# Patient Record
Sex: Male | Born: 1950 | Race: White | Hispanic: No | Marital: Single | Smoking: Current every day smoker
Health system: Southern US, Community
[De-identification: ages and names within clinical notes are randomized; demographics above are authoritative.]

## PROBLEM LIST (undated history)

## (undated) DIAGNOSIS — E119 Type 2 diabetes mellitus without complications: Secondary | ICD-10-CM

## (undated) DIAGNOSIS — Z86718 Personal history of other venous thrombosis and embolism: Secondary | ICD-10-CM

## (undated) DIAGNOSIS — E785 Hyperlipidemia, unspecified: Secondary | ICD-10-CM

## (undated) DIAGNOSIS — I82409 Acute embolism and thrombosis of unspecified deep veins of unspecified lower extremity: Secondary | ICD-10-CM

## (undated) HISTORY — DX: Type 2 diabetes mellitus without complications: E11.9

## (undated) HISTORY — DX: Hyperlipidemia, unspecified: E78.5

## (undated) HISTORY — DX: Acute embolism and thrombosis of unspecified deep veins of unspecified lower extremity: I82.409

## (undated) HISTORY — PX: TONSILLECTOMY: SUR1361

## (undated) HISTORY — DX: Personal history of other venous thrombosis and embolism: Z86.718

---

## 2011-03-27 ENCOUNTER — Emergency Department: Payer: Self-pay | Admitting: *Deleted

## 2011-03-31 ENCOUNTER — Emergency Department: Payer: Self-pay | Admitting: Internal Medicine

## 2011-09-02 ENCOUNTER — Ambulatory Visit: Payer: Self-pay

## 2012-07-10 IMAGING — CR DG CHEST 2V
1 series · 2 of 2 positions shown · non-contrast
Comparison: none

REASON FOR EXAM: sob
COMMENTS:   May transport without cardiac monitor

[Series 1: view not recorded · 0.17mm/px · 2 of 2 slices shown]
[im 1/2]
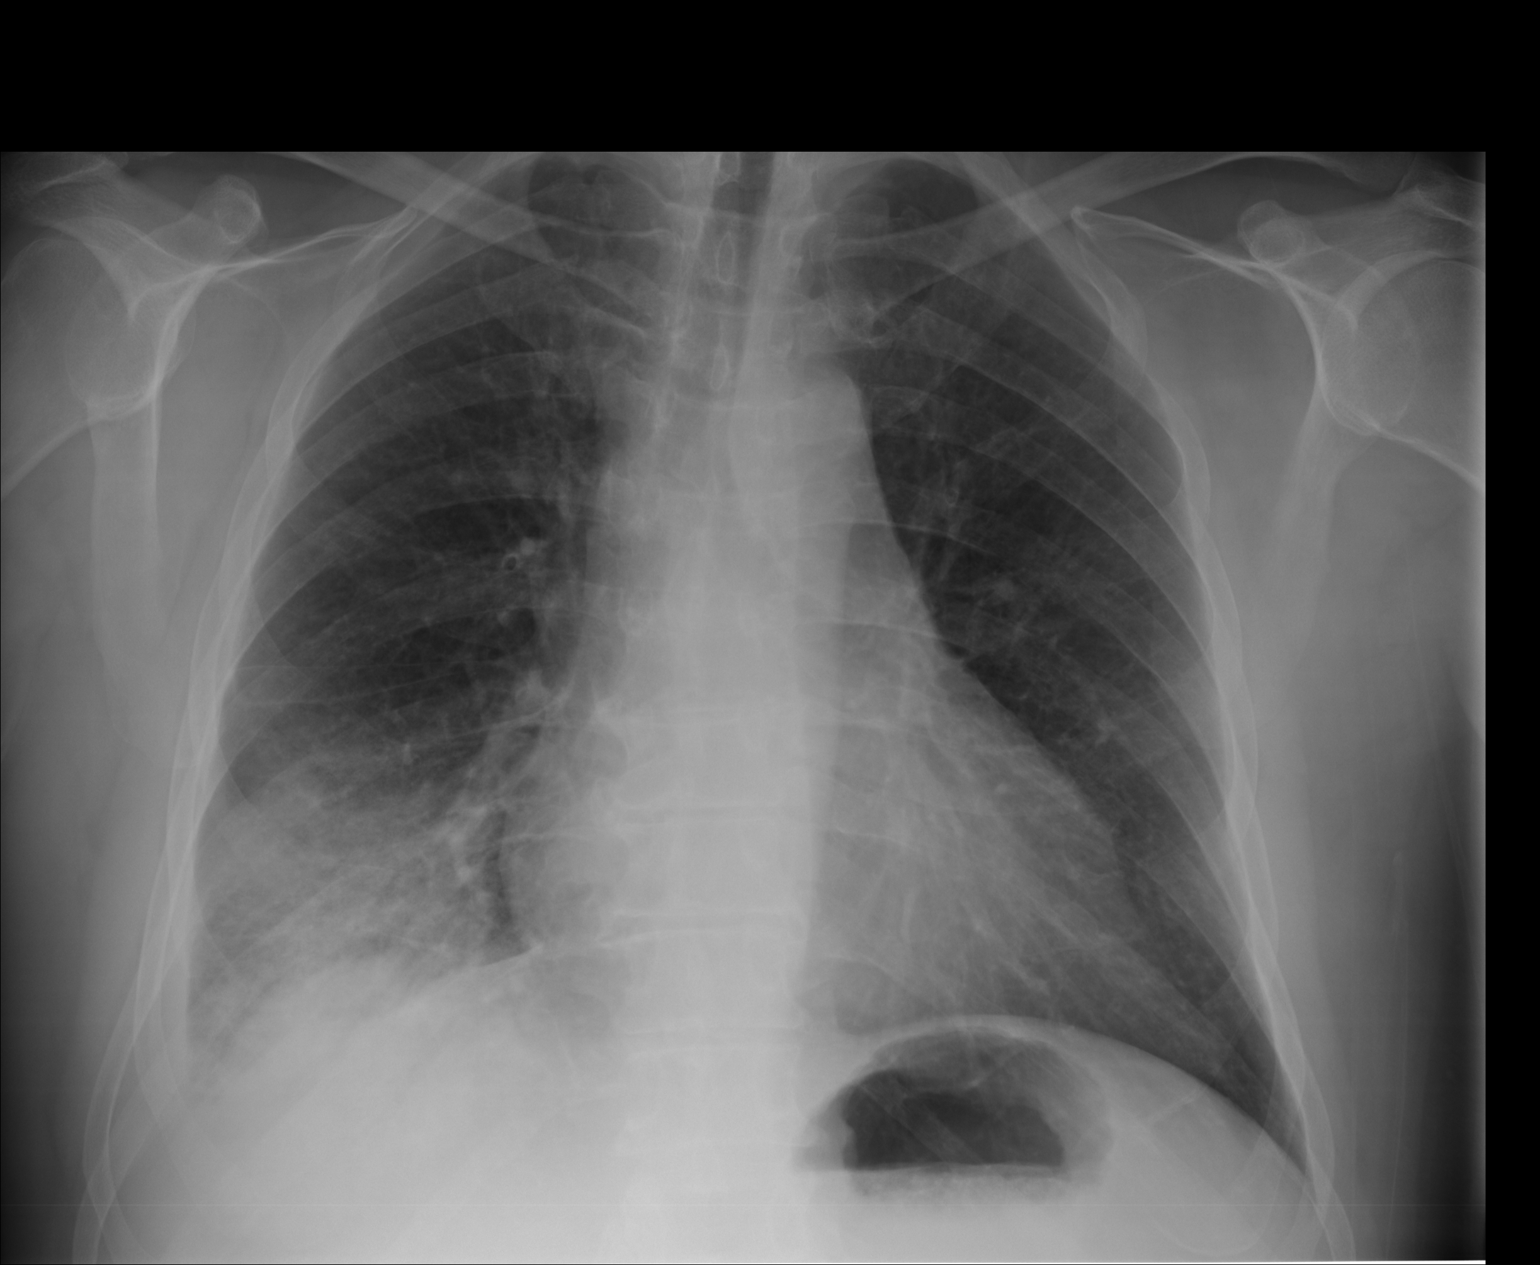
[im 2/2]
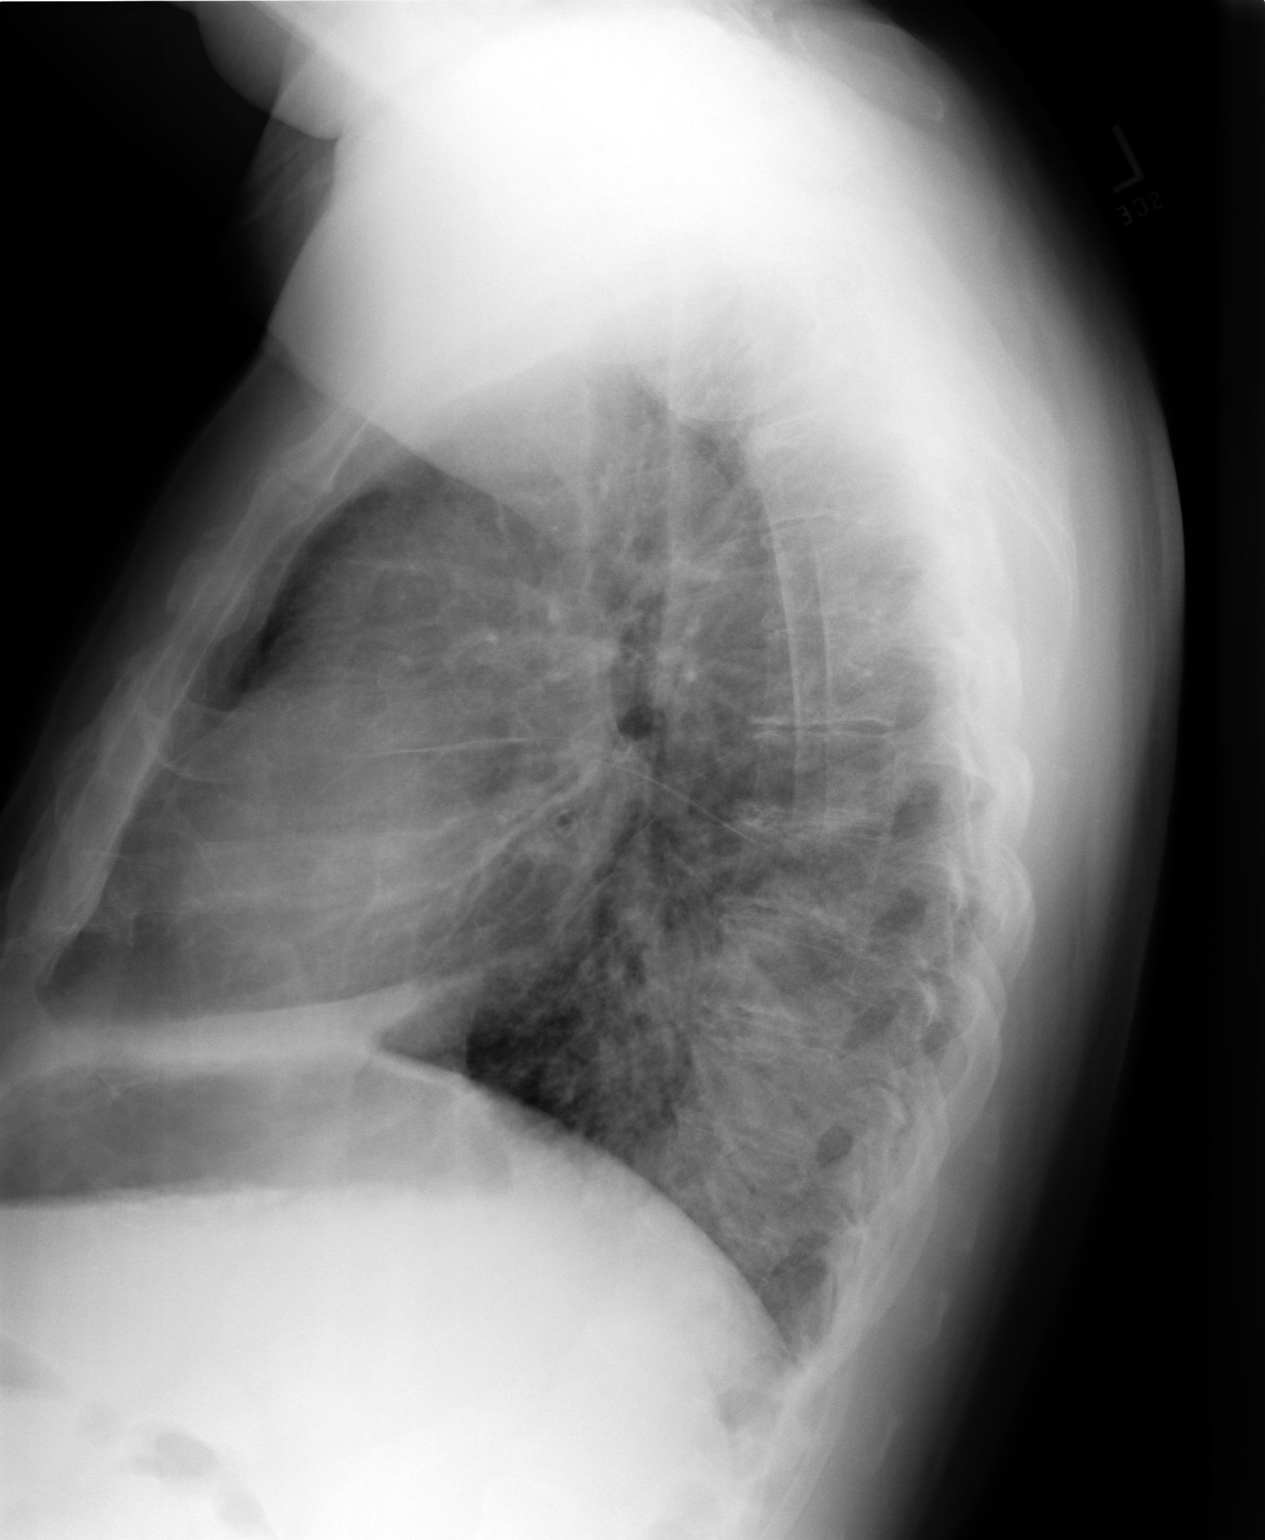

[2 of 2 positions shown; findings below may reference images not displayed]

PROCEDURE:     DXR - DXR CHEST PA (OR AP) AND LATERAL  - March 31, 2011  [DATE]

RESULT:     Comparison is made to the prior exam of 03/27/2011.

There is again noted a consolidated infiltrate in the right lower lobe
compatible with pneumonia. The infiltrate appears slightly less dense on the
prior exam. No new infiltrates are seen. The minimal left lower lobe
infiltrate has now cleared. The heart size is upper limits for normal but
stable as compared to the prior exam.
IMPRESSION: 1.  There is improvement in the previously noted right lower lobe infiltrate
compatible with pneumonia.
2.  The left lower lobe infiltrate has now cleared.
3.  In the current exam there is noted increased density at the right base
that likely represents effusion associated with the right lower lobe
infiltrate already mentioned.
4.  No new pulmonary infiltrates are seen.

## 2015-01-23 ENCOUNTER — Other Ambulatory Visit: Payer: Self-pay | Admitting: Family Medicine

## 2015-02-25 ENCOUNTER — Other Ambulatory Visit: Payer: Self-pay | Admitting: Family Medicine

## 2015-03-16 ENCOUNTER — Ambulatory Visit (INDEPENDENT_AMBULATORY_CARE_PROVIDER_SITE_OTHER): Payer: Self-pay | Admitting: Family Medicine

## 2015-03-16 ENCOUNTER — Encounter: Payer: Self-pay | Admitting: Family Medicine

## 2015-03-16 VITALS — BP 100/63 | HR 76 | Temp 98.0°F | Resp 16 | Ht 73.0 in | Wt 228.0 lb

## 2015-03-16 DIAGNOSIS — I10 Essential (primary) hypertension: Secondary | ICD-10-CM

## 2015-03-16 DIAGNOSIS — E1169 Type 2 diabetes mellitus with other specified complication: Secondary | ICD-10-CM | POA: Insufficient documentation

## 2015-03-16 DIAGNOSIS — E118 Type 2 diabetes mellitus with unspecified complications: Secondary | ICD-10-CM | POA: Insufficient documentation

## 2015-03-16 DIAGNOSIS — E119 Type 2 diabetes mellitus without complications: Secondary | ICD-10-CM

## 2015-03-16 DIAGNOSIS — E785 Hyperlipidemia, unspecified: Secondary | ICD-10-CM

## 2015-03-16 LAB — POCT GLYCOSYLATED HEMOGLOBIN (HGB A1C): Hemoglobin A1C: 6.4

## 2015-03-16 MED ORDER — LISINOPRIL 10 MG PO TABS: 10.0000 mg | ORAL_TABLET | Freq: Every day | ORAL | Status: DC

## 2015-03-16 NOTE — Progress Notes (Signed)
Name: Corey Howard   MRN: [id number]    DOB: July 18, 1951   Date:03/16/2015       Progress Note  Subjective  Chief Complaint  Chief Complaint  Patient presents with  . Diabetes    HPI  Here for f/u of DM, HBP, elevated lipids.  He has been walking some extra, but diet still needs improvement.  Has gained 5# since last visit. No problem-specific assessment & plan notes found for this encounter.   Past Medical History  Diagnosis Date  . Hypertension   . Hyperlipidemia   . Diabetes mellitus without complication     History reviewed. No pertinent past surgical history.  Family History  Problem Relation Age of Onset  . Heart attack Mother     History   Social History  . Marital Status: Single    Spouse Name: N/A  . Number of Children: N/A  . Years of Education: N/A   Occupational History  . Not on file.   Social History Main Topics  . Smoking status: Current Every Day Smoker -- 0.50 packs/day    Types: Cigarettes  . Smokeless tobacco: Never Used  . Alcohol Use: No  . Drug Use: No  . Sexual Activity: Not on file   Other Topics Concern  . Not on file   Social History Narrative  . No narrative on file     Current outpatient prescriptions:  .  aspirin 81 MG tablet, Take 81 mg by mouth daily., Disp: , Rfl:  .  atorvastatin (LIPITOR) 20 MG tablet, Take 20 mg by mouth daily., Disp: , Rfl:  .  glimepiride (AMARYL) 2 MG tablet, Take 2 mg by mouth daily with breakfast., Disp: , Rfl:  .  lisinopril (PRINIVIL,ZESTRIL) 20 MG tablet, Take 20 mg by mouth daily., Disp: , Rfl:  .  metFORMIN (GLUCOPHAGE) 500 MG tablet, Take 500 mg by mouth 2 (two) times daily with a meal., Disp: , Rfl:   Allergies  Allergen Reactions  . Penicillin G Other (See Comments)     Review of Systems  Constitutional: Negative for fever, chills, weight loss and malaise/fatigue.  HENT: Negative for hearing loss and nosebleeds.   Eyes: Negative for blurred vision and double vision.  Respiratory:  Negative for cough, sputum production, shortness of breath and wheezing.   Cardiovascular: Negative for chest pain, palpitations, orthopnea and leg swelling.  Gastrointestinal: Negative for heartburn, nausea, vomiting, abdominal pain, diarrhea and blood in stool.  Genitourinary: Negative for dysuria, urgency and frequency.  Musculoskeletal: Negative for myalgias and joint pain.  Skin: Negative for rash.  Neurological: Negative for dizziness, tingling, sensory change, focal weakness, weakness and headaches.  Psychiatric/Behavioral: Negative for depression. The patient is not nervous/anxious.       Objective  Filed Vitals:   03/16/15 0937  BP: 100/63  Pulse: 76  Temp: 98 F (36.7 C)  Resp: 16  Height:  (1.854 m)  Weight: 228 lb (103.42 kg)    Physical Exam  Constitutional: He is oriented to person, place, and time and well-developed, well-nourished, and in no distress. No distress.  HENT:  Head: Normocephalic and atraumatic.  Eyes: Conjunctivae and EOM are normal. Pupils are equal, round, and reactive to light. No scleral icterus.  Neck: Normal range of motion. Neck supple. No thyromegaly present.  Cardiovascular: Normal rate, regular rhythm, normal heart sounds and intact distal pulses.  Exam reveals no gallop and no friction rub.   No murmur heard. Pulmonary/Chest: Effort normal and breath sounds normal.  No respiratory distress. He has no wheezes. He has no rales.  Abdominal: Soft. Bowel sounds are normal. He exhibits no distension and no mass. There is no tenderness.  Musculoskeletal: Normal range of motion. He exhibits no edema.  Lymphadenopathy:    He has no cervical adenopathy.  Neurological: He is alert and oriented to person, place, and time.  Vitals reviewed.      No results found for this or any previous visit (from the past 2160 hour(s)).   Assessment & Plan  Problem List Items Addressed This Visit      Cardiovascular and Mediastinum   Hypertension    Relevant Medications   atorvastatin (LIPITOR) 20 MG tablet   aspirin 81 MG tablet   lisinopril (PRINIVIL,ZESTRIL) 20 MG tablet     Endocrine   Diabetes mellitus - Primary   Relevant Medications   glimepiride (AMARYL) 2 MG tablet   atorvastatin (LIPITOR) 20 MG tablet   metFORMIN (GLUCOPHAGE) 500 MG tablet   aspirin 81 MG tablet   lisinopril (PRINIVIL,ZESTRIL) 20 MG tablet   Other Relevant Orders   POCT HgB A1C     Other   Hyperlipemia   Relevant Medications   atorvastatin (LIPITOR) 20 MG tablet   aspirin 81 MG tablet   lisinopril (PRINIVIL,ZESTRIL) 20 MG tablet      Meds ordered this encounter  Medications  . glimepiride (AMARYL) 2 MG tablet    Sig: Take 2 mg by mouth daily with breakfast.  . atorvastatin (LIPITOR) 20 MG tablet    Sig: Take 20 mg by mouth daily.  . metFORMIN (GLUCOPHAGE) 500 MG tablet    Sig: Take 500 mg by mouth 2 (two) times daily with a meal.  . aspirin 81 MG tablet    Sig: Take 81 mg by mouth daily.  Marland Kitchen lisinopril (PRINIVIL,ZESTRIL) 20 MG tablet    Sig: Take 20 mg by mouth daily.   1. Type 2 diabetes mellitus without complication  - POCT HgB A1C-6.4  2. Essential hypertension  - lisinopril (PRINIVIL,ZESTRIL) 10 MG tablet; Take 1 tablet (10 mg total) by mouth daily.  Dispense: 90 tablet; Refill: 3  3. Hyperlipemia

## 2015-03-16 NOTE — Patient Instructions (Signed)
Continue other meds except for the reduced dose of Lisinopril.    Lose weight and reduce calories.

## 2015-05-09 ENCOUNTER — Ambulatory Visit (INDEPENDENT_AMBULATORY_CARE_PROVIDER_SITE_OTHER): Payer: Self-pay

## 2015-05-09 DIAGNOSIS — Z23 Encounter for immunization: Secondary | ICD-10-CM

## 2015-06-04 ENCOUNTER — Other Ambulatory Visit: Payer: Self-pay | Admitting: Family Medicine

## 2015-06-20 ENCOUNTER — Ambulatory Visit (INDEPENDENT_AMBULATORY_CARE_PROVIDER_SITE_OTHER): Payer: Self-pay | Admitting: Family Medicine

## 2015-06-20 ENCOUNTER — Encounter: Payer: Self-pay | Admitting: Family Medicine

## 2015-06-20 VITALS — BP 117/71 | HR 73 | Temp 97.9°F | Resp 16 | Ht 74.0 in | Wt 230.0 lb

## 2015-06-20 DIAGNOSIS — E119 Type 2 diabetes mellitus without complications: Secondary | ICD-10-CM

## 2015-06-20 DIAGNOSIS — I10 Essential (primary) hypertension: Secondary | ICD-10-CM

## 2015-06-20 DIAGNOSIS — E785 Hyperlipidemia, unspecified: Secondary | ICD-10-CM

## 2015-06-20 LAB — POCT GLYCOSYLATED HEMOGLOBIN (HGB A1C): Hemoglobin A1C: 7.4

## 2015-06-20 MED ORDER — LISINOPRIL 5 MG PO TABS: 5.0000 mg | ORAL_TABLET | Freq: Every day | ORAL | Status: DC

## 2015-06-20 MED ORDER — GLIMEPIRIDE 4 MG PO TABS: 4.0000 mg | ORAL_TABLET | Freq: Every day | ORAL | Status: DC

## 2015-06-20 NOTE — Progress Notes (Signed)
Name: Corey Howard D Fausto   MRN: [id number]    DOB: 08/16/1950   Date:06/20/2015       Progress Note  Subjective  Chief Complaint  Chief Complaint  Patient presents with  . Diabetes    Diabetes Pertinent negatives for hypoglycemia include no dizziness, headaches or tremors. Pertinent negatives for diabetes include no blurred vision, no chest pain, no weakness and no weight loss.   Here for f/u of DM.  He has gained weight.  He stopped Lisinopril about 3 days ago because of fear about kidney failure.   Fasting BSs running 115-130s when checked.  No problem-specific assessment & plan notes found for this encounter.   Past Medical History  Diagnosis Date  . Hypertension   . Hyperlipidemia   . Diabetes mellitus without complication [facility](HCC)     Social History  Substance Use Topics  . Smoking status: Current Every Day Smoker -- 0.50 packs/day    Types: Cigarettes  . Smokeless tobacco: Never Used  . Alcohol Use: No     Current outpatient prescriptions:  .  aspirin 81 MG tablet, Take 81 mg by mouth daily., Disp: , Rfl:  .  atorvastatin (LIPITOR) 20 MG tablet, TAKE ONE TABLET BY MOUTH AT BEDTIME, Disp: 30 tablet, Rfl: 12 .  glimepiride (AMARYL) 2 MG tablet, Take 2 mg by mouth daily with breakfast., Disp: , Rfl:  .  metFORMIN (GLUCOPHAGE) 500 MG tablet, TAKE TWO TABLETS BY MOUTH TWICE DAILY, Disp: 120 tablet, Rfl: 12 .  lisinopril (PRINIVIL,ZESTRIL) 10 MG tablet, Take 1 tablet (10 mg total) by mouth daily. (Patient not taking: Reported on 06/20/2015), Disp: 90 tablet, Rfl: 3  Allergies  Allergen Reactions  . Penicillin G Other (See Comments)    Review of Systems  Constitutional: Negative for fever, chills, weight loss and malaise/fatigue.  HENT: Negative for hearing loss.   Eyes: Negative for blurred vision and double vision.  Respiratory: Negative for cough, sputum production, shortness of breath and wheezing.   Cardiovascular: Negative for chest pain, palpitations and leg swelling.   Gastrointestinal: Negative for heartburn, abdominal pain and blood in stool.  Genitourinary: Negative for dysuria, urgency and frequency.  Musculoskeletal: Negative for myalgias and joint pain.  Skin: Negative for rash.  Neurological: Negative for dizziness, tremors, weakness and headaches.      Objective  Filed Vitals:   06/20/15 0835  BP: 117/71  Pulse: 73  Temp: 97.9 F (36.6 C)  TempSrc: Oral  Resp: 16  Height: 6\' 2"  (1.88 m)  Weight: 230 lb (104.327 kg)     Physical Exam  Constitutional: He is oriented to person, place, and time and well-developed, well-nourished, and in no distress. No distress.  HENT:  Head: Normocephalic and atraumatic.  Eyes: Conjunctivae and EOM are normal. Pupils are equal, round, and reactive to light. No scleral icterus.  Neck: Normal range of motion. Neck supple. Carotid bruit is not present. No thyromegaly present.  Cardiovascular: Normal rate, regular rhythm, normal heart sounds and intact distal pulses.  Exam reveals no gallop and no friction rub.   No murmur heard. Pulmonary/Chest: Effort normal and breath sounds normal. No respiratory distress. He has no wheezes. He has no rales.  Abdominal: Soft. Bowel sounds are normal. He exhibits no distension, no abdominal bruit and no mass. There is no tenderness.  Musculoskeletal: He exhibits no edema.  Lymphadenopathy:    He has no cervical adenopathy.  Neurological: He is alert and oriented to person, place, and time.  Vitals reviewed.  No results found for this or any previous visit (from the past 2160 hour(s)).   Assessment & Plan  1. Type 2 diabetes mellitus without complication, without long-term current use of insulin (HCC)  - POCT HgB A1C-7.4 - lisinopril (PRINIVIL,ZESTRIL) 5 MG tablet; Take 1 tablet (5 mg total) by mouth daily.  Dispense: 30 tablet; Refill: 6 - glimepiride (AMARYL) 4 MG tablet; Take 1 tablet (4 mg total) by mouth daily before breakfast.  Dispense: 30 tablet;  Refill: 6 -cont. Metformin  2. Essential hypertension  - lisinopril (PRINIVIL,ZESTRIL) 5 MG tablet; Take 1 tablet (5 mg total) by mouth daily.  Dispense: 30 tablet; Refill: 6  3. Hyperlipemia -cont. Atorvastatin

## 2015-06-20 NOTE — Patient Instructions (Signed)
Discussed weight loss for sugar control.  Discussed stopping smoking again.

## 2015-10-01 ENCOUNTER — Ambulatory Visit (INDEPENDENT_AMBULATORY_CARE_PROVIDER_SITE_OTHER): Payer: Self-pay | Admitting: Family Medicine

## 2015-10-01 ENCOUNTER — Encounter: Payer: Self-pay | Admitting: Family Medicine

## 2015-10-01 VITALS — BP 135/70 | HR 71 | Temp 98.2°F | Resp 16 | Ht 74.0 in | Wt 234.0 lb

## 2015-10-01 DIAGNOSIS — E119 Type 2 diabetes mellitus without complications: Secondary | ICD-10-CM

## 2015-10-01 DIAGNOSIS — E785 Hyperlipidemia, unspecified: Secondary | ICD-10-CM

## 2015-10-01 DIAGNOSIS — I10 Essential (primary) hypertension: Secondary | ICD-10-CM

## 2015-10-01 DIAGNOSIS — L219 Seborrheic dermatitis, unspecified: Secondary | ICD-10-CM

## 2015-10-01 LAB — POCT GLYCOSYLATED HEMOGLOBIN (HGB A1C): Hemoglobin A1C: 7.2

## 2015-10-01 MED ORDER — HYDROCORTISONE 1 % EX OINT: 1.0000 "application " | TOPICAL_OINTMENT | Freq: Two times a day (BID) | CUTANEOUS | Status: DC

## 2015-10-01 MED ORDER — GLIMEPIRIDE 4 MG PO TABS: ORAL_TABLET | ORAL | Status: DC

## 2015-10-01 NOTE — Progress Notes (Signed)
Name: Corey Howard   MRN: [id number]    DOB: 01-14-1951   Date:10/01/2015       Progress Note  Subjective  Chief Complaint  Chief Complaint  Patient presents with  . Diabetes  . Eczema    dry place behind right ear.    HPI Here for f/u of DM, HBP, elevated lipids. Also c/o dry skin behind R ear.  Comes and goes.  It is much better now than before.  BSs at home running 130-140 fasting never checked post prandial.  No problem-specific assessment & plan notes found for this encounter.   Past Medical History  Diagnosis Date  . Hypertension   . Hyperlipidemia   . Diabetes mellitus without complication (HCC)     History reviewed. No pertinent past surgical history.  Family History  Problem Relation Age of Onset  . Heart attack Mother     Social History   Social History  . Marital Status: Single    Spouse Name: N/A  . Number of Children: N/A  . Years of Education: N/A   Occupational History  . Not on file.   Social History Main Topics  . Smoking status: Current Every Day Smoker -- 0.50 packs/day    Types: Cigarettes  . Smokeless tobacco: Never Used  . Alcohol Use: No  . Drug Use: No  . Sexual Activity: Not on file   Other Topics Concern  . Not on file   Social History Narrative     Current outpatient prescriptions:  .  aspirin 81 MG tablet, Take 81 mg by mouth daily., Disp: , Rfl:  .  atorvastatin (LIPITOR) 20 MG tablet, TAKE ONE TABLET BY MOUTH AT BEDTIME, Disp: 30 tablet, Rfl: 12 .  glimepiride (AMARYL) 4 MG tablet, Take 1.5 tablets each AM., Disp: 45 tablet, Rfl: 6 .  lisinopril (PRINIVIL,ZESTRIL) 5 MG tablet, Take 1 tablet (5 mg total) by mouth daily., Disp: 30 tablet, Rfl: 6 .  metFORMIN (GLUCOPHAGE) 500 MG tablet, TAKE TWO TABLETS BY MOUTH TWICE DAILY, Disp: 120 tablet, Rfl: 12 .  hydrocortisone 1 % ointment, Apply 1 application topically 2 (two) times daily., Disp: 30 g, Rfl: 0  Allergies  Allergen Reactions  . Penicillin G Other (See Comments)      Review of Systems  Constitutional: Negative for fever, chills, weight loss and malaise/fatigue.  HENT: Negative for hearing loss.   Eyes: Negative for blurred vision and double vision.  Respiratory: Negative for cough, shortness of breath and wheezing.   Cardiovascular: Negative for chest pain, palpitations and leg swelling.  Gastrointestinal: Negative for heartburn, abdominal pain and blood in stool.  Genitourinary: Negative for dysuria, urgency and frequency.  Musculoskeletal: Negative for myalgias and joint pain.  Skin: Positive for rash (b ehind R ear.).  Neurological: Negative for dizziness, tremors, weakness and headaches.      Objective  Filed Vitals:   10/01/15 0831 10/01/15 0856  BP: 144/81 135/70  Pulse: 71   Temp: 98.2 F (36.8 C)   TempSrc: Oral   Resp: 16   Height:  (1.88 m)   Weight: 234 lb (106.142 kg)     Physical Exam  Constitutional: He is oriented to person, place, and time and well-developed, well-nourished, and in no distress. No distress.  HENT:  Head: Normocephalic and atraumatic.  Eyes: Conjunctivae and EOM are normal. Pupils are equal, round, and reactive to light. No scleral icterus.  Neck: Normal range of motion. Neck supple. Carotid bruit is not present. No thyromegaly  present.  Cardiovascular: Normal rate, regular rhythm and normal heart sounds.  Exam reveals no gallop and no friction rub.   No murmur heard. Pulmonary/Chest: Effort normal and breath sounds normal. No respiratory distress. He has no wheezes. He has no rales.  Abdominal: Bowel sounds are normal. He exhibits no distension, no abdominal bruit and no mass. There is no tenderness.  Musculoskeletal: He exhibits no edema.  Lymphadenopathy:    He has no cervical adenopathy.  Neurological: He is alert and oriented to person, place, and time.  Skin: Rash noted. Pallor: plaque of seborrhea bhind R ear.  Vitals reviewed.      Recent Results (from the past 2160 hour(s))   POCT HgB A1C     Status: Abnormal   Collection Time: 10/01/15  8:44 AM  Result Value Ref Range   Hemoglobin A1C 7.2      Assessment & Plan  Problem List Items Addressed This Visit      Cardiovascular and Mediastinum   Hypertension     Endocrine   Diabetes mellitus (HCC) - Primary   Relevant Medications   glimepiride (AMARYL) 4 MG tablet   Other Relevant Orders   POCT HgB A1C (Completed)   [facility] Diabetes Foot Exam (Completed)   Comprehensive Metabolic Panel (CMET)   CBC with Differential     Musculoskeletal and Integument   Seborrhea   Relevant Medications   hydrocortisone 1 % ointment     Other   Hyperlipemia   Relevant Orders   Lipid Profile      Meds ordered this encounter  Medications  . glimepiride (AMARYL) 4 MG tablet    Sig: Take 1.5 tablets each AM.    Dispense:  45 tablet    Refill:  6  . hydrocortisone 1 % ointment    Sig: Apply 1 application topically 2 (two) times daily.    Dispense:  30 g    Refill:  0  1. Type 2 diabetes mellitus without complication, without long-term current use of insulin (HCC)  - POCT HgB A1C-7.2 - [facility] Diabetes Foot Exam-completed - glimepiride (AMARYL) 4 MG tablet; Take 1.5 tablets each AM.  Dispense: 45 tablet; Refill: 6 - Comprehensive Metabolic Panel (CMET) - CBC with Differential -cont Metformin 2. Essential hypertension -Cont Lisinopril  3. Hyperlipemia -Cont. Lipitor - Lipid Profile  4. Seborrhea  - hydrocortisone 1 % ointment; Apply 1 application topically 2 (two) times daily.  Dispense: 30 g; Refill: 0

## 2015-10-10 LAB — COMPREHENSIVE METABOLIC PANEL
ALBUMIN: 4.4 g/dL (ref 3.6–4.8)
ALK PHOS: 106 IU/L (ref 39–117)
ALT: 24 IU/L (ref 0–44)
AST: 18 IU/L (ref 0–40)
Albumin/Globulin Ratio: 1.9 (ref 1.1–2.5)
BUN / CREAT RATIO: 15 (ref 10–22)
BUN: 18 mg/dL (ref 8–27)
Bilirubin Total: 0.2 mg/dL (ref 0.0–1.2)
CO2: 21 mmol/L (ref 18–29)
CREATININE: 1.21 mg/dL (ref 0.76–1.27)
Calcium: 9.6 mg/dL (ref 8.6–10.2)
Chloride: 99 mmol/L (ref 96–106)
GFR calc Af Amer: 74 mL/min/{1.73_m2} (ref >59)
GFR calc non Af Amer: 64 mL/min/{1.73_m2} (ref >59)
GLOBULIN, TOTAL: 2.3 g/dL (ref 1.5–4.5)
Glucose: 136 mg/dL — ABNORMAL HIGH (ref 65–99)
Potassium: 5.4 mmol/L — ABNORMAL HIGH (ref 3.5–5.2)
SODIUM: 140 mmol/L (ref 134–144)
Total Protein: 6.7 g/dL (ref 6.0–8.5)

## 2015-10-10 LAB — CBC WITH DIFFERENTIAL/PLATELET
BASOS ABS: 0 10*3/uL (ref 0.0–0.2)
Basos: 0 %
EOS (ABSOLUTE): 0.3 10*3/uL (ref 0.0–0.4)
Eos: 3 %
Hematocrit: 45.2 % (ref 37.5–51.0)
Hemoglobin: 15.9 g/dL (ref 12.6–17.7)
IMMATURE GRANULOCYTES: 0 %
Immature Grans (Abs): 0 10*3/uL (ref 0.0–0.1)
Lymphocytes Absolute: 2.4 10*3/uL (ref 0.7–3.1)
Lymphs: 23 %
MCH: 29.5 pg (ref 26.6–33.0)
MCHC: 35.2 g/dL (ref 31.5–35.7)
MCV: 84 fL (ref 79–97)
MONOS ABS: 1.1 10*3/uL — AB (ref 0.1–0.9)
Monocytes: 11 %
NEUTROS PCT: 63 %
Neutrophils Absolute: 6.5 10*3/uL (ref 1.4–7.0)
PLATELETS: 300 10*3/uL (ref 150–379)
RBC: 5.39 x10E6/uL (ref 4.14–5.80)
RDW: 13.9 % (ref 12.3–15.4)
WBC: 10.4 10*3/uL (ref 3.4–10.8)

## 2015-10-10 LAB — LIPID PANEL
CHOL/HDL RATIO: 4.2 ratio (ref 0.0–5.0)
CHOLESTEROL TOTAL: 152 mg/dL (ref 100–199)
HDL: 36 mg/dL — ABNORMAL LOW (ref >39)
LDL Calculated: 78 mg/dL (ref 0–99)
Triglycerides: 192 mg/dL — ABNORMAL HIGH (ref 0–149)
VLDL Cholesterol Cal: 38 mg/dL (ref 5–40)

## 2016-01-08 ENCOUNTER — Ambulatory Visit: Payer: Self-pay | Admitting: Family Medicine

## 2016-02-18 ENCOUNTER — Other Ambulatory Visit: Payer: Self-pay | Admitting: Family Medicine

## 2016-02-21 ENCOUNTER — Ambulatory Visit (INDEPENDENT_AMBULATORY_CARE_PROVIDER_SITE_OTHER): Payer: Self-pay | Admitting: Family Medicine

## 2016-02-21 ENCOUNTER — Encounter: Payer: Self-pay | Admitting: Family Medicine

## 2016-02-21 VITALS — BP 128/74 | HR 71 | Temp 98.0°F | Resp 16 | Wt 236.0 lb

## 2016-02-21 DIAGNOSIS — E785 Hyperlipidemia, unspecified: Secondary | ICD-10-CM

## 2016-02-21 DIAGNOSIS — E119 Type 2 diabetes mellitus without complications: Secondary | ICD-10-CM

## 2016-02-21 DIAGNOSIS — I10 Essential (primary) hypertension: Secondary | ICD-10-CM

## 2016-02-21 LAB — POCT GLYCOSYLATED HEMOGLOBIN (HGB A1C): Hemoglobin A1C: 7.6

## 2016-02-21 MED ORDER — GLIMEPIRIDE 4 MG PO TABS: ORAL_TABLET | ORAL | Status: DC

## 2016-02-21 NOTE — Progress Notes (Signed)
Name: Corey ReamerLewis D Howard   MRN: [id number]    DOB: 01/15/1951   Date:02/21/2016       Progress Note  Subjective  Chief Complaint  Chief Complaint  Patient presents with  . Diabetes    HPI  Here for f/u of DM and HBP.  Taking meds.  He has gained some weight recently. No problem-specific assessment & plan notes found for this encounter.   Past Medical History  Diagnosis Date  . Hypertension   . Hyperlipidemia   . Diabetes mellitus without complication (HCC)     History reviewed. No pertinent past surgical history.  Family History  Problem Relation Age of Onset  . Heart attack Mother     Social History   Social History  . Marital Status: Single    Spouse Name: N/A  . Number of Children: N/A  . Years of Education: N/A   Occupational History  . Not on file.   Social History Main Topics  . Smoking status: Current Every Day Smoker -- 0.50 packs/day    Types: Cigarettes  . Smokeless tobacco: Never Used  . Alcohol Use: No  . Drug Use: No  . Sexual Activity: Not on file   Other Topics Concern  . Not on file   Social History Narrative     Current outpatient prescriptions:  .  aspirin 81 MG tablet, Take 81 mg by mouth daily., Disp: , Rfl:  .  atorvastatin (LIPITOR) 20 MG tablet, TAKE ONE TABLET BY MOUTH AT BEDTIME, Disp: 30 tablet, Rfl: 12 .  glimepiride (AMARYL) 4 MG tablet, Take 2 tablets each AM., Disp: 180 tablet, Rfl: 3 .  hydrocortisone 1 % ointment, Apply 1 application topically 2 (two) times daily., Disp: 30 g, Rfl: 0 .  lisinopril (PRINIVIL,ZESTRIL) 5 MG tablet, TAKE ONE TABLET BY MOUTH ONCE DAILY, Disp: 30 tablet, Rfl: 12 .  metFORMIN (GLUCOPHAGE) 500 MG tablet, TAKE TWO TABLETS BY MOUTH TWICE DAILY, Disp: 120 tablet, Rfl: 12  Allergies  Allergen Reactions  . Penicillin G Other (See Comments)     Review of Systems  Constitutional: Negative for fever, chills, weight loss and malaise/fatigue.  HENT: Negative for hearing loss.   Eyes: Negative for  blurred vision and double vision.  Respiratory: Negative for cough, shortness of breath and wheezing.   Cardiovascular: Negative for chest pain, palpitations and leg swelling.  Gastrointestinal: Negative for heartburn, abdominal pain and blood in stool.  Genitourinary: Negative for dysuria, urgency and frequency.  Musculoskeletal: Negative for myalgias and joint pain.  Skin: Negative for rash.  Neurological: Negative for dizziness, tremors, weakness and headaches.      Objective  Filed Vitals:   02/21/16 0948  BP: 128/74  Pulse: 71  Temp: 98 F (36.7 C)  TempSrc: Oral  Resp: 16  Weight: 236 lb (107.049 kg)    Physical Exam  Constitutional: He is oriented to person, place, and time and well-developed, well-nourished, and in no distress. No distress.  HENT:  Head: Normocephalic and atraumatic.  Eyes: Conjunctivae and EOM are normal. Pupils are equal, round, and reactive to light. No scleral icterus.  Neck: Normal range of motion. Neck supple. Carotid bruit is not present. No thyromegaly present.  Cardiovascular: Normal rate, regular rhythm and normal heart sounds.  Exam reveals no gallop and no friction rub.   No murmur heard. Pulmonary/Chest: Effort normal and breath sounds normal. No respiratory distress. He has no wheezes. He has no rales.  Abdominal: Soft. Bowel sounds are normal. He exhibits no  distension. There is no tenderness. There is no guarding.  Musculoskeletal: He exhibits no edema.  Lymphadenopathy:    He has no cervical adenopathy.  Neurological: He is alert and oriented to person, place, and time.  Vitals reviewed.      Recent Results (from the past 2160 hour(s))  POCT HgB A1C     Status: Abnormal   Collection Time: 02/21/16  9:53 AM  Result Value Ref Range   Hemoglobin A1C 7.6%      Assessment & Plan  Problem List Items Addressed This Visit      Cardiovascular and Mediastinum   Hypertension     Endocrine   Diabetes mellitus (HCC) - Primary    Relevant Medications   glimepiride (AMARYL) 4 MG tablet   Other Relevant Orders   POCT HgB A1C (Completed)     Other   Hyperlipemia      Meds ordered this encounter  Medications  . glimepiride (AMARYL) 4 MG tablet    Sig: Take 2 tablets each AM.    Dispense:  180 tablet    Refill:  3   1. Type 2 diabetes mellitus without complication, without long-term current use of insulin (HCC)  - POCT HgB A1C-7.6 - glimepiride (AMARYL) 4 MG tablet; Take 2 tablets each AM.  Dispense: 180 tablet; Refill: 3 (increased from 1.5 tablets a day). Cont Metformin 2. Essential hypertension Cont Lisinopril 3. Hyperlipemia Cont Lipitor

## 2016-05-19 LAB — HM DIABETES EYE EXAM

## 2016-05-27 ENCOUNTER — Ambulatory Visit (INDEPENDENT_AMBULATORY_CARE_PROVIDER_SITE_OTHER): Payer: Self-pay | Admitting: Family Medicine

## 2016-05-27 ENCOUNTER — Encounter: Payer: Self-pay | Admitting: Family Medicine

## 2016-05-27 VITALS — BP 135/65 | HR 80 | Temp 98.4°F | Resp 16 | Ht 74.0 in | Wt 229.0 lb

## 2016-05-27 DIAGNOSIS — E0800 Diabetes mellitus due to underlying condition with hyperosmolarity without nonketotic hyperglycemic-hyperosmolar coma (NKHHC): Secondary | ICD-10-CM

## 2016-05-27 DIAGNOSIS — E784 Other hyperlipidemia: Secondary | ICD-10-CM

## 2016-05-27 DIAGNOSIS — I10 Essential (primary) hypertension: Secondary | ICD-10-CM

## 2016-05-27 DIAGNOSIS — E7849 Other hyperlipidemia: Secondary | ICD-10-CM

## 2016-05-27 DIAGNOSIS — E119 Type 2 diabetes mellitus without complications: Secondary | ICD-10-CM

## 2016-05-27 DIAGNOSIS — Z23 Encounter for immunization: Secondary | ICD-10-CM

## 2016-05-27 LAB — POCT GLYCOSYLATED HEMOGLOBIN (HGB A1C)

## 2016-05-27 NOTE — Progress Notes (Signed)
Name: Corey Howard   MRN: [id number]    DOB: 11/23/1950   Date:05/27/2016       Progress Note  Subjective  Chief Complaint  Chief Complaint  Patient presents with  . Follow-up    Diabetes    HPI Here for f/u of DM and elevated lipids.  Has had rare elevated BP in past.  He has  Lost some weight.  His BSs at home run 110-140 fasting.  He is feeliing well overall  No problem-specific Assessment & Plan notes found for this encounter.   Past Medical History:  Diagnosis Date  . Diabetes mellitus without complication (HCC)   . Hyperlipidemia   . Hypertension     History reviewed. No pertinent surgical history.  Family History  Problem Relation Age of Onset  . Heart attack Mother     Social History   Social History  . Marital status: Single    Spouse name: N/A  . Number of children: N/A  . Years of education: N/A   Occupational History  . Not on file.   Social History Main Topics  . Smoking status: Current Every Day Smoker    Packs/day: 0.50    Types: Cigarettes  . Smokeless tobacco: Never Used  . Alcohol use No  . Drug use: No  . Sexual activity: Not on file   Other Topics Concern  . Not on file   Social History Narrative  . No narrative on file     Current Outpatient Prescriptions:  .  aspirin 81 MG tablet, Take 81 mg by mouth daily., Disp: , Rfl:  .  atorvastatin (LIPITOR) 20 MG tablet, TAKE ONE TABLET BY MOUTH AT BEDTIME, Disp: 30 tablet, Rfl: 12 .  glimepiride (AMARYL) 4 MG tablet, Take 2 tablets each AM., Disp: 180 tablet, Rfl: 3 .  hydrocortisone 1 % ointment, Apply 1 application topically 2 (two) times daily., Disp: 30 g, Rfl: 0 .  lisinopril (PRINIVIL,ZESTRIL) 5 MG tablet, TAKE ONE TABLET BY MOUTH ONCE DAILY, Disp: 30 tablet, Rfl: 12 .  metFORMIN (GLUCOPHAGE) 500 MG tablet, TAKE TWO TABLETS BY MOUTH TWICE DAILY, Disp: 120 tablet, Rfl: 12  Allergies  Allergen Reactions  . Penicillin G Other (See Comments)     Review of Systems   Constitutional: Negative for chills, fever, malaise/fatigue and weight loss.  HENT: Negative for hearing loss.   Eyes: Negative for blurred vision and double vision.  Respiratory: Negative for cough, shortness of breath and wheezing.   Cardiovascular: Negative for chest pain, palpitations and leg swelling.  Gastrointestinal: Negative for abdominal pain, blood in stool and heartburn.  Genitourinary: Negative for dysuria, frequency and urgency.  Musculoskeletal: Negative for joint pain and myalgias.  Skin: Negative for rash.  Neurological: Negative for dizziness, tremors, weakness and headaches.      Objective  Vitals:   05/27/16 0838 05/27/16 0842 05/27/16 0919  BP: (!) 162/81 (!) 147/81 135/65  Pulse: 81 80   Resp: 16    Temp: 98.4 F (36.9 C)    TempSrc: Oral    Weight: 103.9 kg (229 lb)    Height: 6\' 2"  (1.88 m)      Physical Exam  Constitutional: He is oriented to person, place, and time and well-developed, well-nourished, and in no distress. No distress.  HENT:  Head: Normocephalic and atraumatic.  Eyes: Conjunctivae and EOM are normal. Pupils are equal, round, and reactive to light. No scleral icterus.  Neck: Normal range of motion. Neck supple. Carotid bruit is not  present. No thyromegaly present.  Cardiovascular: Normal rate, regular rhythm and normal heart sounds.  Exam reveals no gallop and no friction rub.   No murmur heard. Pulmonary/Chest: Effort normal and breath sounds normal. No respiratory distress. He has no wheezes. He has no rales.  Abdominal: Soft. Bowel sounds are normal. He exhibits no distension and no mass. There is no tenderness.  Musculoskeletal: He exhibits no edema.  Lymphadenopathy:    He has no cervical adenopathy.  Neurological: He is alert and oriented to person, place, and time.  Vitals reviewed.      Recent Results (from the past 2160 hour(s))  POCT HgB A1C     Status: Abnormal   Collection Time: 05/27/16  8:47 AM  Result Value  Ref Range   Hemoglobin A1C 7.6%      Assessment & Plan  Problem List Items Addressed This Visit      Cardiovascular and Mediastinum   Hypertension     Endocrine   Diabetes mellitus (HCC)     Other   Hyperlipemia    Other Visit Diagnoses    Diabetes mellitus without complication (HCC)    -  Primary   Relevant Orders   POCT HgB A1C (Completed)   Immunization due       Relevant Orders   Flu Vaccine QUAD 36+ mos IM (Fluarix & Fluzone Quad PF      No orders of the defined types were placed in this encounter.  1. Diabetes mellitus without complication (HCC)  - POCT HgB A1C-7.6  2. Diabetes mellitus due to underlying condition with hyperosmolarity without coma, without long-term current use of insulin (HCC) Cont Metformin and Amaryl  3. Essential hypertension  Cont Lisinopril 4. Other hyperlipidemia Cont Atorvastatin  5. Immunization due  - Flu Vaccine QUAD 36+ mos IM (Fluarix & Fluzone Quad PF

## 2016-06-30 ENCOUNTER — Other Ambulatory Visit: Payer: Self-pay | Admitting: Family Medicine

## 2016-07-30 ENCOUNTER — Other Ambulatory Visit: Payer: Self-pay | Admitting: Family Medicine

## 2016-08-28 ENCOUNTER — Ambulatory Visit: Payer: Self-pay | Admitting: Family Medicine

## 2016-08-29 ENCOUNTER — Encounter: Payer: Self-pay | Admitting: Family Medicine

## 2016-08-29 ENCOUNTER — Ambulatory Visit (INDEPENDENT_AMBULATORY_CARE_PROVIDER_SITE_OTHER): Payer: Self-pay | Admitting: Family Medicine

## 2016-08-29 VITALS — BP 159/67 | HR 80 | Temp 98.6°F | Resp 16 | Ht 74.0 in | Wt 216.0 lb

## 2016-08-29 DIAGNOSIS — E784 Other hyperlipidemia: Secondary | ICD-10-CM

## 2016-08-29 DIAGNOSIS — E0800 Diabetes mellitus due to underlying condition with hyperosmolarity without nonketotic hyperglycemic-hyperosmolar coma (NKHHC): Secondary | ICD-10-CM

## 2016-08-29 DIAGNOSIS — E7849 Other hyperlipidemia: Secondary | ICD-10-CM

## 2016-08-29 DIAGNOSIS — E119 Type 2 diabetes mellitus without complications: Secondary | ICD-10-CM

## 2016-08-29 DIAGNOSIS — I10 Essential (primary) hypertension: Secondary | ICD-10-CM

## 2016-08-29 LAB — POCT GLYCOSYLATED HEMOGLOBIN (HGB A1C)

## 2016-08-29 MED ORDER — LISINOPRIL 10 MG PO TABS: 10.0000 mg | ORAL_TABLET | Freq: Every day | ORAL | 12 refills | Status: DC

## 2016-08-29 NOTE — Progress Notes (Signed)
Name: Corey Howard   MRN: [id number]    DOB: 12/27/1950   Date:08/29/2016       Progress Note  Subjective  Chief Complaint  Chief Complaint  Patient presents with  . Diabetes    HPI Here for f/u of DM.  He has lost weight and is walking ands watching his diet better.  He is taking all meds and feels well overall.  No problem-specific Assessment & Plan notes found for this encounter.   Past Medical History:  Diagnosis Date  . Diabetes mellitus without complication (HCC)   . Hyperlipidemia   . Hypertension     History reviewed. No pertinent surgical history.  Family History  Problem Relation Age of Onset  . Heart attack Mother     Social History   Social History  . Marital status: Single    Spouse name: N/A  . Number of children: N/A  . Years of education: N/A   Occupational History  . Not on file.   Social History Main Topics  . Smoking status: Current Every Day Smoker    Packs/day: 0.50    Types: Cigarettes  . Smokeless tobacco: Never Used  . Alcohol use No  . Drug use: No  . Sexual activity: Not on file   Other Topics Concern  . Not on file   Social History Narrative  . No narrative on file     Current Outpatient Prescriptions:  .  aspirin 81 MG tablet, Take 81 mg by mouth daily., Disp: , Rfl:  .  atorvastatin (LIPITOR) 20 MG tablet, TAKE ONE TABLET BY MOUTH ONCE DAILY AT BEDTIME, Disp: 30 tablet, Rfl: 12 .  glimepiride (AMARYL) 4 MG tablet, Take 2 tablets each AM., Disp: 180 tablet, Rfl: 3 .  hydrocortisone 1 % ointment, Apply 1 application topically 2 (two) times daily., Disp: 30 g, Rfl: 0 .  lisinopril (PRINIVIL,ZESTRIL) 10 MG tablet, Take 1 tablet (10 mg total) by mouth daily., Disp: 30 tablet, Rfl: 12 .  metFORMIN (GLUCOPHAGE) 500 MG tablet, TAKE TWO TABLETS BY MOUTH TWICE DAILY, Disp: 120 tablet, Rfl: 12  Allergies  Allergen Reactions  . Penicillin G Other (See Comments)     Review of Systems  Constitutional: Negative for chills,  fever, malaise/fatigue and weight loss.  HENT: Negative for hearing loss and tinnitus.   Eyes: Negative for blurred vision and double vision.  Respiratory: Negative for cough, shortness of breath and wheezing.   Cardiovascular: Negative for chest pain, palpitations and leg swelling.  Gastrointestinal: Negative for abdominal pain, blood in stool and heartburn.  Genitourinary: Negative for dysuria, frequency and urgency.  Skin: Negative for rash.  Neurological: Negative for dizziness, tingling, tremors, weakness and headaches.      Objective  Vitals:   08/29/16 1230  BP: (!) 159/67  Pulse: 80  Resp: 16  Temp: 98.6 F (37 C)  TempSrc: Oral  Weight: 216 lb (98 kg)  Height: 6\' 2"  (1.88 m)    Physical Exam  Constitutional: He is oriented to person, place, and time and well-developed, well-nourished, and in no distress. No distress.  HENT:  Head: Normocephalic and atraumatic.  Eyes: Conjunctivae and EOM are normal. Pupils are equal, round, and reactive to light. No scleral icterus.  Neck: Normal range of motion. Neck supple. No thyromegaly present.  Cardiovascular: Normal rate and regular rhythm.  Exam reveals no gallop and no friction rub.   No murmur heard. Pulmonary/Chest: Effort normal and breath sounds normal. No respiratory distress. He has no  wheezes. He has no rales.  Musculoskeletal: He exhibits no edema.  Lymphadenopathy:    He has no cervical adenopathy.  Neurological: He is alert and oriented to person, place, and time.  Vitals reviewed.      Recent Results (from the past 2160 hour(s))  POCT HgB A1C     Status: Abnormal   Collection Time: 08/29/16 12:46 PM  Result Value Ref Range   Hemoglobin A1C 7.1%      Assessment & Plan  Problem List Items Addressed This Visit      Cardiovascular and Mediastinum   Hypertension   Relevant Medications   lisinopril (PRINIVIL,ZESTRIL) 10 MG tablet     Endocrine   Diabetes mellitus (HCC)   Relevant Medications    lisinopril (PRINIVIL,ZESTRIL) 10 MG tablet     Other   Hyperlipemia   Relevant Medications   lisinopril (PRINIVIL,ZESTRIL) 10 MG tablet    Other Visit Diagnoses    Diabetes mellitus without complication (HCC)    -  Primary   Relevant Medications   lisinopril (PRINIVIL,ZESTRIL) 10 MG tablet   Other Relevant Orders   POCT HgB A1C (Completed)      Meds ordered this encounter  Medications  . lisinopril (PRINIVIL,ZESTRIL) 10 MG tablet    Sig: Take 1 tablet (10 mg total) by mouth daily.    Dispense:  30 tablet    Refill:  12   1. Diabetes mellitus without complication (HCC) Cont Glimeperide and Metformin - POCT HgB A1C-7.1  2. Essential hypertension  - lisinopril (PRINIVIL,ZESTRIL) 10 MG tablet; Take 1 tablet (10 mg total) by mouth daily.  Dispense: 30 tablet; Refill: 12  3. Diabetes mellitus due to underlying condition with hyperosmolarity without coma, without long-term current use of insulin (HCC)   4. Other hyperlipidemia cont Atorvastatin

## 2016-10-14 ENCOUNTER — Encounter: Payer: Self-pay | Admitting: Family Medicine

## 2016-10-14 ENCOUNTER — Ambulatory Visit (INDEPENDENT_AMBULATORY_CARE_PROVIDER_SITE_OTHER): Payer: Self-pay | Admitting: Family Medicine

## 2016-10-14 VITALS — BP 130/60 | HR 76 | Temp 97.6°F | Resp 16 | Ht 74.0 in | Wt 218.0 lb

## 2016-10-14 DIAGNOSIS — E0800 Diabetes mellitus due to underlying condition with hyperosmolarity without nonketotic hyperglycemic-hyperosmolar coma (NKHHC): Secondary | ICD-10-CM

## 2016-10-14 DIAGNOSIS — I10 Essential (primary) hypertension: Secondary | ICD-10-CM

## 2016-10-14 NOTE — Progress Notes (Signed)
Name: Corey Howard   MRN: [id number]    DOB: 1951/06/18   Date:10/14/2016       Progress Note  Subjective  Chief Complaint  Chief Complaint  Patient presents with  . Diabetes  . Hypertension    HPI Here for f/u of HBP and DM.  BSs at home run from 110-190 with most under 150.  BPs  At drug store run 130/70./  He is feeling well overall.  No problem-specific Assessment & Plan notes found for this encounter.   Past Medical History:  Diagnosis Date  . Diabetes mellitus without complication (HCC)   . Hyperlipidemia   . Hypertension     History reviewed. No pertinent surgical history.  Family History  Problem Relation Age of Onset  . Heart attack Mother     Social History   Social History  . Marital status: Single    Spouse name: N/A  . Number of children: N/A  . Years of education: N/A   Occupational History  . Not on file.   Social History Main Topics  . Smoking status: Current Every Day Smoker    Packs/day: 0.50    Types: Cigarettes  . Smokeless tobacco: Never Used  . Alcohol use No  . Drug use: No  . Sexual activity: Not on file   Other Topics Concern  . Not on file   Social History Narrative  . No narrative on file     Current Outpatient Prescriptions:  .  aspirin 81 MG tablet, Take 81 mg by mouth daily., Disp: , Rfl:  .  atorvastatin (LIPITOR) 20 MG tablet, TAKE ONE TABLET BY MOUTH ONCE DAILY AT BEDTIME, Disp: 30 tablet, Rfl: 12 .  glimepiride (AMARYL) 4 MG tablet, Take 2 tablets each AM., Disp: 180 tablet, Rfl: 3 .  hydrocortisone 1 % ointment, Apply 1 application topically 2 (two) times daily., Disp: 30 g, Rfl: 0 .  lisinopril (PRINIVIL,ZESTRIL) 10 MG tablet, Take 1 tablet (10 mg total) by mouth daily., Disp: 30 tablet, Rfl: 12 .  metFORMIN (GLUCOPHAGE) 500 MG tablet, TAKE TWO TABLETS BY MOUTH TWICE DAILY, Disp: 120 tablet, Rfl: 12  Allergies  Allergen Reactions  . Penicillin G Other (See Comments)     Review of Systems  Constitutional:  Negative for chills, fever, malaise/fatigue and weight loss.  HENT: Negative for hearing loss and tinnitus.   Eyes: Negative for blurred vision and double vision.  Respiratory: Negative for hemoptysis, sputum production and wheezing.   Cardiovascular: Negative for chest pain, palpitations and leg swelling.  Gastrointestinal: Negative for abdominal pain, blood in stool and heartburn.  Genitourinary: Negative for dysuria, frequency and urgency.       Nocturia x 2  Musculoskeletal: Negative for back pain, joint pain, myalgias and neck pain.  Skin: Negative for itching and rash.  Neurological: Negative for dizziness, tingling, tremors and headaches.      Objective  Vitals:   10/14/16 0947 10/14/16 1021  BP: (!) 143/69 130/60  Pulse: 76   Resp: 16   Temp: 97.6 F (36.4 C)   TempSrc: Oral   Weight: 218 lb (98.9 kg)   Height: 6\' 2"  (1.88 m)     Physical Exam  Constitutional: He is oriented to person, place, and time and well-developed, well-nourished, and in no distress. No distress.  HENT:  Head: Normocephalic and atraumatic.  Eyes: Conjunctivae and EOM are normal. Pupils are equal, round, and reactive to light. No scleral icterus.  Neck: Normal range of motion. Neck supple.  Carotid bruit is not present. No thyromegaly present.  Cardiovascular: Normal rate, regular rhythm and normal heart sounds.  Exam reveals no gallop and no friction rub.   No murmur heard. Pulmonary/Chest: Effort normal and breath sounds normal. No respiratory distress. He has no wheezes. He has no rales.  Abdominal: Soft. Bowel sounds are normal. He exhibits no distension and no mass. There is no tenderness.  Musculoskeletal: He exhibits no edema.  Lymphadenopathy:    He has no cervical adenopathy.  Neurological: He is alert and oriented to person, place, and time.  Vitals reviewed.      Recent Results (from the past 2160 hour(s))  POCT HgB A1C     Status: Abnormal   Collection Time: 08/29/16 12:46 PM   Result Value Ref Range   Hemoglobin A1C 7.1%      Assessment & Plan  Problem List Items Addressed This Visit      Cardiovascular and Mediastinum   Hypertension - Primary     Endocrine   Diabetes mellitus (HCC)      No orders of the defined types were placed in this encounter.  1. Essential hypertension Cont meds  2. Diabetes mellitus due to underlying condition with hyperosmolarity without coma, without long-term current use of insulin (HCC) Cont meds

## 2017-01-19 ENCOUNTER — Ambulatory Visit: Payer: Self-pay | Admitting: Family Medicine

## 2017-01-27 ENCOUNTER — Encounter: Payer: Self-pay | Admitting: Family Medicine

## 2017-01-27 ENCOUNTER — Ambulatory Visit (INDEPENDENT_AMBULATORY_CARE_PROVIDER_SITE_OTHER): Payer: Self-pay | Admitting: Family Medicine

## 2017-01-27 ENCOUNTER — Other Ambulatory Visit: Payer: Self-pay | Admitting: Family Medicine

## 2017-01-27 VITALS — BP 124/56 | HR 71 | Temp 98.4°F | Resp 16 | Ht 74.0 in | Wt 223.0 lb

## 2017-01-27 DIAGNOSIS — E785 Hyperlipidemia, unspecified: Secondary | ICD-10-CM

## 2017-01-27 DIAGNOSIS — E1169 Type 2 diabetes mellitus with other specified complication: Secondary | ICD-10-CM

## 2017-01-27 DIAGNOSIS — E119 Type 2 diabetes mellitus without complications: Secondary | ICD-10-CM

## 2017-01-27 DIAGNOSIS — I10 Essential (primary) hypertension: Secondary | ICD-10-CM

## 2017-01-27 DIAGNOSIS — Z0000 Encounter for general adult medical examination without abnormal findings: Secondary | ICD-10-CM

## 2017-01-27 DIAGNOSIS — Z720 Tobacco use: Secondary | ICD-10-CM

## 2017-01-27 DIAGNOSIS — Z125 Encounter for screening for malignant neoplasm of prostate: Secondary | ICD-10-CM

## 2017-01-27 LAB — POCT GLYCOSYLATED HEMOGLOBIN (HGB A1C): HEMOGLOBIN A1C: 6.8 — AB (ref ?–5.7)

## 2017-01-27 NOTE — Assessment & Plan Note (Addendum)
Well-controlled DM with A1c 6.8 at goal (improved from 7.6>7.1) No known complications or hypoglycemia.  Plan:  1. Reduce Glimepiride from 8mg  daily to 4mg  daily only x 1 tab AM wc - in future consider discontinue sulfonylurea altogether, since age 66 and dramatically improved A1c with lifestyle as well 2. Continue Metformin 1000mg  BID (500mg  tabs) 3. Encourage improved lifestyle - low carb, low sugar diet, reduce portion size, continue improving regular exercise 4. Continue check CBG as needed fasting AM 5. Continue ASA, ACEi, Statin 6. DM Foot exam done today. Advised to schedule DM ophtho exam Dr Larence PenningNice 05/2017, send record 7. Follow-up 3 months - Annual physical + labs with A1c

## 2017-01-27 NOTE — Assessment & Plan Note (Signed)
Moderately controlled lipids on statin and lifestyle Last lipid panel 09/2015 Elevated ASCVD due to smoking and DM, age, HTN  Plan: 1. Continue current meds - Atorvastatin 20mg  daily 2. Continue ASA 81mg  for primary ASCVD risk reduction 3. Encourage improved lifestyle - low carb/cholesterol, reduce portion size, continue improving regular exercise 4. Follow-up 3 months annual physical + labs

## 2017-01-27 NOTE — Assessment & Plan Note (Signed)
Active smoker Not ready to quit Counseling on smoking cessation and future plan for Low Dose Lung CA screening when ready, he wants to wait until medicare

## 2017-01-27 NOTE — Assessment & Plan Note (Signed)
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 Continue current BP regimen - Lisinopril 10mg  daily 2. Encourage improved lifestyle - low sodium diet, regular exercise 3. Continue monitor BP outside office, bring readings to next visit, if persistently >140/90 or new symptoms notify office sooner 4. Follow-up 3 months annual physical

## 2017-01-27 NOTE — Progress Notes (Signed)
Subjective:    Patient ID: Corey Howard, male    DOB: 1951/04/02, 66 y.o.   MRN: [id number]  Corey Howard is a 66 y.o. male presenting on 01/27/2017 for Diabetes and Hypertension   HPI   CHRONIC DM, Type 2: Reports no concerns today overall feels good, thinks sugar improved, eager to reduce meds in future. CBGs: Avg 100-110s, Low none < 100, High < 150. Checks CBGs fasting AM x 1 not everyday Meds: Metformin 1000mg  BID (uses 500mg  tabs), Glimepiride 8mg  daily in AM (x 2 of 4mg ) Reports good compliance. Tolerating well w/o side-effects Currently on ACEi Lifestyle: - Diet (Watching what he eats, limits desserts, significantly increased water intake, limiting low calorie or diet drinks)  - Exercise (Walking 1-2 miles daily) - Followed by Eye Doctor, Dr Larence Penning yearly, next visit 05/2017 Denies hypoglycemia, polyuria, visual changes, numbness or tingling.  CHRONIC HTN: Reports no significant history of persistent elevated BP, and no longer has BP cuff at home, rare reading >140/90 over past 2 years. Current Meds - Lisinopril 10mg  daily Reports good compliance, took meds today. Tolerating well, w/o complaints. Denies CP, dyspnea, HA, edema, dizziness / lightheadedness  HYPERLIPIDEMIA: - Reports no concerns. Last lipid panel 09/2015, elevated TG and low HDL - Currently taking Atorvastatin 20mg  daily, tolerating well without side effects or myalgias - Taking ASA 81mg  daily  Tobacco Abuse - If he is busy won't smoke as much, not ready to quit at this time, may return to discuss medications  Health Maintenance: - No known family history of prostate, request add PSA with upcoming labs   Social History  Substance Use Topics  . Smoking status: Current Every Day Smoker    Packs/day: 0.50    Years: 40.00    Types: Cigarettes  . Smokeless tobacco: Never Used  . Alcohol use Yes     Comment: occ    Review of Systems Per HPI unless specifically indicated above     Objective:      BP (!) 124/56   Pulse 71   Temp 98.4 F (36.9 C) (Oral)   Resp 16   Ht 6\' 2"  (1.88 m)   Wt 223 lb (101.2 kg)   BMI 28.63 kg/m   Wt Readings from Last 3 Encounters:  01/27/17 223 lb (101.2 kg)  10/14/16 218 lb (98.9 kg)  08/29/16 216 lb (98 kg)    Physical Exam  Constitutional: He is oriented to person, place, and time. He appears well-developed and well-nourished. No distress.  Well-appearing, comfortable, cooperative  HENT:  Head: Normocephalic and atraumatic.  Mouth/Throat: Oropharynx is clear and moist.  Eyes: Conjunctivae are normal. Right eye exhibits no discharge. Left eye exhibits no discharge.  Neck: Normal range of motion. Neck supple. No thyromegaly present.  Cardiovascular: Normal rate, regular rhythm, normal heart sounds and intact distal pulses.   No murmur heard. Pulmonary/Chest: Breath sounds normal. No respiratory distress. He has no wheezes. He has no rales.  Musculoskeletal: Normal range of motion. He exhibits no edema.  Lymphadenopathy:    He has no cervical adenopathy.  Neurological: He is alert and oriented to person, place, and time.  Skin: Skin is warm and dry. He is not diaphoretic.  Psychiatric: He has a normal mood and affect. His behavior is normal.  Nursing note and vitals reviewed.     Diabetic Foot Exam - Simple   Simple Foot Form Diabetic Foot exam was performed with the following findings:  Yes 01/27/2017  8:36 AM  Visual Inspection No deformities, no ulcerations, no other skin breakdown bilaterally:  Yes Sensation Testing Intact to touch and monofilament testing bilaterally:  Yes Pulse Check Posterior Tibialis and Dorsalis pulse intact bilaterally:  Yes Comments Generalized mild dry skin with some thickening toenails.     Results for orders placed or performed in visit on 01/27/17  POCT HgB A1C  Result Value Ref Range   Hemoglobin A1C 6.8 (A) 5.7    Recent Labs  05/27/16 0847 08/29/16 1246 01/27/17 0826  HGBA1C 7.6% 7.1%  6.8*       Assessment & Plan:   Problem List Items Addressed This Visit    Tobacco abuse    Active smoker Not ready to quit Counseling on smoking cessation and future plan for Low Dose Lung CA screening when ready, he wants to wait until medicare      Hyperlipidemia associated with type 2 diabetes mellitus (HCC)    Moderately controlled lipids on statin and lifestyle Last lipid panel 09/2015 Elevated ASCVD due to smoking and DM, age, HTN  Plan: 1. Continue current meds - Atorvastatin 20mg  daily 2. Continue ASA 81mg  for primary ASCVD risk reduction 3. Encourage improved lifestyle - low carb/cholesterol, reduce portion size, continue improving regular exercise 4. 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 Continue current BP regimen - Lisinopril 10mg  daily 2. Encourage improved lifestyle - low sodium diet, regular exercise 3. Continue monitor BP outside office, bring readings to next visit, if persistently >140/90 or new symptoms notify office sooner 4. Follow-up 3 months annual physical      Controlled type 2 diabetes mellitus without complication (HCC) - Primary    Well-controlled DM with A1c 6.8 at goal (improved from 7.6>7.1) No known complications or hypoglycemia.  Plan:  1. Reduce Glimepiride from 8mg  daily to 4mg  daily only x 1 tab AM wc - in future consider discontinue sulfonylurea altogether, since age 66 and dramatically improved A1c with lifestyle as well 2. Continue Metformin 1000mg  BID (500mg  tabs) 3. Encourage improved lifestyle - low carb, low sugar diet, reduce portion size, continue improving regular exercise 4. Continue check CBG as needed fasting AM 5. Continue ASA, ACEi, Statin 6. DM Foot exam done  today. Advised to schedule DM ophtho exam Dr Larence PenningNice 05/2017, send record 7. Follow-up 3 months - Annual physical + labs with A1c      Relevant Medications   glimepiride (AMARYL) 4 MG tablet   Other Relevant Orders   POCT HgB A1C (Completed)      Meds ordered this encounter  Medications  . glimepiride (AMARYL) 4 MG tablet    Sig: Take 1 tablet (4 mg total) by mouth daily with breakfast. Take 2 tablets each AM.    Dispense:  180 tablet    Refill:  3    Follow up plan: Return in about 3 months (around 04/29/2017) for Annual Physical.  Saralyn PilarAlexander Karamalegos, DO [facility] 01/27/2017, 11:44 PM

## 2017-01-27 NOTE — Patient Instructions (Addendum)
Thank you for coming to the clinic today.  1.  Great job, A1c 6.8, goal is < 7.0. You have been at goal for 6 months now.  Reduce Glimepiride by 1 tablet - so take 1 in morning daily for next 3 months to see how you do  I recommend keeping the metformin longer, as this does more for your health overall. Continue current Metformin dose 1000mg  twice daily with food.  Think about Low Dose CT Lung Cancer Screening scan at age 64 or older. Try to quit smoking as soon as possible, let me know if need extra help.  1 [id number]  You will be due for FASTING BLOOD WORK (no food or drink after midnight before, only water or coffee without cream/sugar on the morning of)  - Please go ahead and schedule a "Lab Only" visit in the morning at the clinic for lab draw in 3 months - before next Annual Physical - Make sure Lab Only appointment is at least 1-2 weeks before your next appointment, so that results will be available  For Lab Results, once available within 2-3 days of blood draw, you can can log in to MyChart online to view your results and a brief explanation. Also, we can discuss results at next follow-up visit.  Please schedule a Follow-up Appointment to: Return in about 3 months (around 04/29/2017) for Annual Physical.  If you have any other questions or concerns, please feel free to call the clinic or send a message through MyChart. You may also schedule an earlier appointment if necessary.  Additionally, you may be receiving a survey about your experience at our clinic within a few days to 1 week by e-mail or mail. We value your feedback.  Saralyn PilarAlexander Cotina Freedman, DO [facility]

## 2017-04-21 ENCOUNTER — Other Ambulatory Visit: Payer: Self-pay

## 2017-04-30 ENCOUNTER — Encounter: Payer: Self-pay | Admitting: Family Medicine

## 2017-05-07 ENCOUNTER — Other Ambulatory Visit: Payer: Self-pay

## 2017-05-07 DIAGNOSIS — E119 Type 2 diabetes mellitus without complications: Secondary | ICD-10-CM

## 2017-05-07 MED ORDER — GLIMEPIRIDE 4 MG PO TABS: 4.0000 mg | ORAL_TABLET | Freq: Every day | ORAL | 0 refills | Status: DC

## 2017-09-08 ENCOUNTER — Other Ambulatory Visit: Payer: Self-pay

## 2017-09-08 DIAGNOSIS — E785 Hyperlipidemia, unspecified: Principal | ICD-10-CM

## 2017-09-08 DIAGNOSIS — E119 Type 2 diabetes mellitus without complications: Secondary | ICD-10-CM

## 2017-09-08 DIAGNOSIS — I10 Essential (primary) hypertension: Secondary | ICD-10-CM

## 2017-09-08 DIAGNOSIS — E1169 Type 2 diabetes mellitus with other specified complication: Secondary | ICD-10-CM

## 2017-09-08 MED ORDER — METFORMIN HCL 500 MG PO TABS: 1000.0000 mg | ORAL_TABLET | Freq: Two times a day (BID) | ORAL | 0 refills | Status: DC

## 2017-09-08 MED ORDER — ATORVASTATIN CALCIUM 20 MG PO TABS: 20.0000 mg | ORAL_TABLET | Freq: Every day | ORAL | 0 refills | Status: DC

## 2017-09-08 MED ORDER — LISINOPRIL 10 MG PO TABS: 10.0000 mg | ORAL_TABLET | Freq: Every day | ORAL | 0 refills | Status: DC

## 2018-02-17 LAB — HM DIABETES EYE EXAM

## 2018-03-09 ENCOUNTER — Emergency Department
Admission: EM | Admit: 2018-03-09 | Discharge: 2018-03-09 | Disposition: A | Discharge status: Home / Self Care | Payer: Self-pay | Attending: Emergency Medicine | Admitting: Emergency Medicine

## 2018-03-09 ENCOUNTER — Other Ambulatory Visit: Payer: Self-pay

## 2018-03-09 ENCOUNTER — Encounter: Payer: Self-pay | Admitting: Emergency Medicine

## 2018-03-09 ENCOUNTER — Emergency Department: Payer: Self-pay

## 2018-03-09 DIAGNOSIS — R079 Chest pain, unspecified: Secondary | ICD-10-CM | POA: Insufficient documentation

## 2018-03-09 DIAGNOSIS — I10 Essential (primary) hypertension: Secondary | ICD-10-CM | POA: Insufficient documentation

## 2018-03-09 DIAGNOSIS — E119 Type 2 diabetes mellitus without complications: Secondary | ICD-10-CM | POA: Insufficient documentation

## 2018-03-09 DIAGNOSIS — F17210 Nicotine dependence, cigarettes, uncomplicated: Secondary | ICD-10-CM | POA: Insufficient documentation

## 2018-03-09 LAB — BASIC METABOLIC PANEL
Anion gap: 10 (ref 5–15)
BUN: 19 mg/dL (ref 8–23)
CALCIUM: 9.1 mg/dL (ref 8.9–10.3)
CO2: 28 mmol/L (ref 22–32)
CREATININE: 1.36 mg/dL — AB (ref 0.61–1.24)
Chloride: 97 mmol/L — ABNORMAL LOW (ref 98–111)
GFR calc Af Amer: >60 mL/min (ref >60)
GFR calc non Af Amer: 53 mL/min — ABNORMAL LOW (ref >60)
Glucose, Bld: 256 mg/dL — ABNORMAL HIGH (ref 70–99)
Potassium: 4.1 mmol/L (ref 3.5–5.1)
Sodium: 135 mmol/L (ref 135–145)

## 2018-03-09 LAB — CBC
HCT: 46.5 % (ref 40.0–52.0)
Hemoglobin: 15.9 g/dL (ref 13.0–18.0)
MCH: 29.6 pg (ref 26.0–34.0)
MCHC: 34.2 g/dL (ref 32.0–36.0)
MCV: 86.7 fL (ref 80.0–100.0)
PLATELETS: 248 10*3/uL (ref 150–440)
RBC: 5.36 MIL/uL (ref 4.40–5.90)
RDW: 14 % (ref 11.5–14.5)
WBC: 14.8 10*3/uL — ABNORMAL HIGH (ref 3.8–10.6)

## 2018-03-09 LAB — TROPONIN I: Troponin I: <0.03 ng/mL (ref <0.03)

## 2018-03-09 NOTE — ED Notes (Signed)
Patient transported to XR. 

## 2018-03-09 NOTE — ED Provider Notes (Signed)
[facility] Emergency Department Provider Note       Time seen: ----------------------------------------- 3:30 PM on 03/09/2018 -----------------------------------------   I have reviewed the triage vital signs and the nursing notes.  HISTORY   Chief Complaint Chest Pain    HPI Corey Howard is a 67 y.o. male with a history of diabetes, hypertension and hyperlipidemia who presents to the ED for heavy feeling in his chest.  Patient states it was sharp and radiated across his chest.  He has not had a history of this before.  Patient did take nitroglycerin that were his neighbors which seem to help.  Pain seems to have resolved at this time.  He denies any recent illness or other complaints, nothing made it worse.  Past Medical History:  Diagnosis Date  . Diabetes mellitus without complication [facility](HCC)     Patient Active Problem List   Diagnosis Date Noted  . Tobacco abuse 01/27/2017  . Seborrhea 10/01/2015  . Controlled type 2 diabetes mellitus without complication (HCC) 03/16/2015  . Essential hypertension 03/16/2015  . Hyperlipidemia associated with type 2 diabetes mellitus (HCC) 03/16/2015    Past Surgical History:  Procedure Laterality Date  . TONSILLECTOMY      Allergies Penicillin g  Social History Social History   Tobacco Use  . Smoking status: Current Every Day Smoker    Packs/day: 0.50    Years: 40.00    Pack years: 20.00    Types: Cigarettes  . Smokeless tobacco: Never Used  Substance Use Topics  . Alcohol use: Yes    Comment: occ  . Drug use: No   Review of Systems Constitutional: Negative for fever. Cardiovascular: Positive for chest pain Respiratory: Negative for shortness of breath. Gastrointestinal: Negative for abdominal pain, vomiting and diarrhea. Musculoskeletal: Negative for back pain. Skin: Negative for rash. Neurological: Negative for headaches, focal weakness or numbness.  All systems  negative/normal/unremarkable except as stated in the HPI  ____________________________________________   PHYSICAL EXAM:  VITAL SIGNS: ED Triage Vitals  Enc Vitals Group     BP 03/09/18 1104 (!) 176/78     Pulse Rate 03/09/18 1104 87     Resp 03/09/18 1104 18     Temp 03/09/18 1104 99.5 F (37.5 C)     Temp Source 03/09/18 1104 Oral     SpO2 03/09/18 1104 95 %     Weight 03/09/18 1105 218 lb (98.9 kg)     Height 03/09/18 1105 6\' 2"  (1.88 m)     Head Circumference --      Peak Flow --      Pain Score 03/09/18 1105 8     Pain Loc --      Pain Edu? --      Excl. in GC? --    Constitutional: Alert and oriented. Well appearing and in no distress. Eyes: Conjunctivae are normal. Normal extraocular movements. ENT   Head: Normocephalic and atraumatic.   Nose: No congestion/rhinnorhea.   Mouth/Throat: Mucous membranes are moist.   Neck: No stridor. Cardiovascular: Normal rate, regular rhythm. No murmurs, rubs, or gallops. Respiratory: Normal respiratory effort without tachypnea nor retractions. Breath sounds are clear and equal bilaterally. No wheezes/rales/rhonchi. Gastrointestinal: Soft and nontender. Normal bowel sounds Musculoskeletal: Nontender with normal range of motion in extremities. No lower extremity tenderness nor edema. Neurologic:  Normal speech and language. No gross focal neurologic deficits are appreciated.  Skin:  Skin is warm, dry and intact. No rash noted. Psychiatric: Mood and affect are normal. Speech  and behavior are normal.  ____________________________________________  EKG: Interpreted by me.  Sinus rhythm with first-degree AV block, rate is 87 bpm, left axis deviation, normal QT  Repeat EKG interpreted by me, sinus rhythm rate 79 bpm, prolonged PR interval, left anterior fascicular block, possible old anterior infarct  ____________________________________________  ED COURSE:  As part of my medical decision making, I reviewed the following  data within the electronic MEDICAL RECORD NUMBER History obtained from family if available, nursing notes, old chart and ekg, as well as notes from prior ED visits. Patient presented for chest pain, we will assess with labs and imaging as indicated at this time.   Procedures ____________________________________________   LABS (pertinent positives/negatives)  Labs Reviewed  BASIC METABOLIC PANEL - Abnormal; Notable for the following components:      Result Value   Chloride 97 (*)    Glucose, Bld 256 (*)    Creatinine, Ser 1.36 (*)    GFR calc non Af Amer 53 (*)    All other components within normal limits  CBC - Abnormal; Notable for the following components:   WBC 14.8 (*)    All other components within normal limits  TROPONIN I  TROPONIN I    RADIOLOGY  Chest x-ray is unremarkable for any acute process  ____________________________________________  DIFFERENTIAL DIAGNOSIS   Musculoskeletal pain, unstable angina, MI, PE, GERD, anxiety  FINAL ASSESSMENT AND PLAN  Chest pain   Plan: The patient had presented for nonspecific chest pain. Patient's labs were negative including repeat troponin although his glucose was mildly elevated. Patient's imaging did not reveal any acute process.  I have discussed with cardiology who will see him in less than 24 hours at 915 tomorrow.  Patient is agreeable to plan, currently chest pain-free.   Ulice Dash, MD   Note: This note was generated in part or whole with voice recognition software. Voice recognition is usually quite accurate but there are transcription errors that can and very often do occur. I apologize for any typographical errors that were not detected and corrected.     Emily Filbert, MD 03/09/18 [phone]

## 2018-03-09 NOTE — ED Triage Notes (Signed)
Pt c/o heavy feeling in chest and feeling like someone is pressing on his shoulders since 0730 this morning when he woke up. Thought it would go away and it didn't. Took 2 nitro pills that were his neighbors. States this helped the pain a little bit but pain is back to where it was.  No hx MI, borderline diabetic.

## 2018-03-30 ENCOUNTER — Other Ambulatory Visit: Payer: Self-pay | Admitting: Family Medicine

## 2018-03-30 DIAGNOSIS — E119 Type 2 diabetes mellitus without complications: Secondary | ICD-10-CM

## 2018-03-30 DIAGNOSIS — I10 Essential (primary) hypertension: Secondary | ICD-10-CM

## 2018-03-30 DIAGNOSIS — E785 Hyperlipidemia, unspecified: Secondary | ICD-10-CM

## 2018-03-30 DIAGNOSIS — E1169 Type 2 diabetes mellitus with other specified complication: Secondary | ICD-10-CM

## 2018-03-30 LAB — HM DIABETES EYE EXAM

## 2018-04-15 ENCOUNTER — Encounter: Payer: Self-pay | Admitting: Family Medicine

## 2018-04-16 ENCOUNTER — Encounter: Payer: Self-pay | Admitting: Family Medicine

## 2018-08-26 DIAGNOSIS — N183 Chronic kidney disease, stage 3 unspecified: Secondary | ICD-10-CM | POA: Insufficient documentation

## 2018-08-26 DIAGNOSIS — N1832 Chronic kidney disease, stage 3b: Secondary | ICD-10-CM | POA: Insufficient documentation

## 2018-09-24 LAB — HM HEPATITIS C SCREENING LAB: HM Hepatitis Screen: NEGATIVE

## 2018-09-28 ENCOUNTER — Other Ambulatory Visit: Payer: Self-pay | Admitting: Family Medicine

## 2018-09-28 DIAGNOSIS — Z136 Encounter for screening for cardiovascular disorders: Secondary | ICD-10-CM

## 2018-09-28 DIAGNOSIS — Z720 Tobacco use: Secondary | ICD-10-CM

## 2018-10-04 ENCOUNTER — Other Ambulatory Visit (INDEPENDENT_AMBULATORY_CARE_PROVIDER_SITE_OTHER): Payer: Self-pay | Admitting: Family Medicine

## 2018-10-04 DIAGNOSIS — Z720 Tobacco use: Secondary | ICD-10-CM

## 2018-10-04 DIAGNOSIS — Z136 Encounter for screening for cardiovascular disorders: Secondary | ICD-10-CM

## 2018-10-06 ENCOUNTER — Ambulatory Visit (INDEPENDENT_AMBULATORY_CARE_PROVIDER_SITE_OTHER): Payer: Medicare HMO

## 2018-10-06 DIAGNOSIS — Z136 Encounter for screening for cardiovascular disorders: Secondary | ICD-10-CM | POA: Diagnosis not present

## 2018-10-06 DIAGNOSIS — Z720 Tobacco use: Secondary | ICD-10-CM

## 2018-10-18 ENCOUNTER — Ambulatory Visit: Payer: Medicare HMO | Attending: Family Medicine

## 2018-12-23 ENCOUNTER — Other Ambulatory Visit: Payer: Self-pay | Admitting: Nephrology

## 2018-12-23 DIAGNOSIS — N183 Chronic kidney disease, stage 3 unspecified: Secondary | ICD-10-CM

## 2019-01-19 ENCOUNTER — Other Ambulatory Visit: Payer: Self-pay

## 2019-01-19 ENCOUNTER — Ambulatory Visit
Admission: RE | Admit: 2019-01-19 | Discharge: 2019-01-19 | Disposition: A | Discharge status: Home / Self Care | Payer: Medicare HMO | Source: Ambulatory Visit | Attending: Nephrology | Admitting: Nephrology

## 2019-01-19 DIAGNOSIS — N183 Chronic kidney disease, stage 3 unspecified: Secondary | ICD-10-CM

## 2019-06-19 IMAGING — CR DG CHEST 2V
1 series · 2 of 2 positions shown · non-contrast
Comparison: 03/31/2011, 03/27/2011

CLINICAL DATA: 66-year-old male with a history of chest pain

EXAM:
CHEST - 2 VIEW

[Series 1: dg chest 2 view · 0.14mm/px · 2 of 2 slices shown]
[im 1/2]
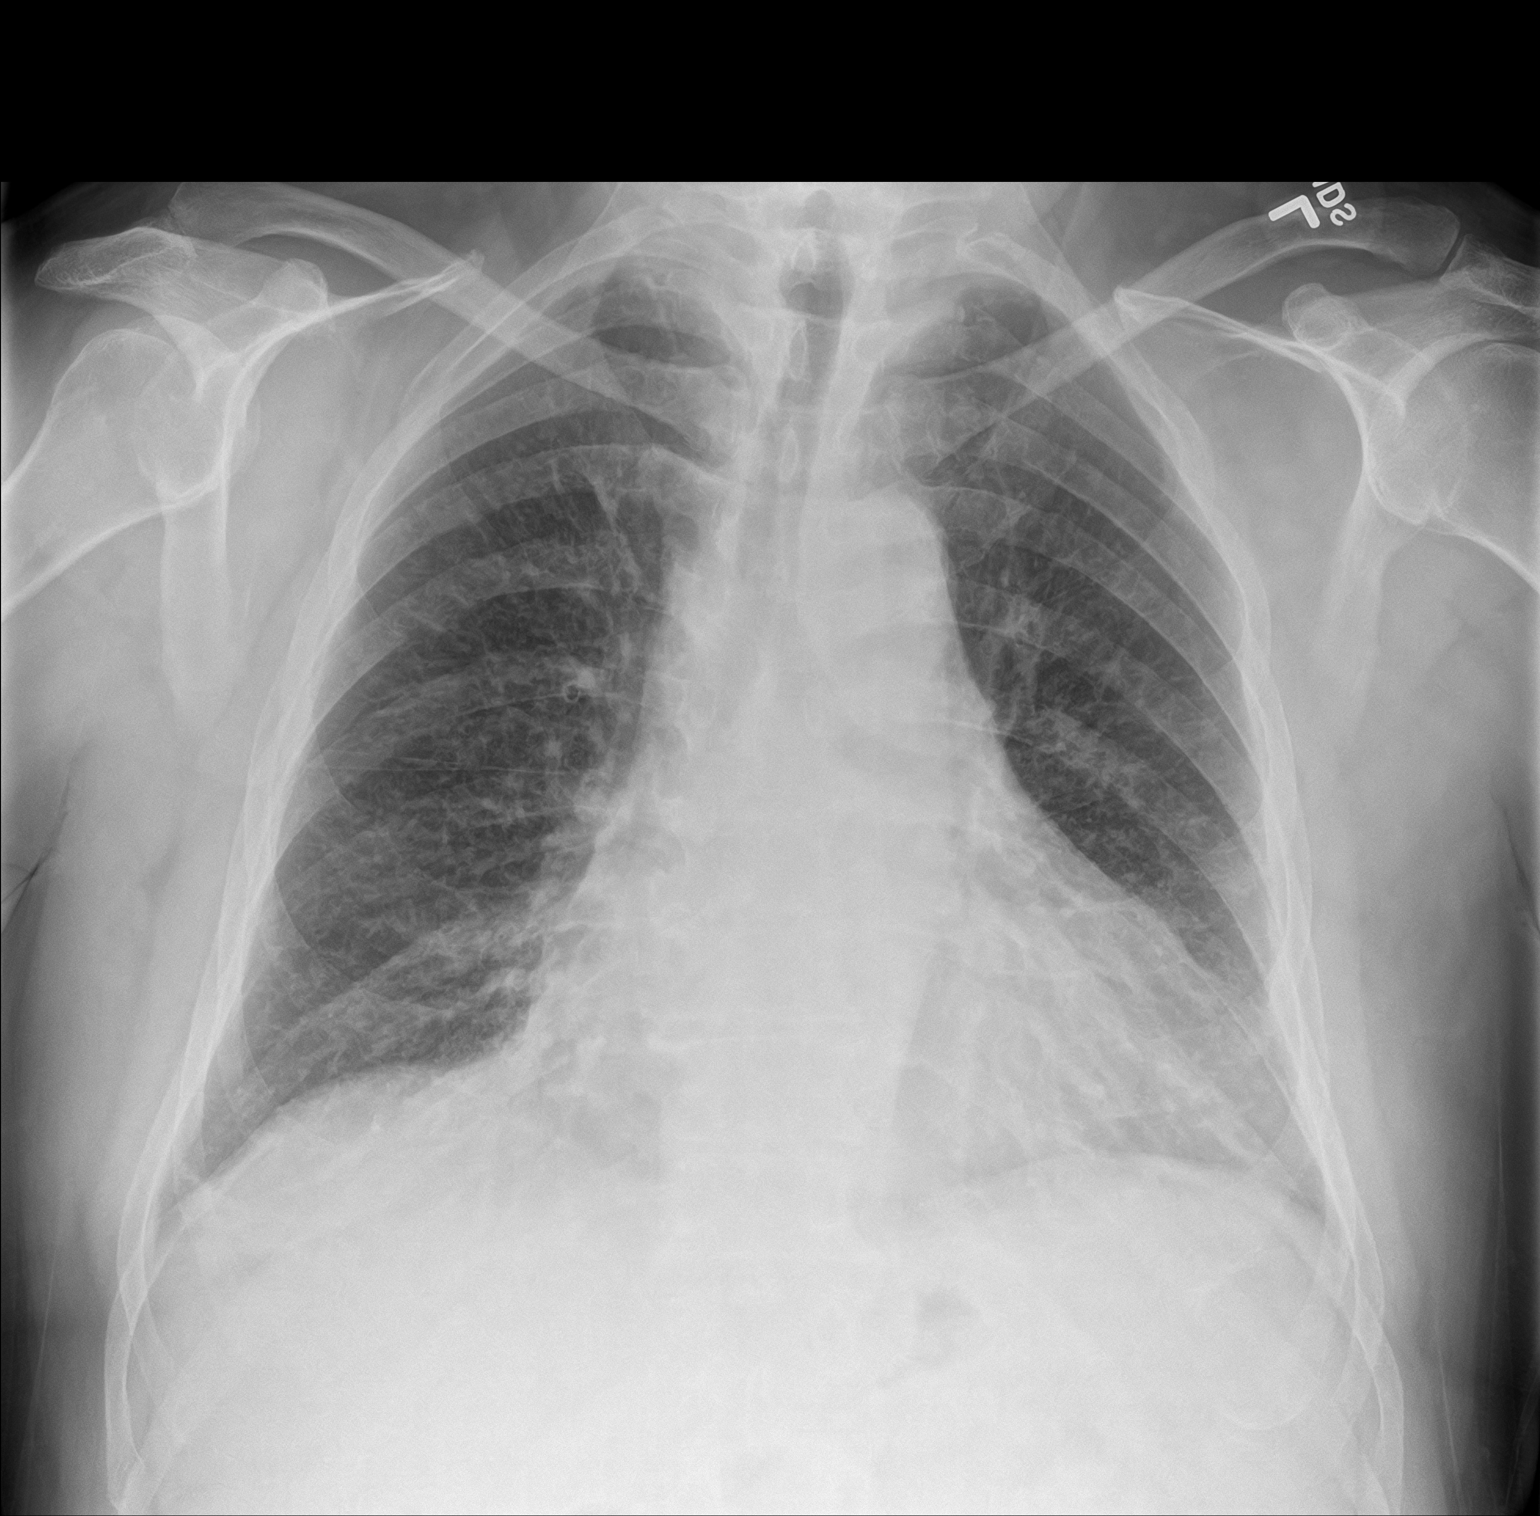
[im 2/2]
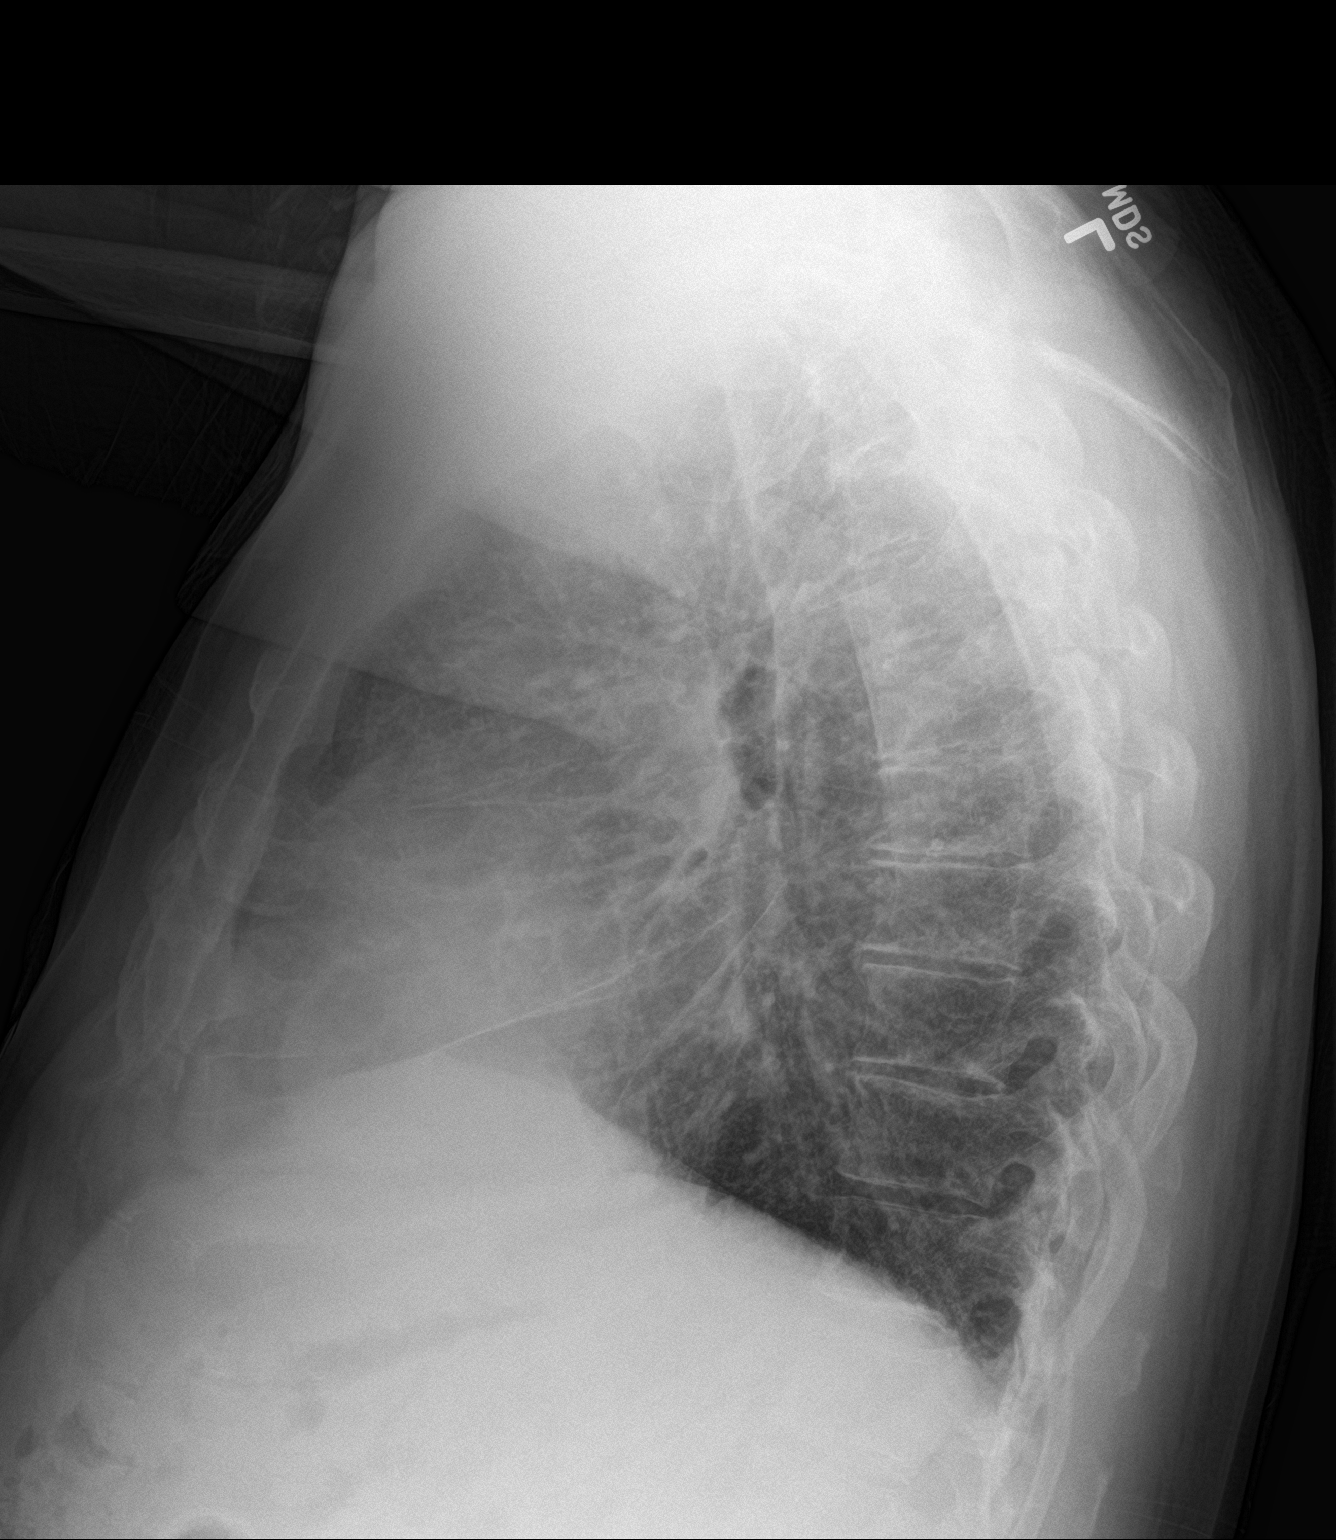

[2 of 2 positions shown; findings below may reference images not displayed]

FINDINGS: Cardiomediastinal silhouette unchanged in size and contour. No
evidence of central vascular congestion. No pneumothorax.

Coarsened interstitial markings, similar to the prior. No confluent
airspace disease. Airspace opacity of the right lung base resolved
compared to the prior. No pleural effusion.

No acute displaced fracture.
IMPRESSION: Chronic lung changes without evidence of acute cardiopulmonary
disease.

Improved aeration from the prior, with resolution of the prior
airspace opacity.

## 2020-04-30 IMAGING — US US RENAL
1 series · 14 of 25 positions shown · non-contrast
Comparison: None

CLINICAL DATA: Stage III chronic kidney disease, hypertension, type
II diabetes mellitus

EXAM:
RENAL / URINARY TRACT ULTRASOUND COMPLETE

[Series 1: us renal · 0.26mm/px · 14 of 33 slices shown]
[im 1/33]
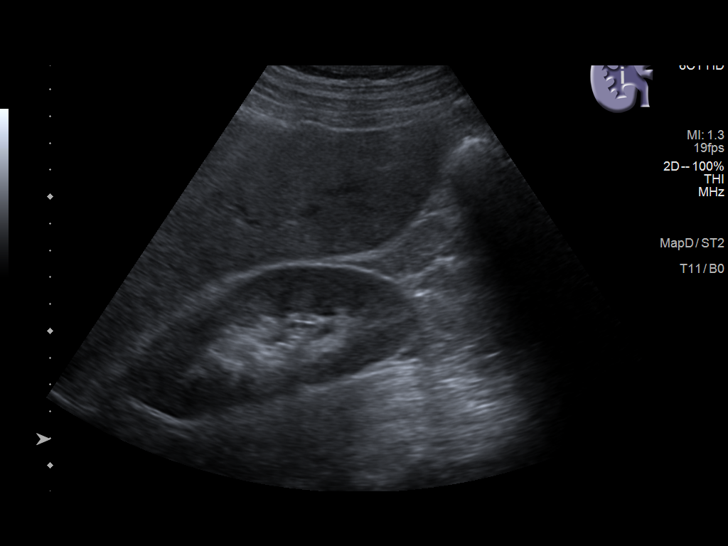
[im 3/33]
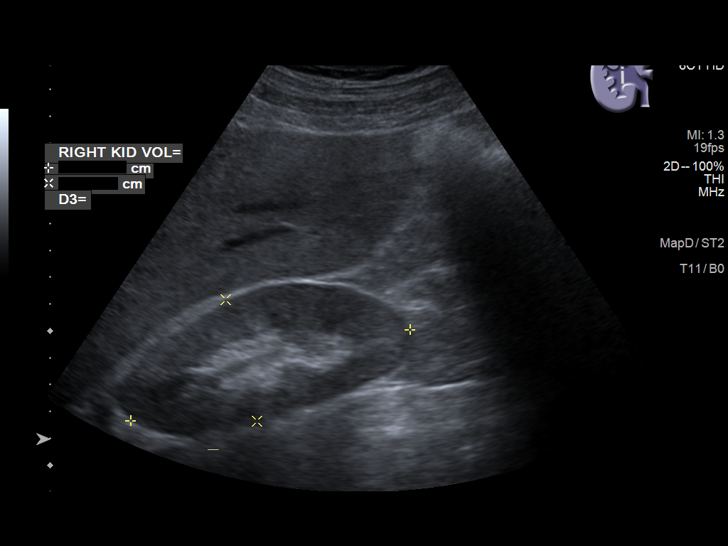
[im 6/33]
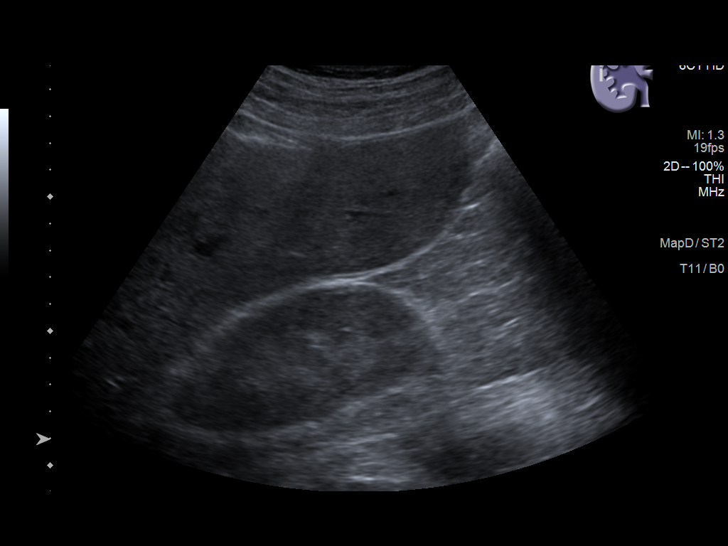
[im 9/33]
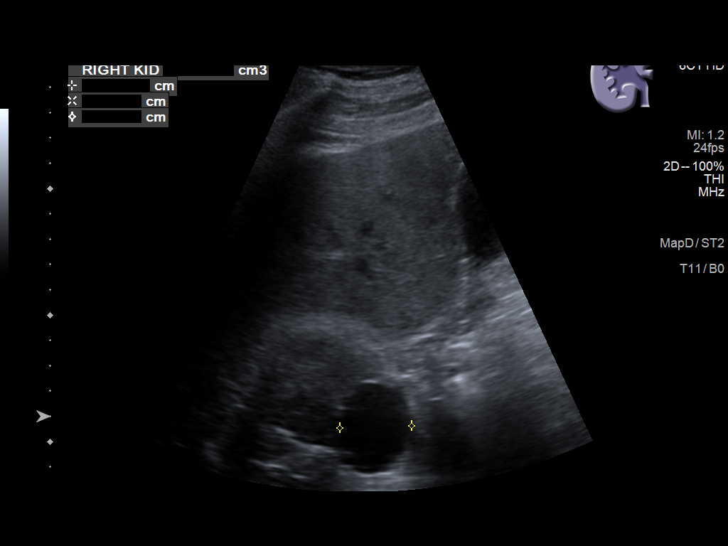
[im 11/33]
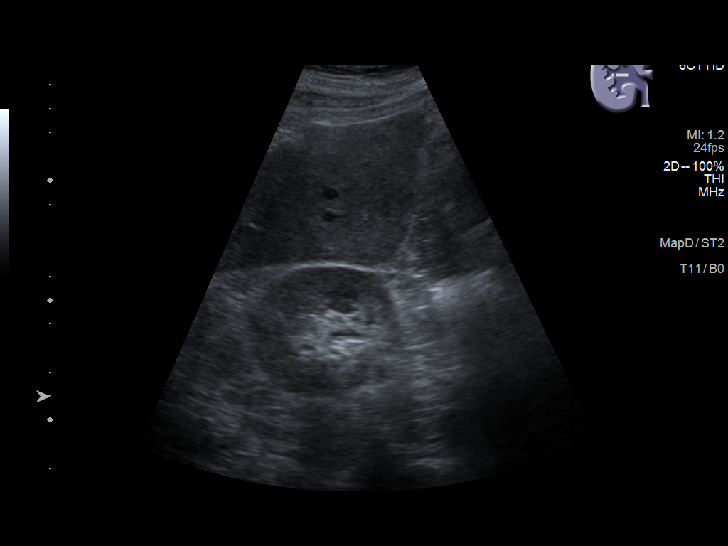
[im 13/33]
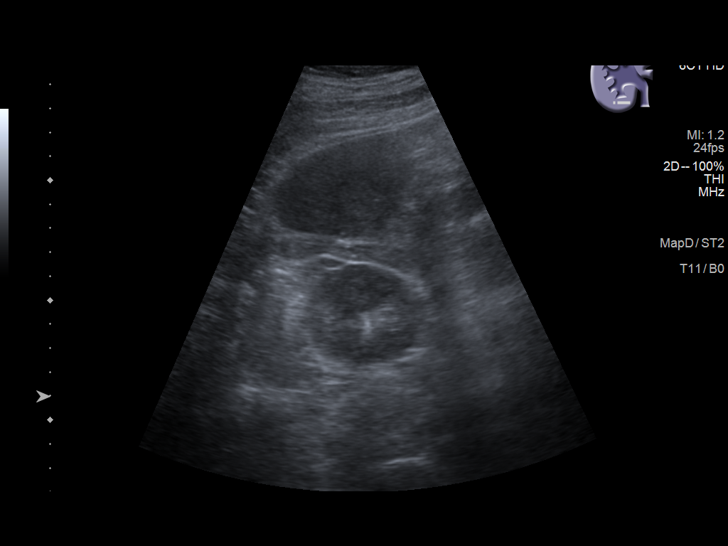
[im 15/33]
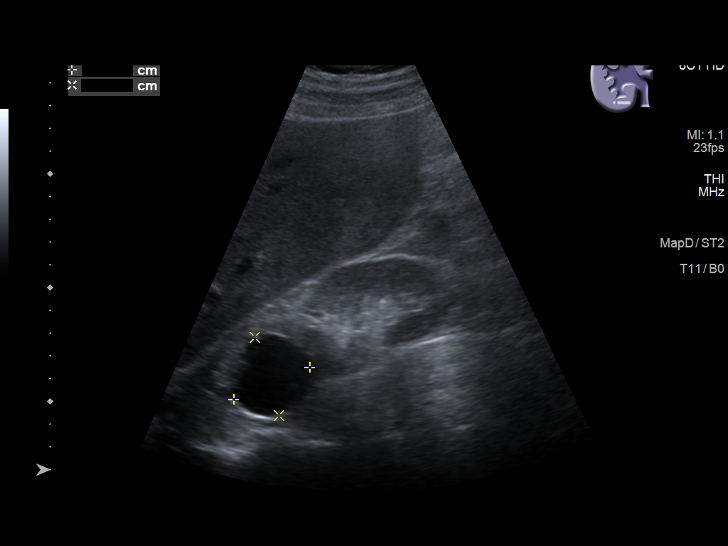
[im 18/33]
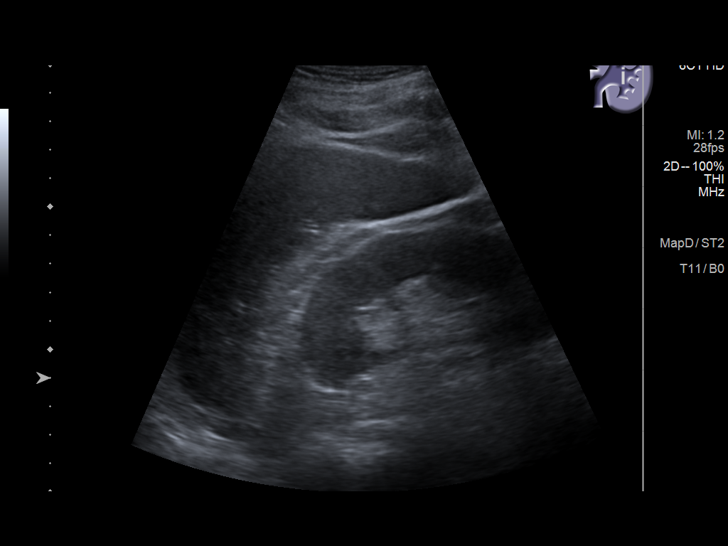
[im 21/33]
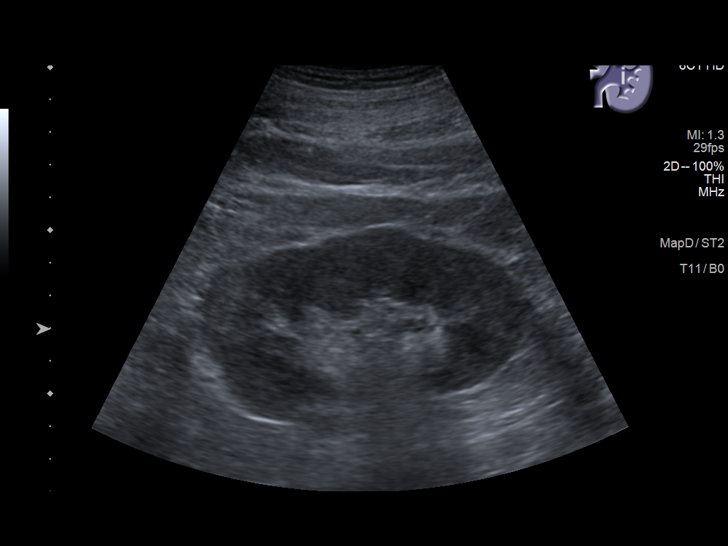
[im 22/33]
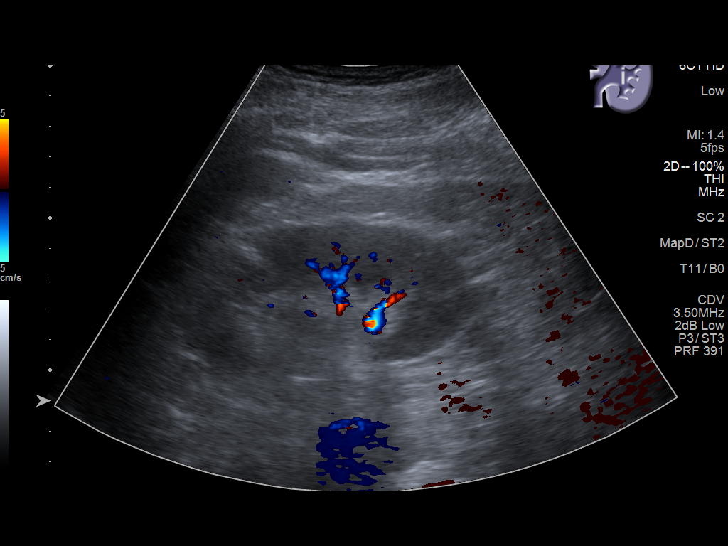
[im 25/33]
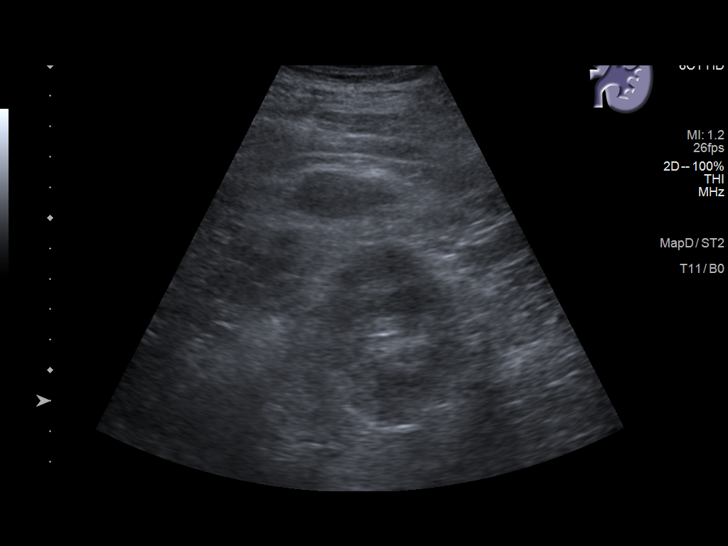
[im 27/33]
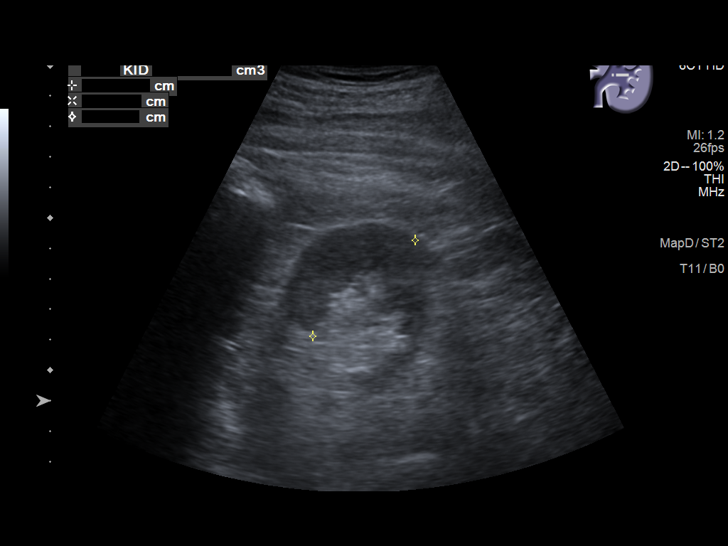
[im 30/33]
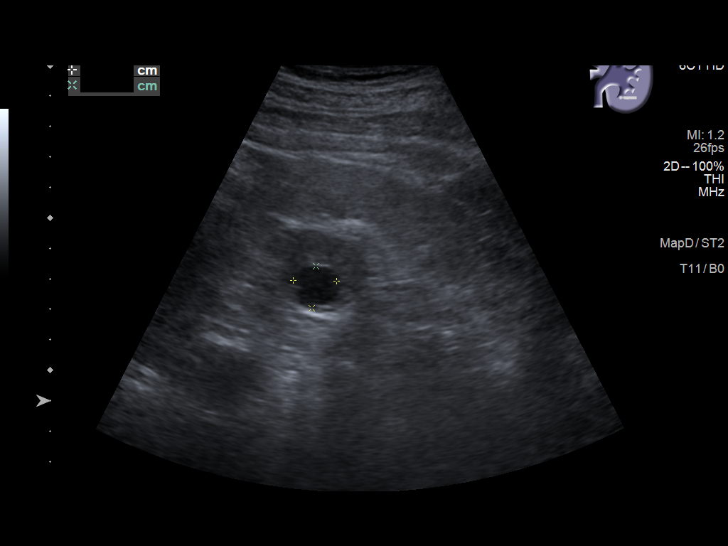
[im 33/33]
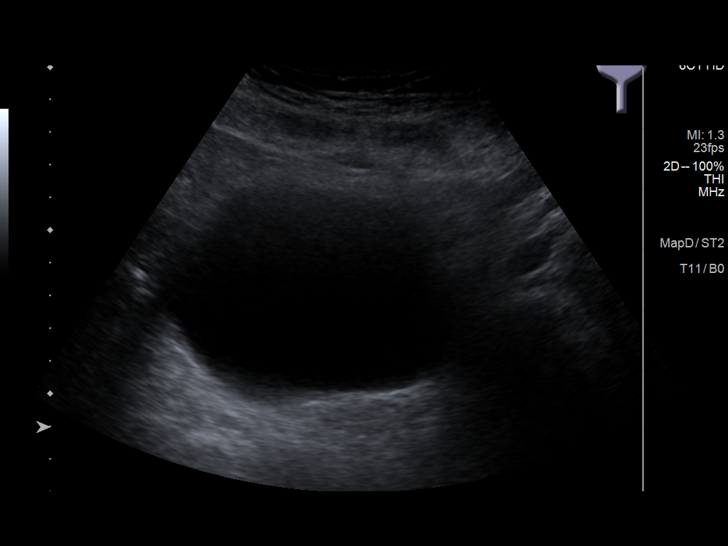

[14 of 25 positions shown; findings below may reference images not displayed]

FINDINGS: Right Kidney:

Renal measurements: 11.0 x 4.7 x 5.5 cm = volume: 147 mL. Normal
cortical thickness in echogenicity. No hydronephrosis or shadowing
calcification. Simple appearing cyst identified upper pole 3.6 x
x 2.8 cm.

Left Kidney:

Renal measurements: 10.1 x 5.2 x 4.6 cm = volume: 127 mL. Normal
cortical thickness and echogenicity. No hydronephrosis or shadowing
calcification. Small simple appearing cyst identified at inferior
pole, 1.7 x 1.4 x 1.4 cm.

Bladder:

Appears normal for degree of bladder distention.
IMPRESSION: Small BILATERAL renal cysts.

Otherwise normal renal ultrasound.

## 2021-04-23 ENCOUNTER — Other Ambulatory Visit: Payer: Self-pay

## 2021-04-23 ENCOUNTER — Emergency Department
Admission: EM | Admit: 2021-04-23 | Discharge: 2021-04-23 | Disposition: A | Discharge status: Home / Self Care | Payer: Medicare (Managed Care) | Attending: Emergency Medicine | Admitting: Emergency Medicine

## 2021-04-23 ENCOUNTER — Ambulatory Visit
Admission: RE | Admit: 2021-04-23 | Discharge: 2021-04-23 | Disposition: A | Discharge status: Home / Self Care | Payer: Medicare (Managed Care) | Source: Ambulatory Visit | Attending: Family Medicine | Admitting: Family Medicine

## 2021-04-23 ENCOUNTER — Other Ambulatory Visit: Payer: Self-pay | Admitting: Family Medicine

## 2021-04-23 DIAGNOSIS — R600 Localized edema: Secondary | ICD-10-CM | POA: Insufficient documentation

## 2021-04-23 DIAGNOSIS — I10 Essential (primary) hypertension: Secondary | ICD-10-CM | POA: Diagnosis not present

## 2021-04-23 DIAGNOSIS — Z7984 Long term (current) use of oral hypoglycemic drugs: Secondary | ICD-10-CM | POA: Insufficient documentation

## 2021-04-23 DIAGNOSIS — E1169 Type 2 diabetes mellitus with other specified complication: Secondary | ICD-10-CM | POA: Insufficient documentation

## 2021-04-23 DIAGNOSIS — I82432 Acute embolism and thrombosis of left popliteal vein: Secondary | ICD-10-CM | POA: Diagnosis not present

## 2021-04-23 DIAGNOSIS — F17210 Nicotine dependence, cigarettes, uncomplicated: Secondary | ICD-10-CM | POA: Insufficient documentation

## 2021-04-23 DIAGNOSIS — M7989 Other specified soft tissue disorders: Secondary | ICD-10-CM

## 2021-04-23 DIAGNOSIS — Z7901 Long term (current) use of anticoagulants: Secondary | ICD-10-CM | POA: Insufficient documentation

## 2021-04-23 DIAGNOSIS — Z79899 Other long term (current) drug therapy: Secondary | ICD-10-CM | POA: Diagnosis not present

## 2021-04-23 DIAGNOSIS — R2242 Localized swelling, mass and lump, left lower limb: Secondary | ICD-10-CM | POA: Diagnosis present

## 2021-04-23 LAB — BASIC METABOLIC PANEL
Anion gap: 10 (ref 5–15)
BUN: 29 mg/dL — ABNORMAL HIGH (ref 8–23)
CO2: 27 mmol/L (ref 22–32)
Calcium: 9 mg/dL (ref 8.9–10.3)
Chloride: 101 mmol/L (ref 98–111)
Creatinine, Ser: 1.62 mg/dL — ABNORMAL HIGH (ref 0.61–1.24)
GFR, Estimated: 46 mL/min — ABNORMAL LOW (ref >60)
Glucose, Bld: 107 mg/dL — ABNORMAL HIGH (ref 70–99)
Potassium: 4.1 mmol/L (ref 3.5–5.1)
Sodium: 138 mmol/L (ref 135–145)

## 2021-04-23 LAB — CBC WITH DIFFERENTIAL/PLATELET
Abs Immature Granulocytes: 0.04 10*3/uL (ref 0.00–0.07)
Basophils Absolute: 0 10*3/uL (ref 0.0–0.1)
Basophils Relative: 0 %
Eosinophils Absolute: 0.3 10*3/uL (ref 0.0–0.5)
Eosinophils Relative: 3 %
HCT: 42.3 % (ref 39.0–52.0)
Hemoglobin: 13.9 g/dL (ref 13.0–17.0)
Immature Granulocytes: 0 %
Lymphocytes Relative: 15 %
Lymphs Abs: 1.4 10*3/uL (ref 0.7–4.0)
MCH: 27.9 pg (ref 26.0–34.0)
MCHC: 32.9 g/dL (ref 30.0–36.0)
MCV: 84.9 fL (ref 80.0–100.0)
Monocytes Absolute: 0.9 10*3/uL (ref 0.1–1.0)
Monocytes Relative: 9 %
Neutro Abs: 6.6 10*3/uL (ref 1.7–7.7)
Neutrophils Relative %: 73 %
Platelets: 352 10*3/uL (ref 150–400)
RBC: 4.98 MIL/uL (ref 4.22–5.81)
RDW: 15.6 % — ABNORMAL HIGH (ref 11.5–15.5)
WBC: 9.2 10*3/uL (ref 4.0–10.5)
nRBC: 0 % (ref 0.0–0.2)

## 2021-04-23 LAB — PROTIME-INR
INR: 1.1 (ref 0.8–1.2)
Prothrombin Time: 14.1 seconds (ref 11.4–15.2)

## 2021-04-23 MED ORDER — APIXABAN 5 MG PO TABS: ORAL_TABLET | ORAL | 2 refills | Status: DC

## 2021-04-23 MED ORDER — APIXABAN 5 MG PO TABS
10.0000 mg | ORAL_TABLET | Freq: Two times a day (BID) | ORAL | Status: DC
  Administered 2021-04-23: 10 mg via ORAL
  Filled 2021-04-23: qty 2

## 2021-04-23 NOTE — ED Notes (Signed)
See triage note  sent s/p U/S of left leg  was told he had a blood clot  needed treatment

## 2021-04-23 NOTE — ED Triage Notes (Signed)
Pt has confirmed 5 cm left leg blood clot was sent here for treatment , left leg swelling

## 2021-04-23 NOTE — ED Provider Notes (Signed)
[facility]-EMERGENCY DEPARTMENT  ____________________________________________  Time seen: Approximately 10:56 PM  I have reviewed the triage vital signs and the nursing notes.   HISTORY  Chief Complaint Leg Swelling (Was sent here for 5 cm dvt left leg , )   Historian Patient     HPI Corey Howard is a 70 y.o. male presents to the emergency department referred by his primary care provider for thrombus formation on lower extremity venous ultrasound.  Patient states that he recently returned from a 3-hour trip to the beach and smokes a pack of cigarettes daily.  No recent surgery.  No chest pain, chest tightness or abdominal pain.  No prior history of DVT or PE.  No pleuritic chest pain, shortness of breath or upper back pain. No other alleviating measures have been attempted.    History reviewed. No pertinent past medical history.   Immunizations up to date:  Yes.     History reviewed. No pertinent past medical history.  Patient Active Problem List   Diagnosis Date Noted   Tobacco abuse 01/27/2017   Seborrhea 10/01/2015   Type 2 diabetes mellitus with other specified complication (HCC) 03/16/2015   Essential hypertension 03/16/2015   Hyperlipidemia associated with type 2 diabetes mellitus (HCC) 03/16/2015    Past Surgical History:  Procedure Laterality Date   TONSILLECTOMY      Prior to Admission medications   Medication Sig Start Date End Date Taking? Authorizing Provider  apixaban (ELIQUIS) 5 MG TABS tablet Take 10 mg of Eliquis in the morning and 10 mg in the evening for the first 7 days.  Then take 5 mg of Eliquis in the morning and 5 mg of Eliquis in the evening daily until discontinued by your primary care provider. 04/23/21  Yes Pia Mau M, PA-C  aspirin 81 MG tablet Take 81 mg by mouth daily.    [provider]  atorvastatin (LIPITOR) 20 MG tablet TAKE 1 TABLET BY MOUTH ONCE DAILY WITH BREAKFAST 03/30/18   Karamalegos, Netta Neat, DO  glimepiride  (AMARYL) 4 MG tablet Take 1 tablet (4 mg total) by mouth daily with breakfast. Take 2 tablets each AM. 05/07/17   Karamalegos, Netta Neat, DO  hydrocortisone 1 % ointment Apply 1 application topically 2 (two) times daily. Patient not taking: Reported on 01/27/2017 10/01/15   Janeann Forehand., MD  lisinopril (PRINIVIL,ZESTRIL) 10 MG tablet TAKE 1 TABLET BY MOUTH ONCE DAILY 03/30/18   Althea Charon, Netta Neat, DO  metFORMIN (GLUCOPHAGE) 500 MG tablet TAKE 2 TABLETS BY MOUTH TWICE DAILY 03/30/18   Smitty Cords, DO    Allergies Penicillin g  Family History  Problem Relation Age of Onset   Heart attack Mother    Prostate cancer Neg Hx    Colon cancer Neg Hx     Social History Social History   Tobacco Use   Smoking status: Every Day    Packs/day: 0.50    Years: 40.00    Pack years: 20.00    Types: Cigarettes   Smokeless tobacco: Never  Vaping Use   Vaping Use: Never used  Substance Use Topics   Alcohol use: Yes    Comment: occ   Drug use: No     Review of Systems  Constitutional: No fever/chills Eyes:  No discharge ENT: No upper respiratory complaints. Respiratory: no cough. No SOB/ use of accessory muscles to breath Gastrointestinal:   No nausea, no vomiting.  No diarrhea.  No constipation. Musculoskeletal: Patient has left leg pain and swelling.  Skin: Negative for rash, abrasions, lacerations, ecchymosis.    ____________________________________________   PHYSICAL EXAM:  VITAL SIGNS: ED Triage Vitals  Enc Vitals Group     BP 04/23/21 1618 (!) 147/87     Pulse Rate 04/23/21 1618 60     Resp 04/23/21 1618 17     Temp 04/23/21 1618 99.2 F (37.3 C)     Temp Source 04/23/21 1618 Oral     SpO2 04/23/21 1618 98 %     Weight 04/23/21 1619 218 lb 0.6 oz (98.9 kg)     Height 04/23/21 1619 6\' 1"  (1.854 m)     Head Circumference --      Peak Flow --      Pain Score 04/23/21 1619 6     Pain Loc --      Pain Edu? --      Excl. in GC? --       Constitutional: Alert and oriented. Well appearing and in no acute distress. Eyes: Conjunctivae are normal. PERRL. EOMI. Head: Atraumatic. ENT: Cardiovascular: Normal rate, regular rhythm. Normal S1 and S2.  Good peripheral circulation. Respiratory: Normal respiratory effort without tachypnea or retractions. Lungs CTAB. Good air entry to the bases with no decreased or absent breath sounds Gastrointestinal: Bowel sounds x 4 quadrants. Soft and nontender to palpation. No guarding or rigidity. No distention. Musculoskeletal: Full range of motion to all extremities. No obvious deformities noted.  Patient has erythema of left calf with 2+ pitting edema on the left.  Palpable dorsalis pedis pulse, left. Neurologic:  Normal for age. No gross focal neurologic deficits are appreciated.  Psychiatric: Mood and affect are normal for age. Speech and behavior are normal.   ____________________________________________   LABS (all labs ordered are listed, but only abnormal results are displayed)  Labs Reviewed  CBC WITH DIFFERENTIAL/PLATELET - Abnormal; Notable for the following components:      Result Value   RDW 15.6 (*)    All other components within normal limits  BASIC METABOLIC PANEL - Abnormal; Notable for the following components:   Glucose, Bld 107 (*)    BUN 29 (*)    Creatinine, Ser 1.62 (*)    GFR, Estimated 46 (*)    All other components within normal limits  PROTIME-INR   ____________________________________________  EKG   ____________________________________________  RADIOLOGY 04/25/21, personally viewed and evaluated these images (plain radiographs) as part of my medical decision making, as well as reviewing the written report by the radiologist.  Geraldo Pitter Venous Img Lower Unilateral Left (DVT)  Result Date: 04/23/2021 CLINICAL DATA:  Left lower extremity edema EXAM: LEFT LOWER EXTREMITY VENOUS DOPPLER ULTRASOUND TECHNIQUE: Gray-scale sonography with graded  compression, as well as color Doppler and duplex ultrasound were performed to evaluate the lower extremity deep venous systems from the level of the common femoral vein and including the common femoral, femoral, profunda femoral, popliteal and calf veins including the posterior tibial, peroneal and gastrocnemius veins when visible. The superficial great saphenous vein was also interrogated. Spectral Doppler was utilized to evaluate flow at rest and with distal augmentation maneuvers in the common femoral, femoral and popliteal veins. COMPARISON:  None. FINDINGS: Contralateral Common Femoral Vein: Respiratory phasicity is normal and symmetric with the symptomatic side. No evidence of thrombus. Normal compressibility. Common Femoral Vein: No evidence of thrombus. Normal compressibility, respiratory phasicity and response to augmentation. Saphenofemoral Junction: No evidence of thrombus. Normal compressibility and flow on color Doppler imaging. Profunda Femoral Vein: No evidence of thrombus.  Normal compressibility and flow on color Doppler imaging. Femoral Vein: Thrombus is noted with decreased compressibility. Popliteal Vein: Thrombus is noted with decreased compressibility. Calf Veins: Thrombus is noted with decreased compressibility in the posterior tibial vein. Superficial Great Saphenous Vein: No evidence of thrombus. Normal compressibility. Venous Reflux:  None. Other Findings:  None. IMPRESSION: Diffuse thrombus involving the left superficial femoral, popliteal and posterior tibial veins. These results will be called to the ordering clinician or representative by the Radiologist Assistant, and communication documented in the PACS or [employer]. Electronically Signed   By: Alcide Clever M.D.   On: 04/23/2021 16:05    ____________________________________________    PROCEDURES  Procedure(s) performed:     Procedures     Medications  apixaban (ELIQUIS) tablet 10 mg (10 mg Oral Given 04/23/21  2037)     ____________________________________________   INITIAL IMPRESSION / ASSESSMENT AND PLAN / ED COURSE  Pertinent labs & imaging results that were available during my care of the patient were reviewed by me and considered in my medical decision making (see chart for details).      Assessment and plan:  Leg swelling  70 year old male presents to the emergency department with swelling and pain of the left leg for the past 2 days.  Patient was hypertensive at triage and mildly bradycardic but vital signs were otherwise reassuring.  Venous ultrasound indicated diffuse thrombus involving the left superficial femoral popliteal and posterior tibial veins.  Patient was given his first dose of Eliquis in the emergency department.  He was advised to take 10 mg of Eliquis twice daily for the next 7 days and then to reduce Eliquis to 5 mg twice daily until discontinued by primary care.  He was advised to follow-up with his primary care provider, Dr. Zada Finders.  Return precautions were given to return with new or worsening symptoms.     ____________________________________________  FINAL CLINICAL IMPRESSION(S) / ED DIAGNOSES  Final diagnoses:  Leg swelling  Acute deep vein thrombosis (DVT) of popliteal vein of left lower extremity (HCC)      NEW MEDICATIONS STARTED DURING THIS VISIT:  ED Discharge Orders          Ordered    apixaban (ELIQUIS) 5 MG TABS tablet        04/23/21 2042                This chart was dictated using voice recognition software/Dragon. Despite best efforts to proofread, errors can occur which can change the meaning. Any change was purely unintentional.     Orvil Feil, PA-C 04/23/21 2306    Phineas Semen, MD 04/23/21 2308

## 2021-04-23 NOTE — Discharge Instructions (Signed)
Please take 10 mg of Eliquis in the morning and 10 mg of Eliquis at night. Then take 5 mg of Eliquis in the morning and 5 mg of Eliquis.

## 2021-06-14 ENCOUNTER — Other Ambulatory Visit: Payer: Self-pay

## 2021-06-14 ENCOUNTER — Encounter: Payer: Self-pay | Admitting: Oncology

## 2021-06-14 ENCOUNTER — Inpatient Hospital Stay: Payer: Medicare (Managed Care) | Attending: Oncology | Admitting: Oncology

## 2021-06-14 ENCOUNTER — Inpatient Hospital Stay: Payer: Medicare (Managed Care)

## 2021-06-14 VITALS — BP 143/66 | HR 60 | Temp 97.8°F | Resp 18 | Wt 210.1 lb

## 2021-06-14 DIAGNOSIS — Z7984 Long term (current) use of oral hypoglycemic drugs: Secondary | ICD-10-CM | POA: Diagnosis not present

## 2021-06-14 DIAGNOSIS — Z7982 Long term (current) use of aspirin: Secondary | ICD-10-CM | POA: Insufficient documentation

## 2021-06-14 DIAGNOSIS — I82462 Acute embolism and thrombosis of left calf muscular vein: Secondary | ICD-10-CM | POA: Diagnosis not present

## 2021-06-14 DIAGNOSIS — R768 Other specified abnormal immunological findings in serum: Secondary | ICD-10-CM | POA: Insufficient documentation

## 2021-06-14 DIAGNOSIS — Z79899 Other long term (current) drug therapy: Secondary | ICD-10-CM | POA: Diagnosis not present

## 2021-06-14 DIAGNOSIS — F17210 Nicotine dependence, cigarettes, uncomplicated: Secondary | ICD-10-CM | POA: Insufficient documentation

## 2021-06-14 DIAGNOSIS — Z7901 Long term (current) use of anticoagulants: Secondary | ICD-10-CM | POA: Diagnosis not present

## 2021-06-14 LAB — ANTITHROMBIN III: AntiThromb III Func: 91 % (ref 75–120)

## 2021-06-14 NOTE — Progress Notes (Signed)
Hematology/Oncology Consult note [facility] Telephone:([phone] [phone]  Patient Care Team: Dione Housekeeper, MD as PCP - General (Family Medicine)   Name of the patient: Corey Howard  [id number]  [birth year]    Reason for referral-acute left lower extremity DVT   Referring physician-Dr. Zada Finders  Date of visit: 06/14/21   History of presenting illness-patient is a 70 year old male who presented with symptoms of left lower extremity pain and swelling.  Ultrasound on 04/23/2021 showed diffuse thrombus involving the left superficial femoral popliteal and posterior tibial veins.  No preceding trauma, surgery, prolonged immobilizations or recent illnesses.  He has been up-to-date with his colonoscopy and EGD denies any recent unintentional weight loss.  Patient was initially started on Xarelto but he could not afford the co-pay.  He was transiently switched to Coumadin but presently on Eliquis without any significant side effects  ECOG PS- 1  Pain scale- 0   Review of systems- Review of Systems  Constitutional:  Negative for chills, fever, malaise/fatigue and weight loss.  HENT:  Negative for congestion, ear discharge and nosebleeds.   Eyes:  Negative for blurred vision.  Respiratory:  Negative for cough, hemoptysis, sputum production, shortness of breath and wheezing.   Cardiovascular:  Negative for chest pain, palpitations, orthopnea and claudication.  Gastrointestinal:  Negative for abdominal pain, blood in stool, constipation, diarrhea, heartburn, melena, nausea and vomiting.  Genitourinary:  Negative for dysuria, flank pain, frequency, hematuria and urgency.  Musculoskeletal:  Negative for back pain, joint pain and myalgias.  Skin:  Negative for rash.  Neurological:  Negative for dizziness, tingling, focal weakness, seizures, weakness and headaches.  Endo/Heme/Allergies:  Does not bruise/bleed easily.  Psychiatric/Behavioral:  Negative  for depression and suicidal ideas. The patient does not have insomnia.    Allergies  Allergen Reactions   Penicillin G Other (See Comments)    Patient Active Problem List   Diagnosis Date Noted   Tobacco abuse 01/27/2017   Seborrhea 10/01/2015   Type 2 diabetes mellitus with other specified complication (HCC) 03/16/2015   Essential hypertension 03/16/2015   Hyperlipidemia associated with type 2 diabetes mellitus (HCC) 03/16/2015     History reviewed. No pertinent past medical history.   Past Surgical History:  Procedure Laterality Date   TONSILLECTOMY      Social History   Socioeconomic History   Marital status: Single    Spouse name: Not on file   Number of children: Not on file   Years of education: Not on file   Highest education level: Not on file  Occupational History   Not on file  Tobacco Use   Smoking status: Every Day    Packs/day: 0.50    Years: 40.00    Pack years: 20.00    Types: Cigarettes   Smokeless tobacco: Never  Vaping Use   Vaping Use: Never used  Substance and Sexual Activity   Alcohol use: Yes    Comment: occ   Drug use: No   Sexual activity: Not on file  Other Topics Concern   Not on file  Social History Narrative   Not on file   Social Determinants of Health   Financial Resource Strain: Not on file  Food Insecurity: Not on file  Transportation Needs: Not on file  Physical Activity: Not on file  Stress: Not on file  Social Connections: Not on file  Intimate Partner Violence: Not on file     Family History  Problem Relation Age of Onset  Heart attack Mother    Prostate cancer Neg Hx    Colon cancer Neg Hx      Current Outpatient Medications:    apixaban (ELIQUIS) 5 MG TABS tablet, Take 10 mg of Eliquis in the morning and 10 mg in the evening for the first 7 days.  Then take 5 mg of Eliquis in the morning and 5 mg of Eliquis in the evening daily until discontinued by your primary care provider., Disp: 60 tablet, Rfl: 2    Ascorbic Acid 500 MG CHEW, Chew by mouth., Disp: , Rfl:    atorvastatin (LIPITOR) 20 MG tablet, TAKE 1 TABLET BY MOUTH ONCE DAILY WITH BREAKFAST, Disp: 90 tablet, Rfl: 0   Blood Glucose Monitoring Suppl (FIFTY50 GLUCOSE METER 2.0) w/Device KIT, See admin instructions., Disp: , Rfl:    cyanocobalamin 1000 MCG tablet, Take by mouth., Disp: , Rfl:    glimepiride (AMARYL) 4 MG tablet, Take 1 tablet (4 mg total) by mouth daily with breakfast. Take 2 tablets each AM., Disp: 180 tablet, Rfl: 0   glipiZIDE (GLUCOTROL XL) 5 MG 24 hr tablet, Take 5 mg by mouth daily., Disp: , Rfl:    glucose blood (ONETOUCH ULTRA) test strip, by XX route once daily, Disp: , Rfl:    lisinopril (ZESTRIL) 40 MG tablet, Take 40 mg by mouth daily., Disp: , Rfl:    metFORMIN (GLUCOPHAGE) 500 MG tablet, Take by mouth., Disp: , Rfl:    aspirin 81 MG tablet, Take 81 mg by mouth daily. (Patient not taking: Reported on 06/14/2021), Disp: , Rfl:    enoxaparin (LOVENOX) 80 MG/0.8ML injection, Inject into the skin. (Patient not taking: Reported on 06/14/2021), Disp: , Rfl:    FARXIGA 10 MG TABS tablet, Take 10 mg by mouth daily., Disp: , Rfl:    hydrocortisone 1 % ointment, Apply 1 application topically 2 (two) times daily. (Patient not taking: No sig reported), Disp: 30 g, Rfl: 0   lisinopril (PRINIVIL,ZESTRIL) 10 MG tablet, TAKE 1 TABLET BY MOUTH ONCE DAILY (Patient not taking: Reported on 06/14/2021), Disp: 90 tablet, Rfl: 0   warfarin (COUMADIN) 1 MG tablet, Take 1 mg by mouth daily. (Patient not taking: Reported on 06/14/2021), Disp: , Rfl:    warfarin (COUMADIN) 2 MG tablet, Take 2 mg by mouth daily. (Patient not taking: Reported on 06/14/2021), Disp: , Rfl:    Physical exam:  Vitals:   06/14/21 1337  BP: (!) 143/66  Pulse: 60  Resp: 18  Temp: 97.8 F (36.6 C)  SpO2: 99%  Weight: 210 lb 1.6 oz (95.3 kg)   Physical Exam Constitutional:      General: He is not in acute distress. Cardiovascular:     Rate and Rhythm: Normal  rate and regular rhythm.     Heart sounds: Normal heart sounds.  Pulmonary:     Effort: Pulmonary effort is normal.     Breath sounds: Normal breath sounds.  Abdominal:     General: Bowel sounds are normal.     Palpations: Abdomen is soft.  Musculoskeletal:     Comments: Left lower extremity appears mildly more swollen than the right  Skin:    General: Skin is warm and dry.  Neurological:     Mental Status: He is alert and oriented to person, place, and time.       CMP Latest Ref Rng & Units 04/23/2021  Glucose 70 - 99 mg/dL 107(H)  BUN 8 - 23 mg/dL 29(H)  Creatinine 0.61 - 1.24 mg/dL 1.62(H)  Sodium 135 - 145 mmol/L 138  Potassium 3.5 - 5.1 mmol/L 4.1  Chloride 98 - 111 mmol/L 101  CO2 22 - 32 mmol/L 27  Calcium 8.9 - 10.3 mg/dL 9.0  Total Protein 6.0 - 8.5 g/dL -  Total Bilirubin 0.0 - 1.2 mg/dL -  Alkaline Phos 39 - 117 IU/L -  AST 0 - 40 IU/L -  ALT 0 - 44 IU/L -   CBC Latest Ref Rng & Units 04/23/2021  WBC 4.0 - 10.5 K/uL 9.2  Hemoglobin 13.0 - 17.0 g/dL 13.9  Hematocrit 39.0 - 52.0 % 42.3  Platelets 150 - 400 K/uL 352     Assessment and plan- Patient is a 70 y.o. male referred for acute left lower extremity DVT  Patient has had a fairly extensive left lower extremity DVT which appears unprovoked.  No family history of DVT or PE.  Patient is on Eliquis which he will continue.  Given the unprovoked nature, proximal DVT as well as male sex it would be reasonable toContinue anticoagulation indefinitely with periodic risks versus benefit review of bleeding versus clotting.  I will do a hypercoagulable work-up at this time including factor V Leiden, prothrombin gene mutation, protein C protein S, antiphospholipid antibody panel.  Video visit with the patient in 2 weeks time     Thank you for this kind referral and the opportunity to participate in the care of this patient   Visit Diagnosis 1. Acute deep vein thrombosis (DVT) of calf muscle vein of left lower  extremity (HCC)     Dr. Randa Evens, MD, MPH [facility] at [facility] [phone] 06/14/2021

## 2021-06-15 LAB — LUPUS ANTICOAGULANT
DRVVT: 72 s — ABNORMAL HIGH (ref 0.0–47.0)
PTT Lupus Anticoagulant: 35.6 s (ref 0.0–51.9)
Thrombin Time: 16.7 s (ref 0.0–23.0)
dPT Confirm Ratio: 0.5 Ratio (ref 0.00–1.34)
dPT: 45.7 s (ref 0.0–47.6)

## 2021-06-15 LAB — DRVVT MIX: dRVVT Mix: 52.9 s — ABNORMAL HIGH (ref 0.0–40.4)

## 2021-06-15 LAB — HEXAGONAL PHASE PHOSPHOLIPID: Hex Phosph Neut Test: 9 s (ref 0–11)

## 2021-06-15 LAB — PROTEIN S PANEL
Protein S Activity: 94 % (ref 63–140)
Protein S Ag, Free: 83 % (ref 61–136)
Protein S Ag, Total: 77 % (ref 60–150)

## 2021-06-15 LAB — HEX PHASE PHOSPHOLIPID REFLEX

## 2021-06-15 LAB — DRVVT CONFIRM: dRVVT Confirm: 1.2 ratio (ref 0.8–1.2)

## 2021-06-17 LAB — PROTEIN C, TOTAL: Protein C, Total: 91 % (ref 60–150)

## 2021-06-18 LAB — BETA-2-GLYCOPROTEIN I ABS, IGG/M/A
Beta-2 Glyco I IgG: <9 GPI IgG units (ref 0–20)
Beta-2-Glycoprotein I IgA: <9 GPI IgA units (ref 0–25)
Beta-2-Glycoprotein I IgM: 12 GPI IgM units (ref 0–32)

## 2021-06-19 LAB — CARDIOLIPIN ANTIBODIES, IGG, IGM, IGA
Anticardiolipin IgA: <9 APL U/mL (ref 0–11)
Anticardiolipin IgG: 41 GPL U/mL — ABNORMAL HIGH (ref 0–14)
Anticardiolipin IgM: 102 MPL U/mL — ABNORMAL HIGH (ref 0–12)

## 2021-06-20 LAB — PROTHROMBIN GENE MUTATION

## 2021-06-21 LAB — FACTOR 5 LEIDEN

## 2021-07-03 ENCOUNTER — Other Ambulatory Visit: Payer: Self-pay

## 2021-07-03 ENCOUNTER — Inpatient Hospital Stay (HOSPITAL_BASED_OUTPATIENT_CLINIC_OR_DEPARTMENT_OTHER): Payer: Medicare (Managed Care) | Admitting: Oncology

## 2021-07-03 ENCOUNTER — Encounter: Payer: Self-pay | Admitting: Oncology

## 2021-07-03 DIAGNOSIS — I82462 Acute embolism and thrombosis of left calf muscular vein: Secondary | ICD-10-CM

## 2021-07-07 NOTE — Progress Notes (Signed)
I connected with Corey Howard on 07/07/21 at  1:45 PM EST by video enabled telemedicine visit and verified that I am speaking with the correct person using two identifiers.   I discussed the limitations, risks, security and privacy concerns of performing an evaluation and management service by telemedicine and the availability of in-person appointments. I also discussed with the patient that there may be a patient responsible charge related to this service. The patient expressed understanding and agreed to proceed.  Other persons participating in the visit and their role in the encounter:  none  Patient's location:  home Provider's location:  work  Risk analyst Complaint: Discuss results of blood work  History of present illness: patient is a 70 year old male who presented with symptoms of left lower extremity pain and swelling.  Ultrasound on 04/23/2021 showed diffuse thrombus involving the left superficial femoral popliteal and posterior tibial veins.  No preceding trauma, surgery, prolonged immobilizations or recent illnesses.  He has been up-to-date with his colonoscopy and EGD denies any recent unintentional weight loss.  Patient was initially started on Xarelto but he could not afford the co-pay.  He was transiently switched to Coumadin but presently on Eliquis without any significant side effects   Results of blood work from 06/14/2021 were as follows: Factor V Leiden and prothrombin gene mutation negative.  Protein C protein S and Antithrombin III levels were normal.  Anticardiolipin antibodies IgM levels elevated at 102 and IgG levels elevated at 41.  Beta-2 glycoprotein levels negative.  Lupus anticoagulant testing did not show any presence of lupus anticoagulant.  Interval history patient is currently doing well on Eliquis and denies any complaints presently   Review of Systems  Constitutional:  Negative for chills, fever, malaise/fatigue and weight loss.  HENT:  Negative for congestion, ear  discharge and nosebleeds.   Eyes:  Negative for blurred vision.  Respiratory:  Negative for cough, hemoptysis, sputum production, shortness of breath and wheezing.   Cardiovascular:  Negative for chest pain, palpitations, orthopnea and claudication.  Gastrointestinal:  Negative for abdominal pain, blood in stool, constipation, diarrhea, heartburn, melena, nausea and vomiting.  Genitourinary:  Negative for dysuria, flank pain, frequency, hematuria and urgency.  Musculoskeletal:  Negative for back pain, joint pain and myalgias.  Skin:  Negative for rash.  Neurological:  Negative for dizziness, tingling, focal weakness, seizures, weakness and headaches.  Endo/Heme/Allergies:  Does not bruise/bleed easily.  Psychiatric/Behavioral:  Negative for depression and suicidal ideas. The patient does not have insomnia.    Allergies  Allergen Reactions   Penicillin G Other (See Comments)    History reviewed. No pertinent past medical history.  Past Surgical History:  Procedure Laterality Date   TONSILLECTOMY      Social History   Socioeconomic History   Marital status: Single    Spouse name: Not on file   Number of children: Not on file   Years of education: Not on file   Highest education level: Not on file  Occupational History   Not on file  Tobacco Use   Smoking status: Every Day    Packs/day: 0.50    Years: 40.00    Pack years: 20.00    Types: Cigarettes   Smokeless tobacco: Never  Vaping Use   Vaping Use: Never used  Substance and Sexual Activity   Alcohol use: Yes    Comment: occ   Drug use: No   Sexual activity: Not on file  Other Topics Concern   Not on file  Social History  Narrative   Not on file   Social Determinants of Health   Financial Resource Strain: Not on file  Food Insecurity: Not on file  Transportation Needs: Not on file  Physical Activity: Not on file  Stress: Not on file  Social Connections: Not on file  Intimate Partner Violence: Not on file     Family History  Problem Relation Age of Onset   Heart attack Mother    Prostate cancer Neg Hx    Colon cancer Neg Hx      Current Outpatient Medications:    apixaban (ELIQUIS) 5 MG TABS tablet, Take 10 mg of Eliquis in the morning and 10 mg in the evening for the first 7 days.  Then take 5 mg of Eliquis in the morning and 5 mg of Eliquis in the evening daily until discontinued by your primary care provider., Disp: 60 tablet, Rfl: 2   Ascorbic Acid 500 MG CHEW, Chew by mouth., Disp: , Rfl:    atorvastatin (LIPITOR) 20 MG tablet, Take by mouth., Disp: , Rfl:    Blood Glucose Monitoring Suppl (FIFTY50 GLUCOSE METER 2.0) w/Device KIT, See admin instructions., Disp: , Rfl:    cyanocobalamin 1000 MCG tablet, Take by mouth., Disp: , Rfl:    FARXIGA 10 MG TABS tablet, Take 10 mg by mouth daily., Disp: , Rfl:    glimepiride (AMARYL) 4 MG tablet, Take 1 tablet (4 mg total) by mouth daily with breakfast. Take 2 tablets each AM., Disp: 180 tablet, Rfl: 0   glucose blood (ONETOUCH ULTRA) test strip, by XX route once daily, Disp: , Rfl:    lisinopril (ZESTRIL) 40 MG tablet, Take 40 mg by mouth daily., Disp: , Rfl:    metFORMIN (GLUCOPHAGE) 500 MG tablet, Take by mouth., Disp: , Rfl:    aspirin 81 MG tablet, Take 81 mg by mouth daily. (Patient not taking: Reported on 06/14/2021), Disp: , Rfl:    enoxaparin (LOVENOX) 80 MG/0.8ML injection, Inject into the skin. (Patient not taking: Reported on 06/14/2021), Disp: , Rfl:    glipiZIDE (GLUCOTROL XL) 5 MG 24 hr tablet, Take 5 mg by mouth daily. (Patient not taking: Reported on 07/03/2021), Disp: , Rfl:    hydrocortisone 1 % ointment, Apply 1 application topically 2 (two) times daily. (Patient not taking: Reported on 01/27/2017), Disp: 30 g, Rfl: 0   lisinopril (PRINIVIL,ZESTRIL) 10 MG tablet, TAKE 1 TABLET BY MOUTH ONCE DAILY (Patient not taking: Reported on 06/14/2021), Disp: 90 tablet, Rfl: 0   warfarin (COUMADIN) 1 MG tablet, Take 1 mg by mouth daily.  (Patient not taking: Reported on 06/14/2021), Disp: , Rfl:    warfarin (COUMADIN) 2 MG tablet, Take 2 mg by mouth daily. (Patient not taking: Reported on 06/14/2021), Disp: , Rfl:   No results found.  No images are attached to the encounter.   CMP Latest Ref Rng & Units 04/23/2021  Glucose 70 - 99 mg/dL 107(H)  BUN 8 - 23 mg/dL 29(H)  Creatinine 0.61 - 1.24 mg/dL 1.62(H)  Sodium 135 - 145 mmol/L 138  Potassium 3.5 - 5.1 mmol/L 4.1  Chloride 98 - 111 mmol/L 101  CO2 22 - 32 mmol/L 27  Calcium 8.9 - 10.3 mg/dL 9.0  Total Protein 6.0 - 8.5 g/dL -  Total Bilirubin 0.0 - 1.2 mg/dL -  Alkaline Phos 39 - 117 IU/L -  AST 0 - 40 IU/L -  ALT 0 - 44 IU/L -   CBC Latest Ref Rng & Units 04/23/2021  WBC 4.0 -  10.5 K/uL 9.2  Hemoglobin 13.0 - 17.0 g/dL 13.9  Hematocrit 39.0 - 52.0 % 42.3  Platelets 150 - 400 K/uL 352     Observation/objective: Appears in no acute distress over video visit today.  Breathing is nonlabored  Assessment and plan: Patient is a 70 year old male with acute left lower extremity DVT unprovoked here to discuss results of hypercoagulable work-up and further management  Hypercoagulable work-up shows elevated IgM anticardiolipin antititer of 101 as well as IgGTiter of 41 which is significant.  It is unclear if the patient has antiphospholipid antibody syndrome at this time based on 1 value.  I will therefore be repeating his anticardiolipin antibody titers again in 12 weeks from the first lab which would be sometime in February.  I will see him for a video visit about a week or 2 after that.  If repeat labs confirm that his titers are more than 40 then a diagnosis of antiphospholipid antibody syndrome would be made and he would have to be switched from Eliquis to Coumadin at that time  Follow-up instructions:as above  I discussed the assessment and treatment plan with the patient. The patient was provided an opportunity to ask questions and all were answered. The patient  agreed with the plan and demonstrated an understanding of the instructions.   The patient was advised to call back or seek an in-person evaluation if the symptoms worsen or if the condition fails to improve as anticipated.   Visit Diagnosis: 1. Acute deep vein thrombosis (DVT) of calf muscle vein of left lower extremity (HCC)     Dr. Randa Evens, MD, MPH [facility] at [facility] Tel- [phone] 07/07/2021 7:55 PM

## 2021-09-11 ENCOUNTER — Inpatient Hospital Stay: Payer: Medicare (Managed Care)

## 2021-09-12 ENCOUNTER — Telehealth: Payer: Self-pay | Admitting: *Deleted

## 2021-09-12 NOTE — Telephone Encounter (Signed)
Pt contacted because he missed the 2/1 lab appt.jennifer called him and r/s but then he wanted to know if she should keep eliquis going until he sees her and the answer is yes. His new appt 2/6 and he said he will be there. And also has appt 2/22 to see Smith Robert as video visit. Pt knows both appts

## 2021-09-16 ENCOUNTER — Other Ambulatory Visit: Payer: Self-pay | Admitting: *Deleted

## 2021-09-16 ENCOUNTER — Other Ambulatory Visit: Payer: Self-pay

## 2021-09-16 ENCOUNTER — Telehealth: Payer: Self-pay | Admitting: *Deleted

## 2021-09-16 ENCOUNTER — Inpatient Hospital Stay: Payer: Medicare (Managed Care) | Attending: Oncology

## 2021-09-16 ENCOUNTER — Other Ambulatory Visit (HOSPITAL_COMMUNITY): Payer: Self-pay

## 2021-09-16 DIAGNOSIS — D6861 Antiphospholipid syndrome: Secondary | ICD-10-CM | POA: Insufficient documentation

## 2021-09-16 DIAGNOSIS — Z7901 Long term (current) use of anticoagulants: Secondary | ICD-10-CM | POA: Insufficient documentation

## 2021-09-16 DIAGNOSIS — Z79899 Other long term (current) drug therapy: Secondary | ICD-10-CM | POA: Diagnosis not present

## 2021-09-16 DIAGNOSIS — I82462 Acute embolism and thrombosis of left calf muscular vein: Secondary | ICD-10-CM

## 2021-09-16 DIAGNOSIS — F17210 Nicotine dependence, cigarettes, uncomplicated: Secondary | ICD-10-CM | POA: Diagnosis not present

## 2021-09-16 DIAGNOSIS — I82512 Chronic embolism and thrombosis of left femoral vein: Secondary | ICD-10-CM | POA: Insufficient documentation

## 2021-09-16 MED ORDER — APIXABAN 5 MG PO TABS
5.0000 mg | ORAL_TABLET | Freq: Two times a day (BID) | ORAL | 0 refills | Status: DC
  Filled 2021-09-16: qty 60, 30d supply, fill #0

## 2021-09-16 NOTE — Telephone Encounter (Signed)
Pt reached out to [facility] and he has never had a 30 day trial for free. Put in order for 30 days of eliquis and then pt can use 10 dollar card after that. Put in order for free 30 day at [facility]. The pt is coming to see [place] on 2/22 2023 . Will need to figure out if he still needs to be on eliquis .

## 2021-09-18 LAB — BETA-2-GLYCOPROTEIN I ABS, IGG/M/A
Beta-2 Glyco I IgG: 13 GPI IgG units (ref 0–20)
Beta-2-Glycoprotein I IgA: <9 GPI IgA units (ref 0–25)
Beta-2-Glycoprotein I IgM: 11 GPI IgM units (ref 0–32)

## 2021-09-19 LAB — CARDIOLIPIN ANTIBODIES, IGG, IGM, IGA
Anticardiolipin IgA: <9 APL U/mL (ref 0–11)
Anticardiolipin IgG: 42 GPL U/mL — ABNORMAL HIGH (ref 0–14)
Anticardiolipin IgM: 75 MPL U/mL — ABNORMAL HIGH (ref 0–12)

## 2021-09-25 ENCOUNTER — Telehealth: Payer: Medicare (Managed Care) | Admitting: Oncology

## 2021-10-02 ENCOUNTER — Encounter: Payer: Self-pay | Admitting: Oncology

## 2021-10-02 ENCOUNTER — Inpatient Hospital Stay (HOSPITAL_BASED_OUTPATIENT_CLINIC_OR_DEPARTMENT_OTHER): Payer: Medicare (Managed Care) | Admitting: Oncology

## 2021-10-02 DIAGNOSIS — D6861 Antiphospholipid syndrome: Secondary | ICD-10-CM

## 2021-10-02 DIAGNOSIS — I82462 Acute embolism and thrombosis of left calf muscular vein: Secondary | ICD-10-CM

## 2021-10-02 DIAGNOSIS — I82512 Chronic embolism and thrombosis of left femoral vein: Secondary | ICD-10-CM

## 2021-10-02 NOTE — Progress Notes (Signed)
Neuropathy of bilateral feet. About 2 years his left leg back to normal size.

## 2021-10-03 NOTE — Progress Notes (Signed)
Hematology/Oncology Consult note [facility]  Telephone:([phone] Fax:([phone]  Patient Care Team: Valera Castle, MD as PCP - General (Family Medicine)   Name of the patient: Corey Howard  [id number]  07-20-1951   Date of visit: 10/03/21  Diagnosis-antiphospholipid antibody syndrome  Chief complaint/ Reason for visit-discussed results of blood work and chronic anticoagulation management  Heme/Onc history: patient is a 71 year old male who presented with symptoms of left lower extremity pain and swelling.  Ultrasound on 04/23/2021 showed diffuse thrombus involving the left superficial femoral popliteal and posterior tibial veins.  No preceding trauma, surgery, prolonged immobilizations or recent illnesses.  He has been up-to-date with his colonoscopy and EGD denies any recent unintentional weight loss.  Patient was initially started on Xarelto but he could not afford the co-pay.  He was transiently switched to Coumadin but presently on Eliquis without any significant side effects   Results of blood work from 06/14/2021 were as follows: Factor V Leiden and prothrombin gene mutation negative.  Protein C protein S and Antithrombin III levels were normal.  Anticardiolipin antibodies IgM levels elevated at 102 and IgG levels elevated at 41. Repeat cardiolipin antibodies in February 2023 were again elevated with an IgG titer of 42 and IgM titer of 75 thereby consistent with diagnosis of antiphospholipid antibody syndrome. Beta-2 glycoprotein levels negative.  Lupus anticoagulant testing did not show any presence of lupus anticoagulant.  Interval history-presently doing well on Eliquis and denies any specific complaints at this time  ECOG PS- 1 Pain scale- 0   Review of systems- Review of Systems  Constitutional:  Negative for chills, fever, malaise/fatigue and weight loss.  HENT:  Negative for congestion, ear discharge and nosebleeds.   Eyes:   Negative for blurred vision.  Respiratory:  Negative for cough, hemoptysis, sputum production, shortness of breath and wheezing.   Cardiovascular:  Negative for chest pain, palpitations, orthopnea and claudication.  Gastrointestinal:  Negative for abdominal pain, blood in stool, constipation, diarrhea, heartburn, melena, nausea and vomiting.  Genitourinary:  Negative for dysuria, flank pain, frequency, hematuria and urgency.  Musculoskeletal:  Negative for back pain, joint pain and myalgias.  Skin:  Negative for rash.  Neurological:  Negative for dizziness, tingling, focal weakness, seizures, weakness and headaches.  Endo/Heme/Allergies:  Does not bruise/bleed easily.  Psychiatric/Behavioral:  Negative for depression and suicidal ideas. The patient does not have insomnia.       Allergies  Allergen Reactions   Penicillin G Other (See Comments)     Past Medical History:  Diagnosis Date   DVT (deep venous thrombosis) (HCC)      Past Surgical History:  Procedure Laterality Date   TONSILLECTOMY      Social History   Socioeconomic History   Marital status: Single    Spouse name: Not on file   Number of children: Not on file   Years of education: Not on file   Highest education level: Not on file  Occupational History   Not on file  Tobacco Use   Smoking status: Every Day    Packs/day: 0.50    Years: 40.00    Pack years: 20.00    Types: Cigarettes   Smokeless tobacco: Never  Vaping Use   Vaping Use: Never used  Substance and Sexual Activity   Alcohol use: Not Currently   Drug use: No   Sexual activity: Not Currently  Other Topics Concern   Not on file  Social History Narrative   Not on  file   Social Determinants of Health   Financial Resource Strain: Not on file  Food Insecurity: Not on file  Transportation Needs: Not on file  Physical Activity: Not on file  Stress: Not on file  Social Connections: Not on file  Intimate Partner Violence: Not on file     Family History  Problem Relation Age of Onset   Heart attack Mother    Prostate cancer Neg Hx    Colon cancer Neg Hx      Current Outpatient Medications:    apixaban (ELIQUIS) 5 MG TABS tablet, Take 1 tablet (5 mg total) by mouth 2 (two) times daily., Disp: 60 tablet, Rfl: 0   atorvastatin (LIPITOR) 20 MG tablet, Take 20 mg by mouth daily., Disp: , Rfl:    Blood Glucose Monitoring Suppl (FIFTY50 GLUCOSE METER 2.0) w/Device KIT, See admin instructions., Disp: , Rfl:    cyanocobalamin 1000 MCG tablet, Take by mouth daily., Disp: , Rfl:    FARXIGA 10 MG TABS tablet, Take 10 mg by mouth daily., Disp: , Rfl:    glimepiride (AMARYL) 4 MG tablet, Take 1 tablet (4 mg total) by mouth daily with breakfast. Take 2 tablets each AM. (Patient taking differently: Take 4 mg by mouth daily with breakfast.), Disp: 180 tablet, Rfl: 0   glipiZIDE (GLUCOTROL XL) 5 MG 24 hr tablet, Take 5 mg by mouth daily., Disp: , Rfl:    glucose blood (ONETOUCH ULTRA) test strip, by XX route once daily, Disp: , Rfl:    hydrocortisone 1 % ointment, Apply 1 application topically 2 (two) times daily., Disp: 30 g, Rfl: 0   lisinopril (ZESTRIL) 40 MG tablet, Take 40 mg by mouth daily., Disp: , Rfl:    metFORMIN (GLUCOPHAGE) 500 MG tablet, Take 1,000 mg by mouth 2 (two) times daily with a meal., Disp: , Rfl:   Physical exam:  Physical Exam Constitutional:      General: He is not in acute distress. Cardiovascular:     Rate and Rhythm: Normal rate and regular rhythm.     Heart sounds: Normal heart sounds.  Pulmonary:     Effort: Pulmonary effort is normal.  Skin:    General: Skin is warm and dry.  Neurological:     Mental Status: He is alert and oriented to person, place, and time.     CMP Latest Ref Rng & Units 04/23/2021  Glucose 70 - 99 mg/dL 107(H)  BUN 8 - 23 mg/dL 29(H)  Creatinine 0.61 - 1.24 mg/dL 1.62(H)  Sodium 135 - 145 mmol/L 138  Potassium 3.5 - 5.1 mmol/L 4.1  Chloride 98 - 111 mmol/L 101  CO2 22 -  32 mmol/L 27  Calcium 8.9 - 10.3 mg/dL 9.0  Total Protein 6.0 - 8.5 g/dL -  Total Bilirubin 0.0 - 1.2 mg/dL -  Alkaline Phos 39 - 117 IU/L -  AST 0 - 40 IU/L -  ALT 0 - 44 IU/L -   CBC Latest Ref Rng & Units 04/23/2021  WBC 4.0 - 10.5 K/uL 9.2  Hemoglobin 13.0 - 17.0 g/dL 13.9  Hematocrit 39.0 - 52.0 % 42.3  Platelets 150 - 400 K/uL 352     Assessment and plan- Patient is a 71 y.o. male with history of unprovoked right lower extremity DVT and labs consistent with antiphospholipid antibody syndrome  Anticardiolipin antibodies both IgM and IgG titers are persistently elevated at greater than 43 months apart.  In the setting of right lower extremity DVT this would be consistent  with antiphospholipid antibody syndrome.  Discussed with the patient that an APS Coumadin has been shown to be better than newer anticoagulants in terms of reducing the risk of recurrent DVT.  This effect was seen better in patients who had arterial events as well as were triple positive.  Patient has had a venous event and only has 1 out of the 3 laparotomy pads which are abnormal.  Regardless it would still be reasonable to consider Coumadin over Eliquis in this setting.  However that would require frequent PT/INR checks as well as Lovenox injections initially until he can get therapeutic on Coumadin.  Patient understands the pros and cons of Coumadin and wishes to continue with Eliquis at this time.  He has been on it now for over 4 months and has not had any recurrent clotting events.  I will see him back in 6 months with repeat labs.    Discussed that with antiphospholipid antibody syndrome he has a lifetime increased risk of arterial as well as venous thrombi including heart attacks and strokes.  Recommend good control of cardiovascular risk factors in addition to continuing anticoagulation lifelong.  Patient verbalized understanding   Visit Diagnosis 1. Chronic deep vein thrombosis (DVT) of femoral vein of left lower  extremity (HCC)   2. Antiphospholipid antibody syndrome ([place])      Dr. Randa Evens, MD, MPH [facility] at [facility] [phone] 10/03/2021 8:55 AM

## 2022-04-07 ENCOUNTER — Inpatient Hospital Stay: Payer: Medicare PPO | Attending: Oncology

## 2022-04-07 DIAGNOSIS — D6861 Antiphospholipid syndrome: Secondary | ICD-10-CM | POA: Diagnosis not present

## 2022-04-07 DIAGNOSIS — I82512 Chronic embolism and thrombosis of left femoral vein: Secondary | ICD-10-CM

## 2022-04-07 LAB — CBC WITH DIFFERENTIAL/PLATELET
Abs Immature Granulocytes: 0.04 10*3/uL (ref 0.00–0.07)
Basophils Absolute: 0.1 10*3/uL (ref 0.0–0.1)
Basophils Relative: 1 %
Eosinophils Absolute: 0.1 10*3/uL (ref 0.0–0.5)
Eosinophils Relative: 1 %
HCT: 51.3 % (ref 39.0–52.0)
Hemoglobin: 17.1 g/dL — ABNORMAL HIGH (ref 13.0–17.0)
Immature Granulocytes: 0 %
Lymphocytes Relative: 17 %
Lymphs Abs: 1.7 10*3/uL (ref 0.7–4.0)
MCH: 29.3 pg (ref 26.0–34.0)
MCHC: 33.3 g/dL (ref 30.0–36.0)
MCV: 87.8 fL (ref 80.0–100.0)
Monocytes Absolute: 0.8 10*3/uL (ref 0.1–1.0)
Monocytes Relative: 8 %
Neutro Abs: 7.4 10*3/uL (ref 1.7–7.7)
Neutrophils Relative %: 73 %
Platelets: 243 10*3/uL (ref 150–400)
RBC: 5.84 MIL/uL — ABNORMAL HIGH (ref 4.22–5.81)
RDW: 14.4 % (ref 11.5–15.5)
WBC: 10.3 10*3/uL (ref 4.0–10.5)
nRBC: 0 % (ref 0.0–0.2)

## 2022-04-07 LAB — COMPREHENSIVE METABOLIC PANEL
ALT: 13 U/L (ref 0–44)
AST: 15 U/L (ref 15–41)
Albumin: 4 g/dL (ref 3.5–5.0)
Alkaline Phosphatase: 93 U/L (ref 38–126)
Anion gap: 9 (ref 5–15)
BUN: 37 mg/dL — ABNORMAL HIGH (ref 8–23)
CO2: 24 mmol/L (ref 22–32)
Calcium: 8.9 mg/dL (ref 8.9–10.3)
Chloride: 104 mmol/L (ref 98–111)
Creatinine, Ser: 2.44 mg/dL — ABNORMAL HIGH (ref 0.61–1.24)
GFR, Estimated: 28 mL/min — ABNORMAL LOW (ref >60)
Glucose, Bld: 127 mg/dL — ABNORMAL HIGH (ref 70–99)
Potassium: 4.3 mmol/L (ref 3.5–5.1)
Sodium: 137 mmol/L (ref 135–145)
Total Bilirubin: 1.4 mg/dL — ABNORMAL HIGH (ref 0.3–1.2)
Total Protein: 6.9 g/dL (ref 6.5–8.1)

## 2022-04-07 NOTE — Progress Notes (Signed)
Request has been sent to PCP to follow pts worsening creatine and receieved a fax confirmation.

## 2022-04-11 ENCOUNTER — Inpatient Hospital Stay: Payer: Medicare PPO | Attending: Oncology | Admitting: Oncology

## 2022-04-11 VITALS — BP 152/90 | HR 63 | Temp 97.8°F | Resp 16 | Wt 198.0 lb

## 2022-04-11 DIAGNOSIS — Z86718 Personal history of other venous thrombosis and embolism: Secondary | ICD-10-CM | POA: Insufficient documentation

## 2022-04-11 DIAGNOSIS — Z7901 Long term (current) use of anticoagulants: Secondary | ICD-10-CM | POA: Insufficient documentation

## 2022-04-11 DIAGNOSIS — I82512 Chronic embolism and thrombosis of left femoral vein: Secondary | ICD-10-CM

## 2022-04-11 DIAGNOSIS — D6861 Antiphospholipid syndrome: Secondary | ICD-10-CM | POA: Diagnosis present

## 2022-04-11 DIAGNOSIS — D751 Secondary polycythemia: Secondary | ICD-10-CM | POA: Diagnosis not present

## 2022-04-11 DIAGNOSIS — F17210 Nicotine dependence, cigarettes, uncomplicated: Secondary | ICD-10-CM | POA: Diagnosis not present

## 2022-04-11 NOTE — Progress Notes (Unsigned)
Returns for 6 mo follow-up. Continues eliquis. Notes easy bruising.

## 2022-04-13 ENCOUNTER — Encounter: Payer: Self-pay | Admitting: Oncology

## 2022-04-13 NOTE — Progress Notes (Signed)
Hematology/Oncology Consult note [facility]  Telephone:([phone] Fax:([phone]  Patient Care Team: Valera Castle, MD as PCP - General (Family Medicine)   Name of the patient: Corey Howard  [id number]  08-17-1950   Date of visit: 04/13/22  Diagnosis- antiphospholipid antibody syndrome  Chief complaint/ Reason for visit-routine follow-up of antiphospholipid antibody syndrome on chronic anticoagulation  Heme/Onc history: patient is a 71 year old male who presented with symptoms of left lower extremity pain and swelling.  Ultrasound on 04/23/2021 showed diffuse thrombus involving the left superficial femoral popliteal and posterior tibial veins.  No preceding trauma, surgery, prolonged immobilizations or recent illnesses.  He has been up-to-date with his colonoscopy and EGD denies any recent unintentional weight loss.  Patient was initially started on Xarelto but he could not afford the co-pay.  He was transiently switched to Coumadin but presently on Eliquis without any significant side effects   Results of blood work from 06/14/2021 were as follows: Factor V Leiden and prothrombin gene mutation negative.  Protein C protein S and Antithrombin III levels were normal.  Anticardiolipin antibodies IgM levels elevated at 102 and IgG levels elevated at 41. Repeat cardiolipin antibodies in February 2023 were again elevated with an IgG titer of 42 and IgM titer of 75 thereby consistent with diagnosis of antiphospholipid antibody syndrome. Beta-2 glycoprotein levels negative.  Lupus anticoagulant testing did not show any presence of lupus anticoagulant.    Interval history-patient waiting Eliquis well without any significant side effects.  He has not had any recurrent episodes of symptomatic DVT.  He is caring for his sick friend and therefore admits being under caregiver stress.  ECOG PS- 1 Pain scale- 0   Review of systems- Review of Systems   Constitutional:  Negative for chills, fever, malaise/fatigue and weight loss.  HENT:  Negative for congestion, ear discharge and nosebleeds.   Eyes:  Negative for blurred vision.  Respiratory:  Negative for cough, hemoptysis, sputum production, shortness of breath and wheezing.   Cardiovascular:  Negative for chest pain, palpitations, orthopnea and claudication.  Gastrointestinal:  Negative for abdominal pain, blood in stool, constipation, diarrhea, heartburn, melena, nausea and vomiting.  Genitourinary:  Negative for dysuria, flank pain, frequency, hematuria and urgency.  Musculoskeletal:  Negative for back pain, joint pain and myalgias.  Skin:  Negative for rash.  Neurological:  Negative for dizziness, tingling, focal weakness, seizures, weakness and headaches.  Endo/Heme/Allergies:  Does not bruise/bleed easily.  Psychiatric/Behavioral:  Negative for depression and suicidal ideas. The patient does not have insomnia.       Allergies  Allergen Reactions   Penicillin G Other (See Comments)     Past Medical History:  Diagnosis Date   DVT (deep venous thrombosis) (HCC)      Past Surgical History:  Procedure Laterality Date   TONSILLECTOMY      Social History   Socioeconomic History   Marital status: Single    Spouse name: Not on file   Number of children: Not on file   Years of education: Not on file   Highest education level: Not on file  Occupational History   Not on file  Tobacco Use   Smoking status: Every Day    Packs/day: 0.50    Years: 40.00    Total pack years: 20.00    Types: Cigarettes   Smokeless tobacco: Never  Vaping Use   Vaping Use: Never used  Substance and Sexual Activity   Alcohol use: Not Currently  Drug use: No   Sexual activity: Not Currently  Other Topics Concern   Not on file  Social History Narrative   Not on file   Social Determinants of Health   Financial Resource Strain: Not on file  Food Insecurity: Not on file   Transportation Needs: Not on file  Physical Activity: Not on file  Stress: Not on file  Social Connections: Not on file  Intimate Partner Violence: Not on file    Family History  Problem Relation Age of Onset   Heart attack Mother    Prostate cancer Neg Hx    Colon cancer Neg Hx      Current Outpatient Medications:    apixaban (ELIQUIS) 5 MG TABS tablet, Take 1 tablet (5 mg total) by mouth 2 (two) times daily., Disp: 60 tablet, Rfl: 0   atorvastatin (LIPITOR) 20 MG tablet, Take 20 mg by mouth daily., Disp: , Rfl:    Blood Glucose Monitoring Suppl (FIFTY50 GLUCOSE METER 2.0) w/Device KIT, See admin instructions., Disp: , Rfl:    cyanocobalamin 1000 MCG tablet, Take by mouth daily., Disp: , Rfl:    FARXIGA 10 MG TABS tablet, Take 10 mg by mouth daily., Disp: , Rfl:    glimepiride (AMARYL) 4 MG tablet, Take 1 tablet (4 mg total) by mouth daily with breakfast. Take 2 tablets each AM. (Patient taking differently: Take 4 mg by mouth daily with breakfast.), Disp: 180 tablet, Rfl: 0   glucose blood (ONETOUCH ULTRA) test strip, by XX route once daily, Disp: , Rfl:    lisinopril (ZESTRIL) 40 MG tablet, Take 40 mg by mouth daily., Disp: , Rfl:    glipiZIDE (GLUCOTROL XL) 5 MG 24 hr tablet, Take 5 mg by mouth daily. (Patient not taking: Reported on 04/11/2022), Disp: , Rfl:    hydrocortisone 1 % ointment, Apply 1 application topically 2 (two) times daily. (Patient not taking: Reported on 04/11/2022), Disp: 30 g, Rfl: 0   metFORMIN (GLUCOPHAGE) 500 MG tablet, Take 1,000 mg by mouth 2 (two) times daily with a meal. (Patient not taking: Reported on 04/11/2022), Disp: , Rfl:   Physical exam:  Vitals:   04/11/22 1400  BP: (!) 152/90  Pulse: 63  Resp: 16  Temp: 97.8 F (36.6 C)  TempSrc: Tympanic  SpO2: 97%  Weight: 198 lb (89.8 kg)   Physical Exam Constitutional:      General: He is not in acute distress. Cardiovascular:     Rate and Rhythm: Normal rate and regular rhythm.     Heart sounds:  Normal heart sounds.  Pulmonary:     Effort: Pulmonary effort is normal.     Breath sounds: Normal breath sounds.  Abdominal:     General: Bowel sounds are normal.     Palpations: Abdomen is soft.  Skin:    General: Skin is warm and dry.  Neurological:     Mental Status: He is alert and oriented to person, place, and time.         Latest Ref Rng & Units 04/07/2022   12:54 PM  CMP  Glucose 70 - 99 mg/dL 127   BUN 8 - 23 mg/dL 37   Creatinine 0.61 - 1.24 mg/dL 2.44   Sodium 135 - 145 mmol/L 137   Potassium 3.5 - 5.1 mmol/L 4.3   Chloride 98 - 111 mmol/L 104   CO2 22 - 32 mmol/L 24   Calcium 8.9 - 10.3 mg/dL 8.9   Total Protein 6.5 - 8.1 g/dL 6.9  Total Bilirubin 0.3 - 1.2 mg/dL 1.4   Alkaline Phos 38 - 126 U/L 93   AST 15 - 41 U/L 15   ALT 0 - 44 U/L 13       Latest Ref Rng & Units 04/07/2022   12:54 PM  CBC  WBC 4.0 - 10.5 K/uL 10.3   Hemoglobin 13.0 - 17.0 g/dL 17.1   Hematocrit 39.0 - 52.0 % 51.3   Platelets 150 - 400 K/uL 243      Assessment and plan- Patient is a 71 y.o. male with history of antiphospholipid antibody syndrome on Eliquis here for routine followFollow-up Clinically patient is doing well with no recurrent episodes of DVT or PE.    Patient understands that Coumadin is a better anticoagulant of choice in antiphospholipid antibody syndrome.  I also explained to him that recently his kidney functions have been getting worse and his GFR based on today's lab was calculated at 28%.  He is following up with nephrology for this.  Coumadin is a safer anticoagulant in patients with chronic kidney disease.  However given more convenience of using Eliquis without monitoring he still prefers that over Coumadin.  Today's lab patient also has mild polycythemia with a hemoglobin of 17.1.  Unclear if it secondary to dehydration.I have asked the patient to improve his fluid intake and we will repeat his CBC again in 1 month's time.  I will see him back in 6 months    Visit Diagnosis 1. Chronic deep vein thrombosis (DVT) of femoral vein of left lower extremity (HCC)   2. Antiphospholipid antibody syndrome (HCC)   3. Polycythemia      Dr. Randa Evens, MD, MPH [facility] at [facility] [phone] 04/13/2022 4:09 PM

## 2022-05-12 ENCOUNTER — Inpatient Hospital Stay: Payer: Medicare PPO | Attending: Oncology

## 2022-05-12 DIAGNOSIS — I82512 Chronic embolism and thrombosis of left femoral vein: Secondary | ICD-10-CM

## 2022-05-12 DIAGNOSIS — D6861 Antiphospholipid syndrome: Secondary | ICD-10-CM | POA: Insufficient documentation

## 2022-05-12 LAB — COMPREHENSIVE METABOLIC PANEL
ALT: 12 U/L (ref 0–44)
AST: 13 U/L — ABNORMAL LOW (ref 15–41)
Albumin: 3.8 g/dL (ref 3.5–5.0)
Alkaline Phosphatase: 84 U/L (ref 38–126)
Anion gap: 3 — ABNORMAL LOW (ref 5–15)
BUN: 19 mg/dL (ref 8–23)
CO2: 28 mmol/L (ref 22–32)
Calcium: 8.7 mg/dL — ABNORMAL LOW (ref 8.9–10.3)
Chloride: 107 mmol/L (ref 98–111)
Creatinine, Ser: 1.65 mg/dL — ABNORMAL HIGH (ref 0.61–1.24)
GFR, Estimated: 45 mL/min — ABNORMAL LOW (ref >60)
Glucose, Bld: 120 mg/dL — ABNORMAL HIGH (ref 70–99)
Potassium: 4.3 mmol/L (ref 3.5–5.1)
Sodium: 138 mmol/L (ref 135–145)
Total Bilirubin: 0.9 mg/dL (ref 0.3–1.2)
Total Protein: 6.7 g/dL (ref 6.5–8.1)

## 2022-05-12 LAB — CBC WITH DIFFERENTIAL/PLATELET
Abs Immature Granulocytes: 0.02 10*3/uL (ref 0.00–0.07)
Basophils Absolute: 0.1 10*3/uL (ref 0.0–0.1)
Basophils Relative: 1 %
Eosinophils Absolute: 0.1 10*3/uL (ref 0.0–0.5)
Eosinophils Relative: 1 %
HCT: 49.1 % (ref 39.0–52.0)
Hemoglobin: 16.3 g/dL (ref 13.0–17.0)
Immature Granulocytes: 0 %
Lymphocytes Relative: 16 %
Lymphs Abs: 1.3 10*3/uL (ref 0.7–4.0)
MCH: 29.1 pg (ref 26.0–34.0)
MCHC: 33.2 g/dL (ref 30.0–36.0)
MCV: 87.7 fL (ref 80.0–100.0)
Monocytes Absolute: 0.7 10*3/uL (ref 0.1–1.0)
Monocytes Relative: 9 %
Neutro Abs: 6 10*3/uL (ref 1.7–7.7)
Neutrophils Relative %: 73 %
Platelets: 226 10*3/uL (ref 150–400)
RBC: 5.6 MIL/uL (ref 4.22–5.81)
RDW: 14.2 % (ref 11.5–15.5)
WBC: 8.1 10*3/uL (ref 4.0–10.5)
nRBC: 0 % (ref 0.0–0.2)

## 2022-10-02 DIAGNOSIS — N1832 Chronic kidney disease, stage 3b: Secondary | ICD-10-CM | POA: Diagnosis not present

## 2022-10-02 DIAGNOSIS — I10 Essential (primary) hypertension: Secondary | ICD-10-CM | POA: Diagnosis not present

## 2022-10-02 DIAGNOSIS — E1122 Type 2 diabetes mellitus with diabetic chronic kidney disease: Secondary | ICD-10-CM | POA: Diagnosis not present

## 2022-10-02 DIAGNOSIS — N2581 Secondary hyperparathyroidism of renal origin: Secondary | ICD-10-CM | POA: Diagnosis not present

## 2022-10-14 ENCOUNTER — Inpatient Hospital Stay (HOSPITAL_BASED_OUTPATIENT_CLINIC_OR_DEPARTMENT_OTHER): Payer: No Typology Code available for payment source | Admitting: Nurse Practitioner

## 2022-10-14 ENCOUNTER — Inpatient Hospital Stay: Payer: No Typology Code available for payment source | Attending: Nurse Practitioner

## 2022-10-14 ENCOUNTER — Inpatient Hospital Stay: Payer: Self-pay | Admitting: Nurse Practitioner

## 2022-10-14 ENCOUNTER — Inpatient Hospital Stay: Payer: Self-pay

## 2022-10-14 VITALS — BP 163/87 | HR 60 | Temp 97.0°F | Wt 197.4 lb

## 2022-10-14 DIAGNOSIS — F17210 Nicotine dependence, cigarettes, uncomplicated: Secondary | ICD-10-CM | POA: Diagnosis not present

## 2022-10-14 DIAGNOSIS — Z7901 Long term (current) use of anticoagulants: Secondary | ICD-10-CM | POA: Insufficient documentation

## 2022-10-14 DIAGNOSIS — D6861 Antiphospholipid syndrome: Secondary | ICD-10-CM | POA: Insufficient documentation

## 2022-10-14 DIAGNOSIS — Z79899 Other long term (current) drug therapy: Secondary | ICD-10-CM

## 2022-10-14 DIAGNOSIS — I82512 Chronic embolism and thrombosis of left femoral vein: Secondary | ICD-10-CM

## 2022-10-14 DIAGNOSIS — R768 Other specified abnormal immunological findings in serum: Secondary | ICD-10-CM | POA: Diagnosis not present

## 2022-10-14 LAB — CBC
HCT: 49.9 % (ref 39.0–52.0)
Hemoglobin: 16.4 g/dL (ref 13.0–17.0)
MCH: 29.4 pg (ref 26.0–34.0)
MCHC: 32.9 g/dL (ref 30.0–36.0)
MCV: 89.6 fL (ref 80.0–100.0)
Platelets: 256 10*3/uL (ref 150–400)
RBC: 5.57 MIL/uL (ref 4.22–5.81)
RDW: 13.5 % (ref 11.5–15.5)
WBC: 9.9 10*3/uL (ref 4.0–10.5)
nRBC: 0 % (ref 0.0–0.2)

## 2022-10-14 MED ORDER — APIXABAN 5 MG PO TABS: 5.0000 mg | ORAL_TABLET | Freq: Two times a day (BID) | ORAL | 1 refills | Status: DC

## 2022-10-14 NOTE — Progress Notes (Signed)
Hematology/Oncology Consult Note [facility]  Telephone:([phone] Fax:([phone]  Patient Care Team: Valera Castle, MD as PCP - General (Family Medicine)   Name of the patient: Corey Brutto  Howard:[id number]  06/15/1951   Date of visit: 10/14/22  Diagnosis- antiphospholipid antibody syndrome  Chief complaint/ Reason for visit- routine follow-up of antiphospholipid antibody syndrome on chronic anticoagulation  Heme/Onc history: patient presented as a 72 year old male who presented with symptoms of left lower extremity pain and swelling.  Ultrasound on 04/23/2021 showed diffuse thrombus involving the left superficial femoral popliteal and posterior tibial veins.  No preceding trauma, surgery, prolonged immobilizations or recent illnesses.  He has been up-to-date with his colonoscopy and EGD denies any recent unintentional weight loss.  Patient was initially started on Xarelto but he could not afford the co-pay.  He was transiently switched to Coumadin but presently on Eliquis without any significant side effects   Results of blood work from 06/14/2021 were as follows: Factor V Leiden and prothrombin gene mutation negative.  Protein C protein S and Antithrombin III levels were normal.  Anticardiolipin antibodies IgM levels elevated at 102 and IgG levels elevated at 41. Repeat cardiolipin antibodies in February 2023 were again elevated with an IgG titer of 42 and IgM titer of 75 thereby consistent with diagnosis of antiphospholipid antibody syndrome. Beta-2 glycoprotein levels negative.  Lupus anticoagulant testing did not show any presence of lupus anticoagulant.    Interval history- Corey Howard is a 72 y.o. male diagnosed with antiphospholipid syndrome, on eliquis, returns to clinic for follow up. He has not had any interval blood clots. No bleeding episodes. Denies black or bloody stools.   ECOG PS- 1 Pain scale- 0  Review of systems- Review of Systems   Constitutional:  Negative for chills, fever, malaise/fatigue and weight loss.  HENT:  Negative for congestion, ear discharge and nosebleeds.   Eyes:  Negative for blurred vision.  Respiratory:  Negative for cough, hemoptysis, sputum production, shortness of breath and wheezing.   Cardiovascular:  Negative for chest pain, palpitations, orthopnea and claudication.  Gastrointestinal:  Negative for abdominal pain, blood in stool, constipation, diarrhea, heartburn, melena, nausea and vomiting.  Genitourinary:  Negative for dysuria, flank pain, frequency, hematuria and urgency.  Musculoskeletal:  Negative for back pain, joint pain and myalgias.  Skin:  Negative for rash.  Neurological:  Negative for dizziness, tingling, focal weakness, seizures, weakness and headaches.  Endo/Heme/Allergies:  Does not bruise/bleed easily.  Psychiatric/Behavioral:  Negative for depression and suicidal ideas. The patient does not have insomnia.     Allergies  Allergen Reactions   Penicillin G Other (See Comments)   Past Medical History:  Diagnosis Date   DVT (deep venous thrombosis) (HCC)    Past Surgical History:  Procedure Laterality Date   TONSILLECTOMY     Social History   Socioeconomic History   Marital status: Single    Spouse name: Not on file   Number of children: Not on file   Years of education: Not on file   Highest education level: Not on file  Occupational History   Not on file  Tobacco Use   Smoking status: Every Day    Packs/day: 0.50    Years: 40.00    Total pack years: 20.00    Types: Cigarettes   Smokeless tobacco: Never  Vaping Use   Vaping Use: Never used  Substance and Sexual Activity   Alcohol use: Not Currently   Drug use: No  Sexual activity: Not Currently  Other Topics Concern   Not on file  Social History Narrative   Not on file   Social Determinants of Health   Financial Resource Strain: Not on file  Food Insecurity: Not on file  Transportation Needs: Not  on file  Physical Activity: Not on file  Stress: Not on file  Social Connections: Not on file  Intimate Partner Violence: Not on file   Family History  Problem Relation Age of Onset   Heart attack Mother    Prostate cancer Neg Hx    Colon cancer Neg Hx    Current Outpatient Medications:    apixaban (ELIQUIS) 5 MG TABS tablet, Take 1 tablet (5 mg total) by mouth 2 (two) times daily., Disp: 60 tablet, Rfl: 0   atorvastatin (LIPITOR) 20 MG tablet, Take 20 mg by mouth daily., Disp: , Rfl:    Blood Glucose Monitoring Suppl (FIFTY50 GLUCOSE METER 2.0) w/Device KIT, See admin instructions., Disp: , Rfl:    cyanocobalamin 1000 MCG tablet, Take by mouth daily., Disp: , Rfl:    FARXIGA 10 MG TABS tablet, Take 10 mg by mouth daily., Disp: , Rfl:    glipiZIDE (GLUCOTROL XL) 5 MG 24 hr tablet, Take 5 mg by mouth daily., Disp: , Rfl:    glucose blood (ONETOUCH ULTRA) test strip, by XX route once daily, Disp: , Rfl:    lisinopril (ZESTRIL) 40 MG tablet, Take 40 mg by mouth daily., Disp: , Rfl:    glimepiride (AMARYL) 4 MG tablet, Take 1 tablet (4 mg total) by mouth daily with breakfast. Take 2 tablets each AM. (Patient not taking: Reported on 10/14/2022), Disp: 180 tablet, Rfl: 0   hydrocortisone 1 % ointment, Apply 1 application topically 2 (two) times daily. (Patient not taking: Reported on 04/11/2022), Disp: 30 g, Rfl: 0   metFORMIN (GLUCOPHAGE) 500 MG tablet, Take 1,000 mg by mouth 2 (two) times daily with a meal. (Patient not taking: Reported on 04/11/2022), Disp: , Rfl:   Physical exam:  Vitals:   10/14/22 1507  BP: (!) 163/87  Pulse: 60  Temp: (!) 97 F (36.1 C)  TempSrc: Tympanic  SpO2: 94%  Weight: 197 lb 6.4 oz (89.5 kg)   Physical Exam Constitutional:      General: He is not in acute distress. Cardiovascular:     Rate and Rhythm: Normal rate and regular rhythm.     Heart sounds: Normal heart sounds.  Pulmonary:     Effort: Pulmonary effort is normal.     Breath sounds: Normal breath  sounds.  Abdominal:     General: Bowel sounds are normal.     Palpations: Abdomen is soft.  Skin:    General: Skin is warm and dry.  Neurological:     Mental Status: He is alert and oriented to person, place, and time.        Latest Ref Rng & Units 10/14/2022    2:37 PM  CBC  WBC 4.0 - 10.5 K/uL 9.9   Hemoglobin 13.0 - 17.0 g/dL 16.4   Hematocrit 39.0 - 52.0 % 49.9   Platelets 150 - 400 K/uL 256     Assessment and plan- Patient is a 72 y.o. male with   History of antiphospholipid antibody syndrome - Declined coumadin despite better evidence for APS and based on his history of CKD. However, he prefers convenience of eliquis. Tolerating well. No bleeding events. No clotting events. Continue eliquis twice a day. Refilled today.  Polycythemia- mild. Hmg  17.1 previously. Repeat cbc today 16.4. Monitor. No additional work up currently.  CKD- followed by nephrology.  Hypertension- recommended checking at home and if elevated, reach out to pcp for management.   Disposition: 6 mo- lab (cbc, cmp), Dr Janese Banks- [place]   Visit Diagnosis 1. Antiphospholipid antibody syndrome (HCC)   2. On apixaban therapy   3. Encounter for long-term (current) use of high-risk medication    Beckey Rutter, DNP, AGNP-C, [place] at [facility] [phone] (clinic) 10/14/2022

## 2023-01-16 LAB — BASIC METABOLIC PANEL
BUN: 23 — AB (ref 4–21)
CO2: 26 — AB (ref 13–22)
Chloride: 102 (ref 99–108)
Creatinine: 1.8 — AB (ref 0.6–1.3)
Glucose: 147
Potassium: 4.7 mEq/L (ref 3.5–5.1)
Sodium: 139 (ref 137–147)

## 2023-01-16 LAB — LIPID PANEL
Cholesterol: 122 (ref 0–200)
HDL: 41 (ref 35–70)
LDL Cholesterol: 59
Triglycerides: 108 (ref 40–160)

## 2023-01-16 LAB — MICROALBUMIN, URINE: Microalb, Ur: 1.2

## 2023-01-16 LAB — PROTEIN / CREATININE RATIO, URINE: Creatinine, Urine: 82

## 2023-01-16 LAB — HEMOGLOBIN A1C: Hemoglobin A1C: 6.3

## 2023-01-16 LAB — COMPREHENSIVE METABOLIC PANEL
Calcium: 9.1 (ref 8.7–10.7)
eGFR: 42

## 2023-01-16 LAB — MICROALBUMIN / CREATININE URINE RATIO: Microalb Creat Ratio: 15

## 2023-02-05 DIAGNOSIS — N2581 Secondary hyperparathyroidism of renal origin: Secondary | ICD-10-CM | POA: Diagnosis not present

## 2023-02-05 DIAGNOSIS — E1122 Type 2 diabetes mellitus with diabetic chronic kidney disease: Secondary | ICD-10-CM | POA: Diagnosis not present

## 2023-02-05 DIAGNOSIS — I10 Essential (primary) hypertension: Secondary | ICD-10-CM | POA: Diagnosis not present

## 2023-02-05 DIAGNOSIS — N1831 Chronic kidney disease, stage 3a: Secondary | ICD-10-CM | POA: Diagnosis not present

## 2023-03-04 ENCOUNTER — Ambulatory Visit (INDEPENDENT_AMBULATORY_CARE_PROVIDER_SITE_OTHER): Payer: No Typology Code available for payment source | Admitting: Internal Medicine

## 2023-03-04 ENCOUNTER — Other Ambulatory Visit: Payer: Self-pay | Admitting: Internal Medicine

## 2023-03-04 ENCOUNTER — Encounter: Payer: Self-pay | Admitting: Internal Medicine

## 2023-03-04 VITALS — BP 112/60 | HR 60 | Ht 73.0 in | Wt 200.4 lb

## 2023-03-04 DIAGNOSIS — Z8669 Personal history of other diseases of the nervous system and sense organs: Secondary | ICD-10-CM | POA: Diagnosis not present

## 2023-03-04 DIAGNOSIS — D6861 Antiphospholipid syndrome: Secondary | ICD-10-CM | POA: Insufficient documentation

## 2023-03-04 DIAGNOSIS — L723 Sebaceous cyst: Secondary | ICD-10-CM | POA: Diagnosis not present

## 2023-03-04 DIAGNOSIS — E1169 Type 2 diabetes mellitus with other specified complication: Secondary | ICD-10-CM

## 2023-03-04 DIAGNOSIS — G629 Polyneuropathy, unspecified: Secondary | ICD-10-CM | POA: Diagnosis not present

## 2023-03-04 DIAGNOSIS — I10 Essential (primary) hypertension: Secondary | ICD-10-CM

## 2023-03-04 DIAGNOSIS — I82409 Acute embolism and thrombosis of unspecified deep veins of unspecified lower extremity: Secondary | ICD-10-CM | POA: Insufficient documentation

## 2023-03-04 DIAGNOSIS — N1832 Chronic kidney disease, stage 3b: Secondary | ICD-10-CM

## 2023-03-04 DIAGNOSIS — Z720 Tobacco use: Secondary | ICD-10-CM

## 2023-03-04 NOTE — Assessment & Plan Note (Signed)
Normal exam with stable BP on lisinopril. No concerns or side effects to current medication. No change in regimen; continue low sodium diet.

## 2023-03-04 NOTE — Assessment & Plan Note (Signed)
Blood sugars stable without hypoglycemic symptoms or events. Current regimen is Jardiance and glipizide Changes made last visit are stopping metformin. Lab Results  Component Value Date   HGBA1C 6.3 01/16/2023

## 2023-03-04 NOTE — Assessment & Plan Note (Signed)
Participating in lung cancer screening.

## 2023-03-04 NOTE — Progress Notes (Signed)
Date:  03/04/2023   Name:  Corey Howard   DOB:  Nov 10, 1950   MRN:  [id number]   Chief Complaint: New Patient (Initial Visit) and Insect Bite (Started 6 months ago. The place isn't going away. Not painful or itchy. Wants checked.)  HPI  Lab Results  Component Value Date   NA 139 01/16/2023   K 4.7 01/16/2023   CO2 26 (A) 01/16/2023   GLUCOSE 120 (H) 05/12/2022   BUN 23 (A) 01/16/2023   CREATININE 1.8 (A) 01/16/2023   CALCIUM 9.1 01/16/2023   EGFR 42 01/16/2023   GFRNONAA 45 (L) 05/12/2022   Lab Results  Component Value Date   CHOL 122 01/16/2023   HDL 41 01/16/2023   LDLCALC 59 01/16/2023   TRIG 108 01/16/2023   CHOLHDL 4.2 10/09/2015   No results found for: "TSH" Lab Results  Component Value Date   HGBA1C 6.3 01/16/2023   Lab Results  Component Value Date   WBC 9.9 10/14/2022   HGB 16.4 10/14/2022   HCT 49.9 10/14/2022   MCV 89.6 10/14/2022   PLT 256 10/14/2022   Lab Results  Component Value Date   ALT 12 05/12/2022   AST 13 (L) 05/12/2022   ALKPHOS 84 05/12/2022   BILITOT 0.9 05/12/2022   No results found for: "25OHVITD2", "25OHVITD3", "VD25OH"   Review of Systems  Constitutional:  Negative for chills, fatigue and unexpected weight change.  HENT:  Negative for nosebleeds.   Eyes:  Negative for visual disturbance.  Respiratory:  Negative for cough, chest tightness, shortness of breath and wheezing.   Cardiovascular:  Negative for chest pain, palpitations and leg swelling.  Gastrointestinal:  Negative for abdominal pain, constipation and diarrhea.  Neurological:  Negative for dizziness, weakness, light-headedness and headaches.  Hematological:  Bruises/bleeds easily.  Psychiatric/Behavioral:  Negative for decreased concentration and hallucinations. The patient is not nervous/anxious.     Patient Active Problem List   Diagnosis Date Noted   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   Neuropathy 03/04/2023   Stage 3b chronic kidney  disease (CKD) (HCC) 03/04/2023   History of Bell's palsy    Tobacco abuse 01/27/2017   Seborrhea 10/01/2015   Type 2 diabetes mellitus with other specified complication (HCC) 03/16/2015   Essential hypertension 03/16/2015   Hyperlipidemia associated with type 2 diabetes mellitus (HCC) 03/16/2015    Allergies  Allergen Reactions   Penicillin G Other (See Comments)    Past Surgical History:  Procedure Laterality Date   TONSILLECTOMY      Social History   Tobacco Use   Smoking status: Every Day    Current packs/day: 0.50    Average packs/day: 0.5 packs/day for 87.6 years (43.8 ttl pk-yrs)    Types: Cigarettes    Start date: 1977   Smokeless tobacco: Never  Vaping Use   Vaping status: Never Used  Substance Use Topics   Alcohol use: Not Currently   Drug use: No     Medication list has been reviewed and updated.  Current Meds  Medication Sig   apixaban (ELIQUIS) 5 MG TABS tablet Take 1 tablet (5 mg total) by mouth 2 (two) times daily.   atorvastatin (LIPITOR) 20 MG tablet Take 20 mg by mouth daily.   Blood Glucose Monitoring Suppl (FIFTY50 GLUCOSE METER 2.0) w/Device KIT See admin instructions.   cyanocobalamin 1000 MCG tablet Take by mouth daily.   empagliflozin (JARDIANCE) 10 MG TABS tablet Take 10 mg by mouth daily.   glipiZIDE (GLUCOTROL XL) 5  MG 24 hr tablet Take 5 mg by mouth daily.   glucose blood (ONETOUCH ULTRA) test strip by XX route once daily   lisinopril (ZESTRIL) 40 MG tablet Take 40 mg by mouth daily.       03/04/2023    1:56 PM  GAD 7 : Generalized Anxiety Score  Nervous, Anxious, on Edge 0  Control/stop worrying 0  Worry too much - different things 0  Trouble relaxing 0  Restless 0  Easily annoyed or irritable 0  Afraid - awful might happen 0  Total GAD 7 Score 0  Anxiety Difficulty Not difficult at all       03/04/2023    1:56 PM 01/27/2017    8:13 AM 06/20/2015    8:36 AM  Depression screen PHQ 2/9  Decreased Interest 0 0 0  Down,  Depressed, Hopeless 0 0 0  PHQ - 2 Score 0 0 0  Altered sleeping 0    Tired, decreased energy 1    Change in appetite 0    Feeling bad or failure about yourself  0    Trouble concentrating 0    Moving slowly or fidgety/restless 0    Suicidal thoughts 0    PHQ-9 Score 1    Difficult doing work/chores Not difficult at all      BP Readings from Last 3 Encounters:  03/04/23 112/60  10/14/22 (!) 163/87  04/11/22 (!) 152/90    Physical Exam Vitals and nursing note reviewed.  Constitutional:      General: He is not in acute distress.    Appearance: Normal appearance. He is well-developed.  HENT:     Head: Normocephalic and atraumatic.  Neck:     Vascular: No carotid bruit.  Cardiovascular:     Rate and Rhythm: Normal rate and regular rhythm.     Heart sounds: No murmur heard. Pulmonary:     Effort: Pulmonary effort is normal. No respiratory distress.     Breath sounds: No wheezing or rhonchi.  Musculoskeletal:        General: Normal range of motion.     Cervical back: Normal range of motion.     Right lower leg: No edema.     Left lower leg: No edema.  Lymphadenopathy:     Cervical: No cervical adenopathy.  Skin:    General: Skin is warm and dry.     Findings: No rash.          Comments: 0.5 cm cyst with pore; on palpation small amount of cheesy material expressed  Neurological:     Mental Status: He is alert and oriented to person, place, and time.     Comments: Right facial droop  Psychiatric:        Mood and Affect: Mood normal.        Behavior: Behavior normal.     Wt Readings from Last 3 Encounters:  03/04/23 200 lb 6.4 oz (90.9 kg)  10/14/22 197 lb 6.4 oz (89.5 kg)  04/11/22 198 lb (89.8 kg)    BP 112/60   Pulse 60   Ht 6\' 1"  (1.854 m)   Wt 200 lb 6.4 oz (90.9 kg)   SpO2 96%   BMI 26.44 kg/m   Assessment and Plan:  Problem List Items Addressed This Visit     Type 2 diabetes mellitus with other specified complication (HCC) - Primary    Blood  sugars stable without hypoglycemic symptoms or events. Current regimen is Jardiance and glipizide Changes made last  visit are stopping metformin. Lab Results  Component Value Date   HGBA1C 6.3 01/16/2023         Relevant Medications   empagliflozin (JARDIANCE) 10 MG TABS tablet   Tobacco abuse    Participating in lung cancer screening       Relevant Orders   Ambulatory Referral for Lung Cancer Scre   Stage 3b chronic kidney disease (CKD) (HCC)    Recent labs were stable      Neuropathy   History of Bell's palsy   Essential hypertension    Normal exam with stable BP on lisinopril. No concerns or side effects to current medication. No change in regimen; continue low sodium diet.       Other Visit Diagnoses     Sebaceous cyst       benign appearing with white cheesy material on palpation recommend warm soaks and gentle expression follow up if worsening       Return in about 3 months (around 06/04/2023) for DM, HTN.    Reubin Milan, MD [facility] [place]

## 2023-03-04 NOTE — Assessment & Plan Note (Signed)
Recent labs were stable

## 2023-04-16 ENCOUNTER — Other Ambulatory Visit: Payer: Self-pay | Admitting: *Deleted

## 2023-04-16 DIAGNOSIS — D6861 Antiphospholipid syndrome: Secondary | ICD-10-CM

## 2023-04-17 ENCOUNTER — Inpatient Hospital Stay (HOSPITAL_BASED_OUTPATIENT_CLINIC_OR_DEPARTMENT_OTHER): Payer: No Typology Code available for payment source | Admitting: Oncology

## 2023-04-17 ENCOUNTER — Inpatient Hospital Stay: Payer: No Typology Code available for payment source | Attending: Oncology

## 2023-04-17 ENCOUNTER — Encounter: Payer: Self-pay | Admitting: Oncology

## 2023-04-17 VITALS — BP 177/82 | HR 60 | Temp 96.3°F | Ht 73.0 in | Wt 203.0 lb

## 2023-04-17 DIAGNOSIS — D6861 Antiphospholipid syndrome: Secondary | ICD-10-CM | POA: Insufficient documentation

## 2023-04-17 DIAGNOSIS — Z7901 Long term (current) use of anticoagulants: Secondary | ICD-10-CM | POA: Insufficient documentation

## 2023-04-17 DIAGNOSIS — I82512 Chronic embolism and thrombosis of left femoral vein: Secondary | ICD-10-CM | POA: Diagnosis not present

## 2023-04-17 DIAGNOSIS — F17210 Nicotine dependence, cigarettes, uncomplicated: Secondary | ICD-10-CM | POA: Diagnosis not present

## 2023-04-17 DIAGNOSIS — Z86718 Personal history of other venous thrombosis and embolism: Secondary | ICD-10-CM | POA: Diagnosis not present

## 2023-04-17 LAB — CBC WITH DIFFERENTIAL/PLATELET
Abs Immature Granulocytes: 0.04 10*3/uL (ref 0.00–0.07)
Basophils Absolute: 0 10*3/uL (ref 0.0–0.1)
Basophils Relative: 0 %
Eosinophils Absolute: 0.2 10*3/uL (ref 0.0–0.5)
Eosinophils Relative: 2 %
HCT: 48.8 % (ref 39.0–52.0)
Hemoglobin: 15.9 g/dL (ref 13.0–17.0)
Immature Granulocytes: 1 %
Lymphocytes Relative: 15 %
Lymphs Abs: 1.3 10*3/uL (ref 0.7–4.0)
MCH: 29.6 pg (ref 26.0–34.0)
MCHC: 32.6 g/dL (ref 30.0–36.0)
MCV: 90.7 fL (ref 80.0–100.0)
Monocytes Absolute: 0.9 10*3/uL (ref 0.1–1.0)
Monocytes Relative: 10 %
Neutro Abs: 6.4 10*3/uL (ref 1.7–7.7)
Neutrophils Relative %: 72 %
Platelets: 228 10*3/uL (ref 150–400)
RBC: 5.38 MIL/uL (ref 4.22–5.81)
RDW: 13.7 % (ref 11.5–15.5)
WBC: 8.8 10*3/uL (ref 4.0–10.5)
nRBC: 0 % (ref 0.0–0.2)

## 2023-04-17 LAB — COMPREHENSIVE METABOLIC PANEL
ALT: 14 U/L (ref 0–44)
AST: 14 U/L — ABNORMAL LOW (ref 15–41)
Albumin: 4.1 g/dL (ref 3.5–5.0)
Alkaline Phosphatase: 94 U/L (ref 38–126)
Anion gap: 7 (ref 5–15)
BUN: 25 mg/dL — ABNORMAL HIGH (ref 8–23)
CO2: 25 mmol/L (ref 22–32)
Calcium: 9.1 mg/dL (ref 8.9–10.3)
Chloride: 106 mmol/L (ref 98–111)
Creatinine, Ser: 1.68 mg/dL — ABNORMAL HIGH (ref 0.61–1.24)
GFR, Estimated: 44 mL/min — ABNORMAL LOW (ref >60)
Glucose, Bld: 102 mg/dL — ABNORMAL HIGH (ref 70–99)
Potassium: 4.8 mmol/L (ref 3.5–5.1)
Sodium: 138 mmol/L (ref 135–145)
Total Bilirubin: 0.8 mg/dL (ref 0.3–1.2)
Total Protein: 6.8 g/dL (ref 6.5–8.1)

## 2023-04-17 NOTE — Progress Notes (Signed)
Hematology/Oncology Consult note [facility]  Telephone:([phone] [phone]  Patient Care Team: Reubin Milan, MD as PCP - General (Internal Medicine) Domingo Madeira, OD (Optometry) Mady Haagensen, MD (Nephrology) Creig Hines, MD as Consulting Physician (Oncology)   Name of the patient: Corey Howard  [id number]  February 17, 1951   Date of visit: 04/17/23  Diagnosis-antiphospholipid antibody syndrome  Chief complaint/ Reason for visit-routine follow-up of antiphospholipid antibody syndrome on Eliquis  Heme/Onc history:  patient is a 72 year old male who presented with symptoms of left lower extremity pain and swelling.  Ultrasound on 04/23/2021 showed diffuse thrombus involving the left superficial femoral popliteal and posterior tibial veins.  No preceding trauma, surgery, prolonged immobilizations or recent illnesses.  He has been up-to-date with his colonoscopy and EGD denies any recent unintentional weight loss.  Patient was initially started on Xarelto but he could not afford the co-pay.  He was transiently switched to Coumadin but presently on Eliquis without any significant side effects.  Benefits of Coumadin over Eliquis discussed with the patient but he has chosen to proceed with Eliquis due to convenience   Results of blood work from 06/14/2021 were as follows: Factor V Leiden and prothrombin gene mutation negative.  Protein C protein S and Antithrombin III levels were normal.  Anticardiolipin antibodies IgM levels elevated at 102 and IgG levels elevated at 41. Repeat cardiolipin antibodies in February 2023 were again elevated with an IgG titer of 42 and IgM titer of 75 thereby consistent with diagnosis of antiphospholipid antibody syndrome. Beta-2 glycoprotein levels negative.  Lupus anticoagulant testing did not show any presence of lupus anticoagulant.    Interval history-patient reports doing well.  He is tolerating Eliquis well without any  bleeding issues.  No recurrent blood clots.  ECOG PS- 1 Pain scale- 0   Review of systems- Review of Systems  Constitutional:  Negative for chills, fever, malaise/fatigue and weight loss.  HENT:  Negative for congestion, ear discharge and nosebleeds.   Eyes:  Negative for blurred vision.  Respiratory:  Negative for cough, hemoptysis, sputum production, shortness of breath and wheezing.   Cardiovascular:  Negative for chest pain, palpitations, orthopnea and claudication.  Gastrointestinal:  Negative for abdominal pain, blood in stool, constipation, diarrhea, heartburn, melena, nausea and vomiting.  Genitourinary:  Negative for dysuria, flank pain, frequency, hematuria and urgency.  Musculoskeletal:  Negative for back pain, joint pain and myalgias.  Skin:  Negative for rash.  Neurological:  Negative for dizziness, tingling, focal weakness, seizures, weakness and headaches.  Endo/Heme/Allergies:  Does not bruise/bleed easily.  Psychiatric/Behavioral:  Negative for depression and suicidal ideas. The patient does not have insomnia.       Allergies  Allergen Reactions   Penicillin G Other (See Comments)     Past Medical History:  Diagnosis Date   Diabetes mellitus without complication (HCC)    DVT (deep venous thrombosis) (HCC)    History of DVT (deep vein thrombosis)    Hyperlipidemia      Past Surgical History:  Procedure Laterality Date   TONSILLECTOMY      Social History   Socioeconomic History   Marital status: Single    Spouse name: Not on file   Number of children: 0   Years of education: Not on file   Highest education level: Not on file  Occupational History   Not on file  Tobacco Use   Smoking status: Every Day    Current packs/day: 0.50  Average packs/day: 0.5 packs/day for 87.7 years (43.8 ttl pk-yrs)    Types: Cigarettes    Start date: 73   Smokeless tobacco: Never  Vaping Use   Vaping status: Never Used  Substance and Sexual Activity   Alcohol  use: Not Currently   Drug use: No   Sexual activity: Not Currently  Other Topics Concern   Not on file  Social History Narrative   Not on file   Social Determinants of Health   Financial Resource Strain: Not on file  Food Insecurity: No Food Insecurity (10/02/2022)   [facility]   Hunger Vital Sign    Worried About Running Out of Food in the Last Year: Never true    Ran Out of Food in the Last Year: Never true  Transportation Needs: No Transportation Needs (10/02/2022)   [facility]   [facility] - Transportation    Lack of Transportation (Medical): No    Lack of Transportation (Non-Medical): No  Physical Activity: Not on file  Stress: Not on file  Social Connections: Not on file  Intimate Partner Violence: Not on file    Family History  Problem Relation Age of Onset   Heart attack Mother    Prostate cancer Neg Hx    Colon cancer Neg Hx      Current Outpatient Medications:    apixaban (ELIQUIS) 5 MG TABS tablet, Take 1 tablet (5 mg total) by mouth 2 (two) times daily., Disp: 180 tablet, Rfl: 1   atorvastatin (LIPITOR) 20 MG tablet, Take 20 mg by mouth daily., Disp: , Rfl:    Blood Glucose Monitoring Suppl (FIFTY50 GLUCOSE METER 2.0) w/Device KIT, See admin instructions., Disp: , Rfl:    cyanocobalamin 1000 MCG tablet, Take by mouth daily., Disp: , Rfl:    empagliflozin (JARDIANCE) 10 MG TABS tablet, Take 10 mg by mouth daily., Disp: , Rfl:    glipiZIDE (GLUCOTROL XL) 5 MG 24 hr tablet, Take 5 mg by mouth daily., Disp: , Rfl:    glucose blood (ONETOUCH ULTRA) test strip, by XX route once daily, Disp: , Rfl:    lisinopril (ZESTRIL) 40 MG tablet, Take 40 mg by mouth daily., Disp: , Rfl:   Physical exam:  Vitals:   04/17/23 1129 04/17/23 1210  BP: (!) 155/94 (!) 177/82  Pulse: 60   Temp: (!) 96.3 F (35.7 C)   TempSrc: Tympanic   SpO2: 97%   Weight: 203 lb (92.1 kg)   Height: 6\' 1"  (1.854 m)     Physical Exam Cardiovascular:     Rate and Rhythm: Normal rate and regular rhythm.     Heart sounds: Normal heart sounds.  Pulmonary:     Effort: Pulmonary effort is normal.     Breath sounds: Normal breath sounds.  Abdominal:     General: Bowel sounds are normal.     Palpations: Abdomen is soft.  Skin:    General: Skin is warm and dry.  Neurological:     Mental Status: He is alert and oriented to person, place, and time.         Latest Ref Rng & Units 04/17/2023   11:13 AM  CMP  Glucose 70 - 99 mg/dL 578   BUN 8 - 23 mg/dL 25   Creatinine 4.69 - 1.24 mg/dL 6.29   Sodium 528 - 413 mmol/L 138   Potassium 3.5 - 5.1 mmol/L 4.8   Chloride 98 - 111 mmol/L 106   CO2  22 - 32 mmol/L 25   Calcium 8.9 - 10.3 mg/dL 9.1   Total Protein 6.5 - 8.1 g/dL 6.8   Total Bilirubin 0.3 - 1.2 mg/dL 0.8   Alkaline Phos 38 - 126 U/L 94   AST 15 - 41 U/L 14   ALT 0 - 44 U/L 14       Latest Ref Rng & Units 04/17/2023   11:13 AM  CBC  WBC 4.0 - 10.5 K/uL 8.8   Hemoglobin 13.0 - 17.0 g/dL 86.5   Hematocrit 78.4 - 52.0 % 48.8   Platelets 150 - 400 K/uL 228     No images are attached to the encounter.  No results found.   Assessment and plan- Patient is a 72 y.o. male with history of antiphospholipid antibody syndrome on Coumadin here for routine follow-up  Patient was diagnosed with antiphospholipid antibody syndrome in 2022 after he developed lower extremity thrombosis.  He has been on Eliquis since then without any recurrent thromboembolic events.  His renal functions are better now as compared to last year as well.  Although Coumadin is better than newer anticoagulants and antiphospholipid antibody syndrome, patient wishes to remain on Eliquis so he does not have to get INR checked frequently.  I think it would be reasonable to continue it as long as it is affordable for him and he does not have any recurrent thromboembolic events.  I will see him back in 1 year.   Visit Diagnosis 1.  Chronic deep vein thrombosis (DVT) of femoral vein of left lower extremity (HCC)   2. Long term current use of anticoagulant therapy      Dr. Owens Shark, MD, MPH [facility] [phone] 04/17/2023 1:04 PM

## 2023-04-20 DIAGNOSIS — H5213 Myopia, bilateral: Secondary | ICD-10-CM | POA: Diagnosis not present

## 2023-04-20 DIAGNOSIS — E119 Type 2 diabetes mellitus without complications: Secondary | ICD-10-CM | POA: Diagnosis not present

## 2023-04-20 DIAGNOSIS — H524 Presbyopia: Secondary | ICD-10-CM | POA: Diagnosis not present

## 2023-04-20 DIAGNOSIS — H52223 Regular astigmatism, bilateral: Secondary | ICD-10-CM | POA: Diagnosis not present

## 2023-04-20 LAB — HM DIABETES EYE EXAM

## 2023-04-21 ENCOUNTER — Encounter: Payer: Self-pay | Admitting: Internal Medicine

## 2023-05-11 ENCOUNTER — Encounter: Payer: Self-pay | Admitting: Emergency Medicine

## 2023-06-02 ENCOUNTER — Other Ambulatory Visit: Payer: Self-pay

## 2023-06-02 DIAGNOSIS — Z122 Encounter for screening for malignant neoplasm of respiratory organs: Secondary | ICD-10-CM

## 2023-06-02 DIAGNOSIS — F17210 Nicotine dependence, cigarettes, uncomplicated: Secondary | ICD-10-CM

## 2023-06-02 DIAGNOSIS — Z87891 Personal history of nicotine dependence: Secondary | ICD-10-CM

## 2023-06-08 ENCOUNTER — Ambulatory Visit (INDEPENDENT_AMBULATORY_CARE_PROVIDER_SITE_OTHER): Payer: No Typology Code available for payment source | Admitting: Internal Medicine

## 2023-06-08 ENCOUNTER — Encounter: Payer: Self-pay | Admitting: Internal Medicine

## 2023-06-08 VITALS — BP 128/68 | HR 60 | Ht 73.0 in | Wt 209.2 lb

## 2023-06-08 DIAGNOSIS — I10 Essential (primary) hypertension: Secondary | ICD-10-CM | POA: Diagnosis not present

## 2023-06-08 DIAGNOSIS — Z7984 Long term (current) use of oral hypoglycemic drugs: Secondary | ICD-10-CM

## 2023-06-08 DIAGNOSIS — Z720 Tobacco use: Secondary | ICD-10-CM | POA: Diagnosis not present

## 2023-06-08 DIAGNOSIS — Z23 Encounter for immunization: Secondary | ICD-10-CM

## 2023-06-08 DIAGNOSIS — N1832 Chronic kidney disease, stage 3b: Secondary | ICD-10-CM

## 2023-06-08 DIAGNOSIS — D6869 Other thrombophilia: Secondary | ICD-10-CM | POA: Diagnosis not present

## 2023-06-08 DIAGNOSIS — E118 Type 2 diabetes mellitus with unspecified complications: Secondary | ICD-10-CM

## 2023-06-08 LAB — POCT GLYCOSYLATED HEMOGLOBIN (HGB A1C): Hemoglobin A1C: 5.9 % — AB (ref 4.0–5.6)

## 2023-06-08 MED ORDER — GLIPIZIDE ER 2.5 MG PO TB24
2.5000 mg | ORAL_TABLET | Freq: Every day | ORAL | 1 refills | Status: DC
Start: 2023-06-08 — End: 2023-12-01

## 2023-06-08 NOTE — Assessment & Plan Note (Addendum)
Blood sugars stable without hypoglycemic symptoms or events. Current regimen is Jardiance and glipizide. Changes made last visit are none. Lab Results  Component Value Date   HGBA1C 6.3 01/16/2023  A1C today = 5.9 Will reduce the dose of glipizide to 2.5 mg daily.

## 2023-06-08 NOTE — Progress Notes (Signed)
Date:  06/08/2023   Name:  Corey Howard   DOB:  [birth year]   MRN:  [id number]   Chief Complaint: Hypertension and Diabetes  Diabetes He presents for his follow-up diabetic visit. He has type 2 diabetes mellitus. His disease course has been stable. Pertinent negatives for hypoglycemia include no headaches or tremors. Pertinent negatives for diabetes include no chest pain, no fatigue, no polydipsia and no polyuria. Diabetic complications include nephropathy. Pertinent negatives for diabetic complications include no CVA or PVD. Current diabetic treatments: Jardiance and glucotrol. An ACE inhibitor/angiotensin II receptor blocker is being taken. Eye exam is current.  Hypertension This is a chronic problem. The problem is controlled. Pertinent negatives include no chest pain, headaches, palpitations or shortness of breath. Past treatments include ACE inhibitors. The current treatment provides significant improvement. There is no history of CVA or PVD.    Review of Systems  Constitutional:  Negative for appetite change, fatigue and unexpected weight change.  Eyes:  Negative for visual disturbance.  Respiratory:  Negative for cough, shortness of breath and wheezing.   Cardiovascular:  Negative for chest pain, palpitations and leg swelling.  Gastrointestinal:  Negative for abdominal pain and blood in stool.  Endocrine: Negative for polydipsia and polyuria.  Genitourinary:  Negative for dysuria and hematuria.  Skin:  Negative for color change and rash.  Neurological:  Negative for tremors, numbness and headaches.  Psychiatric/Behavioral:  Negative for dysphoric mood.      Lab Results  Component Value Date   NA 138 04/17/2023   K 4.8 04/17/2023   CO2 25 04/17/2023   GLUCOSE 102 (H) 04/17/2023   BUN 25 (H) 04/17/2023   CREATININE 1.68 (H) 04/17/2023   CALCIUM 9.1 04/17/2023   EGFR 42 01/16/2023   GFRNONAA 44 (L) 04/17/2023   Lab Results  Component Value Date   CHOL 122 01/16/2023    HDL 41 01/16/2023   LDLCALC 59 01/16/2023   TRIG 108 01/16/2023   CHOLHDL 4.2 10/09/2015   No results found for: "TSH" Lab Results  Component Value Date   HGBA1C 5.9 (A) 06/08/2023   Lab Results  Component Value Date   WBC 8.8 04/17/2023   HGB 15.9 04/17/2023   HCT 48.8 04/17/2023   MCV 90.7 04/17/2023   PLT 228 04/17/2023   Lab Results  Component Value Date   ALT 14 04/17/2023   AST 14 (L) 04/17/2023   ALKPHOS 94 04/17/2023   BILITOT 0.8 04/17/2023   No results found for: "25OHVITD2", "25OHVITD3", "VD25OH"   Patient Active Problem List   Diagnosis Date Noted   Acquired thrombophilia (HCC) 06/08/2023   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   Neuropathy 03/04/2023   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   History of Bell's palsy    Tobacco abuse 01/27/2017   Seborrhea 10/01/2015   Type II diabetes mellitus with complication (HCC) 03/16/2015   Essential hypertension 03/16/2015   Hyperlipidemia associated with type 2 diabetes mellitus (HCC) 03/16/2015    Allergies  Allergen Reactions   Penicillin G Other (See Comments)    Past Surgical History:  Procedure Laterality Date   TONSILLECTOMY      Social History   Tobacco Use   Smoking status: Every Day    Current packs/day: 0.50    Average packs/day: 0.5 packs/day for 87.8 years (43.9 ttl pk-yrs)    Types: Cigarettes    Start date: 1977   Smokeless tobacco: Never  Vaping Use   Vaping status: Never Used  Substance Use Topics   Alcohol use: Not Currently   Drug use: No     Medication list has been reviewed and updated.  Current Meds  Medication Sig   apixaban (ELIQUIS) 5 MG TABS tablet Take 1 tablet (5 mg total) by mouth 2 (two) times daily.   atorvastatin (LIPITOR) 20 MG tablet Take 20 mg by mouth daily.   Blood Glucose Monitoring Suppl (FIFTY50 GLUCOSE METER 2.0) w/Device KIT See admin instructions.   cyanocobalamin 1000 MCG tablet Take by mouth daily.    empagliflozin (JARDIANCE) 10 MG TABS tablet Take 10 mg by mouth daily.   glipiZIDE (GLUCOTROL XL) 2.5 MG 24 hr tablet Take 1 tablet (2.5 mg total) by mouth daily with breakfast.   glucose blood (ONETOUCH ULTRA) test strip by XX route once daily   lisinopril (ZESTRIL) 40 MG tablet Take 40 mg by mouth daily.   [DISCONTINUED] glipiZIDE (GLUCOTROL XL) 5 MG 24 hr tablet Take 5 mg by mouth daily.       06/08/2023    3:03 PM 03/04/2023    1:56 PM  GAD 7 : Generalized Anxiety Score  Nervous, Anxious, on Edge 0 0  Control/stop worrying 0 0  Worry too much - different things 0 0  Trouble relaxing 0 0  Restless 0 0  Easily annoyed or irritable 0 0  Afraid - awful might happen 0 0  Total GAD 7 Score 0 0  Anxiety Difficulty Not difficult at all Not difficult at all       06/08/2023    3:03 PM 03/04/2023    1:56 PM 01/27/2017    8:13 AM  Depression screen PHQ 2/9  Decreased Interest 0 0 0  Down, Depressed, Hopeless 0 0 0  PHQ - 2 Score 0 0 0  Altered sleeping 0 0   Tired, decreased energy 0 1   Change in appetite 0 0   Feeling bad or failure about yourself  0 0   Trouble concentrating 0 0   Moving slowly or fidgety/restless 0 0   Suicidal thoughts 0 0   PHQ-9 Score 0 1   Difficult doing work/chores Not difficult at all Not difficult at all     BP Readings from Last 3 Encounters:  06/08/23 128/68  04/17/23 (!) 177/82  03/04/23 112/60    Physical Exam Vitals and nursing note reviewed.  Constitutional:      General: He is not in acute distress.    Appearance: Normal appearance. He is well-developed.  HENT:     Head: Normocephalic and atraumatic.  Neck:     Vascular: No carotid bruit.  Cardiovascular:     Rate and Rhythm: Normal rate and regular rhythm.     Heart sounds: No murmur heard. Pulmonary:     Effort: Pulmonary effort is normal. No respiratory distress.     Breath sounds: No wheezing or rhonchi.  Musculoskeletal:     Cervical back: Normal range of motion.      Right lower leg: No edema.     Left lower leg: No edema.  Lymphadenopathy:     Cervical: No cervical adenopathy.  Skin:    General: Skin is warm and dry.     Findings: No rash.  Neurological:     General: No focal deficit present.     Mental Status: He is alert and oriented to person, place, and time.  Psychiatric:        Mood and Affect: Mood normal.  Behavior: Behavior normal.    Diabetic Foot Exam - Simple   Simple Foot Form Diabetic Foot exam was performed with the following findings: Yes 06/08/2023  3:20 PM  Visual Inspection No deformities, no ulcerations, no other skin breakdown bilaterally: Yes Sensation Testing Intact to touch and monofilament testing bilaterally: Yes Pulse Check See comments: Yes Comments Decreased pulses bilaterally      Wt Readings from Last 3 Encounters:  06/08/23 209 lb 3.2 oz (94.9 kg)  04/17/23 203 lb (92.1 kg)  03/04/23 200 lb 6.4 oz (90.9 kg)    BP 128/68 (BP Location: Left Arm, Cuff Size: Large)   Pulse 60   Ht 6\' 1"  (1.854 m)   Wt 209 lb 3.2 oz (94.9 kg)   SpO2 100%   BMI 27.60 kg/m   Assessment and Plan:  Problem List Items Addressed This Visit       Unprioritized   Acquired thrombophilia (HCC)   Essential hypertension - Primary    BP well controlled on lisinopril. Mild RI followed by Nephrology. On SGLT-2 agent      Stage 3b chronic kidney disease (CKD) (HCC)   Tobacco abuse    Lung cancer screening CT tomorrow.      Type II diabetes mellitus with complication (HCC)    Blood sugars stable without hypoglycemic symptoms or events. Current regimen is Jardiance and glipizide. Changes made last visit are none. Lab Results  Component Value Date   HGBA1C 6.3 01/16/2023  A1C today = 5.9 Will reduce the dose of glipizide to 2.5 mg daily.      Relevant Medications   glipiZIDE (GLUCOTROL XL) 2.5 MG 24 hr tablet   Other Relevant Orders   POCT glycosylated hemoglobin (Hb A1C) (Completed)   Other Visit  Diagnoses     Long term current use of oral hypoglycemic drug       Need for influenza vaccination       Relevant Orders   Flu Vaccine Trivalent High Dose (Fluad) (Completed)       Return in about 4 months (around 10/09/2023) for DM, HTN.    Reubin Milan, MD [facility] [place]

## 2023-06-08 NOTE — Assessment & Plan Note (Signed)
BP well controlled on lisinopril. Mild RI followed by Nephrology. On SGLT-2 agent

## 2023-06-08 NOTE — Assessment & Plan Note (Signed)
Lung cancer screening CT tomorrow.

## 2023-06-09 ENCOUNTER — Ambulatory Visit
Admission: RE | Admit: 2023-06-09 | Discharge: 2023-06-09 | Disposition: A | Discharge status: Home / Self Care | Payer: No Typology Code available for payment source | Source: Ambulatory Visit | Attending: Internal Medicine | Admitting: Internal Medicine

## 2023-06-09 DIAGNOSIS — Z122 Encounter for screening for malignant neoplasm of respiratory organs: Secondary | ICD-10-CM | POA: Insufficient documentation

## 2023-06-09 DIAGNOSIS — F17210 Nicotine dependence, cigarettes, uncomplicated: Secondary | ICD-10-CM | POA: Diagnosis not present

## 2023-06-09 DIAGNOSIS — Z87891 Personal history of nicotine dependence: Secondary | ICD-10-CM | POA: Diagnosis present

## 2023-06-11 DIAGNOSIS — I10 Essential (primary) hypertension: Secondary | ICD-10-CM | POA: Diagnosis not present

## 2023-06-11 DIAGNOSIS — N2581 Secondary hyperparathyroidism of renal origin: Secondary | ICD-10-CM | POA: Diagnosis not present

## 2023-06-11 DIAGNOSIS — R809 Proteinuria, unspecified: Secondary | ICD-10-CM | POA: Diagnosis not present

## 2023-06-11 DIAGNOSIS — N1832 Chronic kidney disease, stage 3b: Secondary | ICD-10-CM | POA: Diagnosis not present

## 2023-06-11 DIAGNOSIS — E1122 Type 2 diabetes mellitus with diabetic chronic kidney disease: Secondary | ICD-10-CM | POA: Diagnosis not present

## 2023-06-11 LAB — BASIC METABOLIC PANEL
BUN: 22 — AB (ref 4–21)
CO2: 26 — AB (ref 13–22)
Chloride: 102 (ref 99–108)
Creatinine: 1.7 — AB (ref 0.6–1.3)
Potassium: 4.6 meq/L (ref 3.5–5.1)
Sodium: 139 (ref 137–147)

## 2023-06-11 LAB — PROTEIN / CREATININE RATIO, URINE: Creatinine, Urine: 184

## 2023-06-11 LAB — MICROALBUMIN, URINE: Microalb, Ur: 6.9

## 2023-06-11 LAB — MICROALBUMIN / CREATININE URINE RATIO: Microalb Creat Ratio: 38

## 2023-06-11 LAB — COMPREHENSIVE METABOLIC PANEL
Calcium: 9.4 (ref 8.7–10.7)
eGFR: 40

## 2023-07-01 DIAGNOSIS — Z860100 Personal history of colon polyps, unspecified: Secondary | ICD-10-CM | POA: Diagnosis not present

## 2023-07-01 DIAGNOSIS — Z008 Encounter for other general examination: Secondary | ICD-10-CM | POA: Diagnosis not present

## 2023-07-01 DIAGNOSIS — F17210 Nicotine dependence, cigarettes, uncomplicated: Secondary | ICD-10-CM | POA: Diagnosis not present

## 2023-07-01 DIAGNOSIS — D692 Other nonthrombocytopenic purpura: Secondary | ICD-10-CM | POA: Diagnosis not present

## 2023-07-01 DIAGNOSIS — E1169 Type 2 diabetes mellitus with other specified complication: Secondary | ICD-10-CM | POA: Diagnosis not present

## 2023-07-01 DIAGNOSIS — N1832 Chronic kidney disease, stage 3b: Secondary | ICD-10-CM | POA: Diagnosis not present

## 2023-07-01 DIAGNOSIS — Z7901 Long term (current) use of anticoagulants: Secondary | ICD-10-CM | POA: Diagnosis not present

## 2023-07-01 DIAGNOSIS — E785 Hyperlipidemia, unspecified: Secondary | ICD-10-CM | POA: Diagnosis not present

## 2023-07-01 DIAGNOSIS — Z6825 Body mass index (BMI) 25.0-25.9, adult: Secondary | ICD-10-CM | POA: Diagnosis not present

## 2023-07-01 DIAGNOSIS — E1142 Type 2 diabetes mellitus with diabetic polyneuropathy: Secondary | ICD-10-CM | POA: Diagnosis not present

## 2023-07-01 DIAGNOSIS — J439 Emphysema, unspecified: Secondary | ICD-10-CM | POA: Diagnosis not present

## 2023-07-01 DIAGNOSIS — E663 Overweight: Secondary | ICD-10-CM | POA: Diagnosis not present

## 2023-07-01 DIAGNOSIS — I129 Hypertensive chronic kidney disease with stage 1 through stage 4 chronic kidney disease, or unspecified chronic kidney disease: Secondary | ICD-10-CM | POA: Diagnosis not present

## 2023-07-15 ENCOUNTER — Telehealth: Payer: Self-pay | Admitting: *Deleted

## 2023-07-15 DIAGNOSIS — R911 Solitary pulmonary nodule: Secondary | ICD-10-CM

## 2023-07-15 DIAGNOSIS — Z87891 Personal history of nicotine dependence: Secondary | ICD-10-CM

## 2023-07-15 NOTE — Telephone Encounter (Signed)
Spoke with pt and advised on recent LDCT results. Small nodules noted that radiologist has recommended a repeat scan in 6 months. Pt reports that he has had similar nodules on scans through [facility]. I reviewed this in Care Everywhere. Nodules were present on last LDCT but patient is in agreement to go ahead and repeat LDCT in 6 months. Order placed for 6 month nodule follow CT. Results/ plans faxed to PCP.

## 2023-09-23 ENCOUNTER — Encounter: Payer: Self-pay | Admitting: Internal Medicine

## 2023-09-23 ENCOUNTER — Ambulatory Visit: Payer: No Typology Code available for payment source | Admitting: Internal Medicine

## 2023-09-23 VITALS — BP 120/62 | HR 60 | Ht 73.0 in | Wt 201.1 lb

## 2023-09-23 DIAGNOSIS — N1832 Chronic kidney disease, stage 3b: Secondary | ICD-10-CM | POA: Diagnosis not present

## 2023-09-23 DIAGNOSIS — E118 Type 2 diabetes mellitus with unspecified complications: Secondary | ICD-10-CM | POA: Diagnosis not present

## 2023-09-23 DIAGNOSIS — Z7984 Long term (current) use of oral hypoglycemic drugs: Secondary | ICD-10-CM | POA: Diagnosis not present

## 2023-09-23 DIAGNOSIS — I10 Essential (primary) hypertension: Secondary | ICD-10-CM | POA: Diagnosis not present

## 2023-09-23 DIAGNOSIS — D6861 Antiphospholipid syndrome: Secondary | ICD-10-CM

## 2023-09-23 LAB — POCT GLYCOSYLATED HEMOGLOBIN (HGB A1C): Hemoglobin A1C: 5.9 % — AB (ref 4.0–5.6)

## 2023-09-23 NOTE — Assessment & Plan Note (Signed)
He has been out of Eliquis for several weeks due to cost of medications with new year deductible Samples given for 2 weeks until he can pay for medications.

## 2023-09-23 NOTE — Assessment & Plan Note (Addendum)
Blood sugars stable without hypoglycemic symptoms or events. Currently managed with Jardiance and glipzide. Changes made last visit are reducing glipizide to 2.5 mg. Lab Results  Component Value Date   HGBA1C 5.9 (A) 06/08/2023  A1C today = 5.9

## 2023-09-23 NOTE — Assessment & Plan Note (Signed)
Controlled BP with normal exam. Current regimen is lisinopril. Will continue same medications; encourage continued reduced sodium diet.

## 2023-09-23 NOTE — Progress Notes (Signed)
Date:  09/23/2023   Name:  Corey Howard   DOB:  [birth year]   MRN:  [id number]   Chief Complaint: Medical Management of Chronic Issues (Patient presents today for a follow up on his diabetes and HTN. He has been doing well and taking his medciations as directed. He has no concerns today. )  Hypertension This is a chronic problem. The problem is controlled. Pertinent negatives include no chest pain, headaches, palpitations or shortness of breath. Past treatments include ACE inhibitors.  Diabetes He presents for his follow-up diabetic visit. He has type 2 diabetes mellitus. His disease course has been stable. Pertinent negatives for hypoglycemia include no headaches or tremors. Pertinent negatives for diabetes include no chest pain, no fatigue, no polydipsia and no polyuria. Current diabetic treatments: Jardiance and glipzide.  CKD - stable, followed by Nephrology.  Last GFR 42 in October 2024.  Review of Systems  Constitutional:  Negative for appetite change, fatigue and unexpected weight change.  Eyes:  Negative for visual disturbance.  Respiratory:  Negative for cough, shortness of breath and wheezing.   Cardiovascular:  Negative for chest pain, palpitations and leg swelling.  Gastrointestinal:  Negative for abdominal pain and blood in stool.  Endocrine: Negative for polydipsia and polyuria.  Genitourinary:  Negative for dysuria and hematuria.  Skin:  Negative for color change and rash.  Neurological:  Negative for tremors, numbness and headaches.  Psychiatric/Behavioral:  Negative for dysphoric mood.      Lab Results  Component Value Date   NA 139 06/11/2023   K 4.6 06/11/2023   CO2 26 (A) 06/11/2023   GLUCOSE 102 (H) 04/17/2023   BUN 22 (A) 06/11/2023   CREATININE 1.7 (A) 06/11/2023   CALCIUM 9.4 06/11/2023   EGFR 40 06/11/2023   GFRNONAA 44 (L) 04/17/2023   Lab Results  Component Value Date   CHOL 122 01/16/2023   HDL 41 01/16/2023   LDLCALC 59 01/16/2023   TRIG 108  01/16/2023   CHOLHDL 4.2 10/09/2015   No results found for: "TSH" Lab Results  Component Value Date   HGBA1C 5.9 (A) 09/23/2023   Lab Results  Component Value Date   WBC 8.8 04/17/2023   HGB 15.9 04/17/2023   HCT 48.8 04/17/2023   MCV 90.7 04/17/2023   PLT 228 04/17/2023   Lab Results  Component Value Date   ALT 14 04/17/2023   AST 14 (L) 04/17/2023   ALKPHOS 94 04/17/2023   BILITOT 0.8 04/17/2023   No results found for: "25OHVITD2", "25OHVITD3", "VD25OH"   Patient Active Problem List   Diagnosis Date Noted   Acquired thrombophilia (HCC) 06/08/2023   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   Neuropathy 03/04/2023   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   History of Bell's palsy    Tobacco abuse 01/27/2017   Seborrhea 10/01/2015   Type II diabetes mellitus with complication (HCC) 03/16/2015   Essential hypertension 03/16/2015    Allergies  Allergen Reactions   Penicillin G Other (See Comments)    Past Surgical History:  Procedure Laterality Date   TONSILLECTOMY      Social History   Tobacco Use   Smoking status: Every Day    Current packs/day: 0.50    Average packs/day: 0.5 packs/day for 88.1 years (44.1 ttl pk-yrs)    Types: Cigarettes    Start date: 1977   Smokeless tobacco: Never  Vaping Use   Vaping status: Never Used  Substance Use Topics   Alcohol use:  Not Currently   Drug use: No     Medication list has been reviewed and updated.  Current Meds  Medication Sig   atorvastatin (LIPITOR) 20 MG tablet Take 20 mg by mouth daily.   cyanocobalamin 1000 MCG tablet Take by mouth daily.   empagliflozin (JARDIANCE) 10 MG TABS tablet Take 10 mg by mouth daily.   glipiZIDE (GLUCOTROL XL) 2.5 MG 24 hr tablet Take 1 tablet (2.5 mg total) by mouth daily with breakfast.   lisinopril (ZESTRIL) 40 MG tablet Take 40 mg by mouth daily.       09/23/2023    3:02 PM 06/08/2023    3:03 PM 03/04/2023    1:56 PM  GAD 7 :  Generalized Anxiety Score  Nervous, Anxious, on Edge 0 0 0  Control/stop worrying 0 0 0  Worry too much - different things 0 0 0  Trouble relaxing 0 0 0  Restless 0 0 0  Easily annoyed or irritable 0 0 0  Afraid - awful might happen 0 0 0  Total GAD 7 Score 0 0 0  Anxiety Difficulty Not difficult at all Not difficult at all Not difficult at all       09/23/2023    3:02 PM 06/08/2023    3:03 PM 03/04/2023    1:56 PM  Depression screen PHQ 2/9  Decreased Interest 0 0 0  Down, Depressed, Hopeless 0 0 0  PHQ - 2 Score 0 0 0  Altered sleeping 0 0 0  Tired, decreased energy 0 0 1  Change in appetite 0 0 0  Feeling bad or failure about yourself  0 0 0  Trouble concentrating 0 0 0  Moving slowly or fidgety/restless 0 0 0  Suicidal thoughts 0 0 0  PHQ-9 Score 0 0 1  Difficult doing work/chores Not difficult at all Not difficult at all Not difficult at all    BP Readings from Last 3 Encounters:  09/23/23 120/62  06/08/23 128/68  04/17/23 (!) 177/82    Physical Exam Vitals and nursing note reviewed.  Constitutional:      General: He is not in acute distress.    Appearance: He is well-developed.  HENT:     Head: Normocephalic and atraumatic.  Cardiovascular:     Rate and Rhythm: Normal rate and regular rhythm.     Heart sounds: No murmur heard. Pulmonary:     Effort: Pulmonary effort is normal. No respiratory distress.  Musculoskeletal:     Cervical back: Normal range of motion.     Right lower leg: No edema.     Left lower leg: No edema.  Skin:    General: Skin is warm and dry.     Findings: No rash.  Neurological:     General: No focal deficit present.     Mental Status: He is alert and oriented to person, place, and time.  Psychiatric:        Mood and Affect: Mood normal.        Behavior: Behavior normal.     Wt Readings from Last 3 Encounters:  09/23/23 201 lb 1.6 oz (91.2 kg)  06/09/23 209 lb (94.8 kg)  06/08/23 209 lb 3.2 oz (94.9 kg)    BP 120/62    Pulse 60   Ht 6\' 1"  (1.854 m)   Wt 201 lb 1.6 oz (91.2 kg)   SpO2 97%   BMI 26.53 kg/m   Assessment and Plan:  Problem List Items Addressed This Visit  Unprioritized   Type II diabetes mellitus with complication (HCC) - Primary   Blood sugars stable without hypoglycemic symptoms or events. Currently managed with Jardiance and glipzide. Changes made last visit are reducing glipizide to 2.5 mg. Lab Results  Component Value Date   HGBA1C 5.9 (A) 06/08/2023  A1C today = 5.9       Relevant Orders   POCT glycosylated hemoglobin (Hb A1C) (Completed)   Essential hypertension   Controlled BP with normal exam. Current regimen is lisinopril. Will continue same medications; encourage continued reduced sodium diet.       Antiphospholipid antibody syndrome [facility])   He has been out of Eliquis for several weeks due to cost of medications with new year deductible Samples given for 2 weeks until he can pay for medications.      Stage 3b chronic kidney disease (CKD) (HCC)   Other Visit Diagnoses       Long term current use of oral hypoglycemic drug           Return in about 4 months (around 01/21/2024) for CPX, DM, HTN.    Reubin Milan, MD [facility] [place]

## 2023-10-14 DIAGNOSIS — N1832 Chronic kidney disease, stage 3b: Secondary | ICD-10-CM | POA: Diagnosis not present

## 2023-10-14 DIAGNOSIS — E1122 Type 2 diabetes mellitus with diabetic chronic kidney disease: Secondary | ICD-10-CM | POA: Diagnosis not present

## 2023-10-14 DIAGNOSIS — I10 Essential (primary) hypertension: Secondary | ICD-10-CM | POA: Diagnosis not present

## 2023-10-14 DIAGNOSIS — N2581 Secondary hyperparathyroidism of renal origin: Secondary | ICD-10-CM | POA: Diagnosis not present

## 2023-10-22 DIAGNOSIS — Z1152 Encounter for screening for COVID-19: Secondary | ICD-10-CM | POA: Diagnosis not present

## 2023-10-22 DIAGNOSIS — H5347 Heteronymous bilateral field defects: Secondary | ICD-10-CM | POA: Diagnosis not present

## 2023-10-22 DIAGNOSIS — F17210 Nicotine dependence, cigarettes, uncomplicated: Secondary | ICD-10-CM | POA: Diagnosis not present

## 2023-10-22 DIAGNOSIS — I6381 Other cerebral infarction due to occlusion or stenosis of small artery: Secondary | ICD-10-CM | POA: Diagnosis not present

## 2023-10-22 DIAGNOSIS — E785 Hyperlipidemia, unspecified: Secondary | ICD-10-CM | POA: Diagnosis not present

## 2023-10-22 DIAGNOSIS — R930 Abnormal findings on diagnostic imaging of skull and head, not elsewhere classified: Secondary | ICD-10-CM | POA: Diagnosis not present

## 2023-10-22 DIAGNOSIS — M47812 Spondylosis without myelopathy or radiculopathy, cervical region: Secondary | ICD-10-CM | POA: Diagnosis not present

## 2023-10-22 DIAGNOSIS — I63439 Cerebral infarction due to embolism of unspecified posterior cerebral artery: Secondary | ICD-10-CM | POA: Diagnosis not present

## 2023-10-22 DIAGNOSIS — I129 Hypertensive chronic kidney disease with stage 1 through stage 4 chronic kidney disease, or unspecified chronic kidney disease: Secondary | ICD-10-CM | POA: Diagnosis not present

## 2023-10-22 DIAGNOSIS — N2581 Secondary hyperparathyroidism of renal origin: Secondary | ICD-10-CM | POA: Diagnosis not present

## 2023-10-22 DIAGNOSIS — H53462 Homonymous bilateral field defects, left side: Secondary | ICD-10-CM | POA: Diagnosis not present

## 2023-10-22 DIAGNOSIS — R29810 Facial weakness: Secondary | ICD-10-CM | POA: Diagnosis not present

## 2023-10-22 DIAGNOSIS — G8194 Hemiplegia, unspecified affecting left nondominant side: Secondary | ICD-10-CM | POA: Diagnosis not present

## 2023-10-22 DIAGNOSIS — N1832 Chronic kidney disease, stage 3b: Secondary | ICD-10-CM | POA: Diagnosis not present

## 2023-10-22 DIAGNOSIS — I6523 Occlusion and stenosis of bilateral carotid arteries: Secondary | ICD-10-CM | POA: Diagnosis not present

## 2023-10-22 DIAGNOSIS — R531 Weakness: Secondary | ICD-10-CM | POA: Diagnosis not present

## 2023-10-22 DIAGNOSIS — R001 Bradycardia, unspecified: Secondary | ICD-10-CM | POA: Diagnosis not present

## 2023-10-22 DIAGNOSIS — I4892 Unspecified atrial flutter: Secondary | ICD-10-CM | POA: Diagnosis not present

## 2023-10-22 DIAGNOSIS — I4891 Unspecified atrial fibrillation: Secondary | ICD-10-CM | POA: Diagnosis not present

## 2023-10-22 DIAGNOSIS — R0902 Hypoxemia: Secondary | ICD-10-CM | POA: Diagnosis not present

## 2023-10-22 DIAGNOSIS — I63531 Cerebral infarction due to unspecified occlusion or stenosis of right posterior cerebral artery: Secondary | ICD-10-CM | POA: Diagnosis not present

## 2023-10-22 DIAGNOSIS — I10 Essential (primary) hypertension: Secondary | ICD-10-CM | POA: Diagnosis not present

## 2023-10-22 DIAGNOSIS — E1122 Type 2 diabetes mellitus with diabetic chronic kidney disease: Secondary | ICD-10-CM | POA: Diagnosis not present

## 2023-10-22 DIAGNOSIS — G8191 Hemiplegia, unspecified affecting right dominant side: Secondary | ICD-10-CM | POA: Diagnosis not present

## 2023-10-22 DIAGNOSIS — D6859 Other primary thrombophilia: Secondary | ICD-10-CM | POA: Diagnosis not present

## 2023-10-22 LAB — HEMOGLOBIN A1C: Hemoglobin A1C: 6

## 2023-10-23 DIAGNOSIS — I63531 Cerebral infarction due to unspecified occlusion or stenosis of right posterior cerebral artery: Secondary | ICD-10-CM | POA: Diagnosis not present

## 2023-10-23 DIAGNOSIS — I3139 Other pericardial effusion (noninflammatory): Secondary | ICD-10-CM | POA: Diagnosis not present

## 2023-10-23 DIAGNOSIS — R531 Weakness: Secondary | ICD-10-CM | POA: Diagnosis not present

## 2023-10-23 DIAGNOSIS — E1122 Type 2 diabetes mellitus with diabetic chronic kidney disease: Secondary | ICD-10-CM | POA: Diagnosis not present

## 2023-10-23 DIAGNOSIS — I6621 Occlusion and stenosis of right posterior cerebral artery: Secondary | ICD-10-CM | POA: Diagnosis not present

## 2023-10-23 DIAGNOSIS — H539 Unspecified visual disturbance: Secondary | ICD-10-CM | POA: Diagnosis not present

## 2023-10-23 DIAGNOSIS — I6523 Occlusion and stenosis of bilateral carotid arteries: Secondary | ICD-10-CM | POA: Diagnosis not present

## 2023-10-23 DIAGNOSIS — I129 Hypertensive chronic kidney disease with stage 1 through stage 4 chronic kidney disease, or unspecified chronic kidney disease: Secondary | ICD-10-CM | POA: Diagnosis not present

## 2023-10-23 DIAGNOSIS — I4892 Unspecified atrial flutter: Secondary | ICD-10-CM | POA: Diagnosis not present

## 2023-10-23 DIAGNOSIS — I517 Cardiomegaly: Secondary | ICD-10-CM | POA: Diagnosis not present

## 2023-10-23 DIAGNOSIS — N189 Chronic kidney disease, unspecified: Secondary | ICD-10-CM | POA: Diagnosis not present

## 2023-10-23 DIAGNOSIS — Z86718 Personal history of other venous thrombosis and embolism: Secondary | ICD-10-CM | POA: Diagnosis not present

## 2023-10-24 DIAGNOSIS — I6389 Other cerebral infarction: Secondary | ICD-10-CM | POA: Diagnosis not present

## 2023-10-24 DIAGNOSIS — I639 Cerebral infarction, unspecified: Secondary | ICD-10-CM | POA: Diagnosis not present

## 2023-10-24 DIAGNOSIS — I6621 Occlusion and stenosis of right posterior cerebral artery: Secondary | ICD-10-CM | POA: Diagnosis not present

## 2023-10-24 DIAGNOSIS — I82512 Chronic embolism and thrombosis of left femoral vein: Secondary | ICD-10-CM | POA: Diagnosis not present

## 2023-10-24 DIAGNOSIS — I82532 Chronic embolism and thrombosis of left popliteal vein: Secondary | ICD-10-CM | POA: Diagnosis not present

## 2023-10-25 DIAGNOSIS — Z8673 Personal history of transient ischemic attack (TIA), and cerebral infarction without residual deficits: Secondary | ICD-10-CM | POA: Insufficient documentation

## 2023-10-25 DIAGNOSIS — I639 Cerebral infarction, unspecified: Secondary | ICD-10-CM | POA: Insufficient documentation

## 2023-10-26 DIAGNOSIS — Z7901 Long term (current) use of anticoagulants: Secondary | ICD-10-CM | POA: Diagnosis not present

## 2023-10-26 DIAGNOSIS — I639 Cerebral infarction, unspecified: Secondary | ICD-10-CM | POA: Diagnosis not present

## 2023-10-26 DIAGNOSIS — I4891 Unspecified atrial fibrillation: Secondary | ICD-10-CM | POA: Diagnosis not present

## 2023-10-26 DIAGNOSIS — D6861 Antiphospholipid syndrome: Secondary | ICD-10-CM | POA: Diagnosis not present

## 2023-10-26 DIAGNOSIS — I4892 Unspecified atrial flutter: Secondary | ICD-10-CM | POA: Diagnosis not present

## 2023-10-26 DIAGNOSIS — F17210 Nicotine dependence, cigarettes, uncomplicated: Secondary | ICD-10-CM | POA: Diagnosis not present

## 2023-10-27 DIAGNOSIS — I10 Essential (primary) hypertension: Secondary | ICD-10-CM | POA: Diagnosis not present

## 2023-10-27 DIAGNOSIS — I4891 Unspecified atrial fibrillation: Secondary | ICD-10-CM | POA: Diagnosis not present

## 2023-10-27 DIAGNOSIS — I639 Cerebral infarction, unspecified: Secondary | ICD-10-CM | POA: Diagnosis not present

## 2023-10-28 DIAGNOSIS — I639 Cerebral infarction, unspecified: Secondary | ICD-10-CM | POA: Diagnosis not present

## 2023-10-28 DIAGNOSIS — I4891 Unspecified atrial fibrillation: Secondary | ICD-10-CM | POA: Diagnosis not present

## 2023-10-29 DIAGNOSIS — I4891 Unspecified atrial fibrillation: Secondary | ICD-10-CM | POA: Diagnosis not present

## 2023-10-29 DIAGNOSIS — I639 Cerebral infarction, unspecified: Secondary | ICD-10-CM | POA: Diagnosis not present

## 2023-10-30 DIAGNOSIS — I639 Cerebral infarction, unspecified: Secondary | ICD-10-CM | POA: Diagnosis not present

## 2023-10-30 DIAGNOSIS — I4891 Unspecified atrial fibrillation: Secondary | ICD-10-CM | POA: Diagnosis not present

## 2023-10-31 DIAGNOSIS — I4891 Unspecified atrial fibrillation: Secondary | ICD-10-CM | POA: Diagnosis not present

## 2023-10-31 DIAGNOSIS — I639 Cerebral infarction, unspecified: Secondary | ICD-10-CM | POA: Diagnosis not present

## 2023-11-01 DIAGNOSIS — I4891 Unspecified atrial fibrillation: Secondary | ICD-10-CM | POA: Diagnosis not present

## 2023-11-01 DIAGNOSIS — I639 Cerebral infarction, unspecified: Secondary | ICD-10-CM | POA: Diagnosis not present

## 2023-11-02 DIAGNOSIS — N184 Chronic kidney disease, stage 4 (severe): Secondary | ICD-10-CM | POA: Diagnosis not present

## 2023-11-02 DIAGNOSIS — E7800 Pure hypercholesterolemia, unspecified: Secondary | ICD-10-CM | POA: Diagnosis not present

## 2023-11-02 DIAGNOSIS — R1312 Dysphagia, oropharyngeal phase: Secondary | ICD-10-CM | POA: Diagnosis not present

## 2023-11-02 DIAGNOSIS — E1122 Type 2 diabetes mellitus with diabetic chronic kidney disease: Secondary | ICD-10-CM | POA: Diagnosis not present

## 2023-11-02 DIAGNOSIS — N189 Chronic kidney disease, unspecified: Secondary | ICD-10-CM | POA: Diagnosis not present

## 2023-11-02 DIAGNOSIS — I69312 Visuospatial deficit and spatial neglect following cerebral infarction: Secondary | ICD-10-CM | POA: Diagnosis not present

## 2023-11-02 DIAGNOSIS — I69398 Other sequelae of cerebral infarction: Secondary | ICD-10-CM | POA: Diagnosis not present

## 2023-11-02 DIAGNOSIS — I69391 Dysphagia following cerebral infarction: Secondary | ICD-10-CM | POA: Diagnosis not present

## 2023-11-02 DIAGNOSIS — I69392 Facial weakness following cerebral infarction: Secondary | ICD-10-CM | POA: Diagnosis not present

## 2023-11-02 DIAGNOSIS — I2510 Atherosclerotic heart disease of native coronary artery without angina pectoris: Secondary | ICD-10-CM | POA: Diagnosis not present

## 2023-11-02 DIAGNOSIS — I639 Cerebral infarction, unspecified: Secondary | ICD-10-CM | POA: Diagnosis not present

## 2023-11-02 DIAGNOSIS — I4891 Unspecified atrial fibrillation: Secondary | ICD-10-CM | POA: Diagnosis not present

## 2023-11-02 DIAGNOSIS — I129 Hypertensive chronic kidney disease with stage 1 through stage 4 chronic kidney disease, or unspecified chronic kidney disease: Secondary | ICD-10-CM | POA: Diagnosis not present

## 2023-11-02 DIAGNOSIS — E213 Hyperparathyroidism, unspecified: Secondary | ICD-10-CM | POA: Diagnosis not present

## 2023-11-02 DIAGNOSIS — I69319 Unspecified symptoms and signs involving cognitive functions following cerebral infarction: Secondary | ICD-10-CM | POA: Insufficient documentation

## 2023-11-02 DIAGNOSIS — N1832 Chronic kidney disease, stage 3b: Secondary | ICD-10-CM | POA: Diagnosis not present

## 2023-11-02 DIAGNOSIS — I69354 Hemiplegia and hemiparesis following cerebral infarction affecting left non-dominant side: Secondary | ICD-10-CM | POA: Diagnosis not present

## 2023-11-02 DIAGNOSIS — Z7901 Long term (current) use of anticoagulants: Secondary | ICD-10-CM | POA: Diagnosis not present

## 2023-11-02 DIAGNOSIS — Z7984 Long term (current) use of oral hypoglycemic drugs: Secondary | ICD-10-CM | POA: Diagnosis not present

## 2023-11-02 DIAGNOSIS — D6861 Antiphospholipid syndrome: Secondary | ICD-10-CM | POA: Diagnosis not present

## 2023-11-02 DIAGNOSIS — M6282 Rhabdomyolysis: Secondary | ICD-10-CM | POA: Diagnosis not present

## 2023-11-04 DIAGNOSIS — E1122 Type 2 diabetes mellitus with diabetic chronic kidney disease: Secondary | ICD-10-CM | POA: Diagnosis not present

## 2023-11-04 DIAGNOSIS — I639 Cerebral infarction, unspecified: Secondary | ICD-10-CM | POA: Diagnosis not present

## 2023-11-04 DIAGNOSIS — I4891 Unspecified atrial fibrillation: Secondary | ICD-10-CM | POA: Diagnosis not present

## 2023-11-04 DIAGNOSIS — I129 Hypertensive chronic kidney disease with stage 1 through stage 4 chronic kidney disease, or unspecified chronic kidney disease: Secondary | ICD-10-CM | POA: Diagnosis not present

## 2023-11-04 DIAGNOSIS — N189 Chronic kidney disease, unspecified: Secondary | ICD-10-CM | POA: Diagnosis not present

## 2023-11-04 DIAGNOSIS — D6861 Antiphospholipid syndrome: Secondary | ICD-10-CM | POA: Diagnosis not present

## 2023-11-04 DIAGNOSIS — Z7901 Long term (current) use of anticoagulants: Secondary | ICD-10-CM | POA: Diagnosis not present

## 2023-11-04 DIAGNOSIS — I2510 Atherosclerotic heart disease of native coronary artery without angina pectoris: Secondary | ICD-10-CM | POA: Diagnosis not present

## 2023-11-06 DIAGNOSIS — N184 Chronic kidney disease, stage 4 (severe): Secondary | ICD-10-CM | POA: Diagnosis not present

## 2023-11-06 DIAGNOSIS — Z7901 Long term (current) use of anticoagulants: Secondary | ICD-10-CM | POA: Diagnosis not present

## 2023-11-06 DIAGNOSIS — I129 Hypertensive chronic kidney disease with stage 1 through stage 4 chronic kidney disease, or unspecified chronic kidney disease: Secondary | ICD-10-CM | POA: Diagnosis not present

## 2023-11-06 DIAGNOSIS — I639 Cerebral infarction, unspecified: Secondary | ICD-10-CM | POA: Diagnosis not present

## 2023-11-06 DIAGNOSIS — I69354 Hemiplegia and hemiparesis following cerebral infarction affecting left non-dominant side: Secondary | ICD-10-CM | POA: Diagnosis not present

## 2023-11-06 DIAGNOSIS — I4891 Unspecified atrial fibrillation: Secondary | ICD-10-CM | POA: Diagnosis not present

## 2023-11-06 DIAGNOSIS — E1122 Type 2 diabetes mellitus with diabetic chronic kidney disease: Secondary | ICD-10-CM | POA: Diagnosis not present

## 2023-11-09 DIAGNOSIS — I69354 Hemiplegia and hemiparesis following cerebral infarction affecting left non-dominant side: Secondary | ICD-10-CM | POA: Diagnosis not present

## 2023-11-09 DIAGNOSIS — Z7901 Long term (current) use of anticoagulants: Secondary | ICD-10-CM | POA: Diagnosis not present

## 2023-11-09 DIAGNOSIS — E1122 Type 2 diabetes mellitus with diabetic chronic kidney disease: Secondary | ICD-10-CM | POA: Diagnosis not present

## 2023-11-09 DIAGNOSIS — N184 Chronic kidney disease, stage 4 (severe): Secondary | ICD-10-CM | POA: Diagnosis not present

## 2023-11-09 DIAGNOSIS — I639 Cerebral infarction, unspecified: Secondary | ICD-10-CM | POA: Diagnosis not present

## 2023-11-09 DIAGNOSIS — I4891 Unspecified atrial fibrillation: Secondary | ICD-10-CM | POA: Diagnosis not present

## 2023-11-09 DIAGNOSIS — I129 Hypertensive chronic kidney disease with stage 1 through stage 4 chronic kidney disease, or unspecified chronic kidney disease: Secondary | ICD-10-CM | POA: Diagnosis not present

## 2023-11-10 DIAGNOSIS — I69391 Dysphagia following cerebral infarction: Secondary | ICD-10-CM | POA: Diagnosis not present

## 2023-11-10 DIAGNOSIS — N184 Chronic kidney disease, stage 4 (severe): Secondary | ICD-10-CM | POA: Diagnosis not present

## 2023-11-10 DIAGNOSIS — G510 Bell's palsy: Secondary | ICD-10-CM | POA: Diagnosis not present

## 2023-11-10 DIAGNOSIS — I639 Cerebral infarction, unspecified: Secondary | ICD-10-CM | POA: Diagnosis not present

## 2023-11-10 DIAGNOSIS — I4891 Unspecified atrial fibrillation: Secondary | ICD-10-CM | POA: Diagnosis not present

## 2023-11-10 DIAGNOSIS — I129 Hypertensive chronic kidney disease with stage 1 through stage 4 chronic kidney disease, or unspecified chronic kidney disease: Secondary | ICD-10-CM | POA: Diagnosis not present

## 2023-11-10 DIAGNOSIS — E213 Hyperparathyroidism, unspecified: Secondary | ICD-10-CM | POA: Diagnosis not present

## 2023-11-10 DIAGNOSIS — I69354 Hemiplegia and hemiparesis following cerebral infarction affecting left non-dominant side: Secondary | ICD-10-CM | POA: Diagnosis not present

## 2023-11-10 DIAGNOSIS — I69398 Other sequelae of cerebral infarction: Secondary | ICD-10-CM | POA: Diagnosis not present

## 2023-11-10 DIAGNOSIS — I69312 Visuospatial deficit and spatial neglect following cerebral infarction: Secondary | ICD-10-CM | POA: Diagnosis not present

## 2023-11-10 DIAGNOSIS — R1312 Dysphagia, oropharyngeal phase: Secondary | ICD-10-CM | POA: Diagnosis not present

## 2023-11-10 DIAGNOSIS — I82512 Chronic embolism and thrombosis of left femoral vein: Secondary | ICD-10-CM | POA: Diagnosis not present

## 2023-11-10 DIAGNOSIS — N1832 Chronic kidney disease, stage 3b: Secondary | ICD-10-CM | POA: Diagnosis not present

## 2023-11-10 DIAGNOSIS — E1122 Type 2 diabetes mellitus with diabetic chronic kidney disease: Secondary | ICD-10-CM | POA: Diagnosis not present

## 2023-11-10 DIAGNOSIS — I2510 Atherosclerotic heart disease of native coronary artery without angina pectoris: Secondary | ICD-10-CM | POA: Diagnosis not present

## 2023-11-10 DIAGNOSIS — I69392 Facial weakness following cerebral infarction: Secondary | ICD-10-CM | POA: Diagnosis not present

## 2023-11-10 DIAGNOSIS — M6281 Muscle weakness (generalized): Secondary | ICD-10-CM | POA: Diagnosis not present

## 2023-11-10 DIAGNOSIS — Z7901 Long term (current) use of anticoagulants: Secondary | ICD-10-CM | POA: Diagnosis not present

## 2023-11-10 DIAGNOSIS — Z7984 Long term (current) use of oral hypoglycemic drugs: Secondary | ICD-10-CM | POA: Diagnosis not present

## 2023-11-10 DIAGNOSIS — E7800 Pure hypercholesterolemia, unspecified: Secondary | ICD-10-CM | POA: Diagnosis not present

## 2023-11-11 DIAGNOSIS — I129 Hypertensive chronic kidney disease with stage 1 through stage 4 chronic kidney disease, or unspecified chronic kidney disease: Secondary | ICD-10-CM | POA: Diagnosis not present

## 2023-11-11 DIAGNOSIS — Z7901 Long term (current) use of anticoagulants: Secondary | ICD-10-CM | POA: Diagnosis not present

## 2023-11-11 DIAGNOSIS — I639 Cerebral infarction, unspecified: Secondary | ICD-10-CM | POA: Diagnosis not present

## 2023-11-11 DIAGNOSIS — I69354 Hemiplegia and hemiparesis following cerebral infarction affecting left non-dominant side: Secondary | ICD-10-CM | POA: Diagnosis not present

## 2023-11-11 DIAGNOSIS — I2510 Atherosclerotic heart disease of native coronary artery without angina pectoris: Secondary | ICD-10-CM | POA: Diagnosis not present

## 2023-11-11 DIAGNOSIS — N184 Chronic kidney disease, stage 4 (severe): Secondary | ICD-10-CM | POA: Diagnosis not present

## 2023-11-11 DIAGNOSIS — E1122 Type 2 diabetes mellitus with diabetic chronic kidney disease: Secondary | ICD-10-CM | POA: Diagnosis not present

## 2023-11-11 DIAGNOSIS — I4891 Unspecified atrial fibrillation: Secondary | ICD-10-CM | POA: Diagnosis not present

## 2023-11-16 DIAGNOSIS — I129 Hypertensive chronic kidney disease with stage 1 through stage 4 chronic kidney disease, or unspecified chronic kidney disease: Secondary | ICD-10-CM | POA: Diagnosis not present

## 2023-11-16 DIAGNOSIS — I4891 Unspecified atrial fibrillation: Secondary | ICD-10-CM | POA: Diagnosis not present

## 2023-11-16 DIAGNOSIS — E1122 Type 2 diabetes mellitus with diabetic chronic kidney disease: Secondary | ICD-10-CM | POA: Diagnosis not present

## 2023-11-16 DIAGNOSIS — I69354 Hemiplegia and hemiparesis following cerebral infarction affecting left non-dominant side: Secondary | ICD-10-CM | POA: Diagnosis not present

## 2023-11-16 DIAGNOSIS — Z7901 Long term (current) use of anticoagulants: Secondary | ICD-10-CM | POA: Diagnosis not present

## 2023-11-16 DIAGNOSIS — I82512 Chronic embolism and thrombosis of left femoral vein: Secondary | ICD-10-CM | POA: Diagnosis not present

## 2023-11-16 DIAGNOSIS — D6861 Antiphospholipid syndrome: Secondary | ICD-10-CM | POA: Diagnosis not present

## 2023-11-16 DIAGNOSIS — I639 Cerebral infarction, unspecified: Secondary | ICD-10-CM | POA: Diagnosis not present

## 2023-11-16 DIAGNOSIS — N184 Chronic kidney disease, stage 4 (severe): Secondary | ICD-10-CM | POA: Diagnosis not present

## 2023-11-18 DIAGNOSIS — I69354 Hemiplegia and hemiparesis following cerebral infarction affecting left non-dominant side: Secondary | ICD-10-CM | POA: Diagnosis not present

## 2023-11-18 DIAGNOSIS — I4891 Unspecified atrial fibrillation: Secondary | ICD-10-CM | POA: Diagnosis not present

## 2023-11-18 DIAGNOSIS — E1122 Type 2 diabetes mellitus with diabetic chronic kidney disease: Secondary | ICD-10-CM | POA: Diagnosis not present

## 2023-11-18 DIAGNOSIS — Z7901 Long term (current) use of anticoagulants: Secondary | ICD-10-CM | POA: Diagnosis not present

## 2023-11-18 DIAGNOSIS — I639 Cerebral infarction, unspecified: Secondary | ICD-10-CM | POA: Diagnosis not present

## 2023-11-18 DIAGNOSIS — I129 Hypertensive chronic kidney disease with stage 1 through stage 4 chronic kidney disease, or unspecified chronic kidney disease: Secondary | ICD-10-CM | POA: Diagnosis not present

## 2023-11-18 DIAGNOSIS — I82512 Chronic embolism and thrombosis of left femoral vein: Secondary | ICD-10-CM | POA: Diagnosis not present

## 2023-11-18 DIAGNOSIS — D6861 Antiphospholipid syndrome: Secondary | ICD-10-CM | POA: Diagnosis not present

## 2023-11-18 DIAGNOSIS — N184 Chronic kidney disease, stage 4 (severe): Secondary | ICD-10-CM | POA: Diagnosis not present

## 2023-11-23 DIAGNOSIS — I639 Cerebral infarction, unspecified: Secondary | ICD-10-CM | POA: Diagnosis not present

## 2023-11-23 DIAGNOSIS — N184 Chronic kidney disease, stage 4 (severe): Secondary | ICD-10-CM | POA: Diagnosis not present

## 2023-11-23 DIAGNOSIS — E1122 Type 2 diabetes mellitus with diabetic chronic kidney disease: Secondary | ICD-10-CM | POA: Diagnosis not present

## 2023-11-23 DIAGNOSIS — I4891 Unspecified atrial fibrillation: Secondary | ICD-10-CM | POA: Diagnosis not present

## 2023-11-23 DIAGNOSIS — I69354 Hemiplegia and hemiparesis following cerebral infarction affecting left non-dominant side: Secondary | ICD-10-CM | POA: Diagnosis not present

## 2023-11-23 DIAGNOSIS — D6861 Antiphospholipid syndrome: Secondary | ICD-10-CM | POA: Diagnosis not present

## 2023-11-23 DIAGNOSIS — I82512 Chronic embolism and thrombosis of left femoral vein: Secondary | ICD-10-CM | POA: Diagnosis not present

## 2023-11-23 DIAGNOSIS — I129 Hypertensive chronic kidney disease with stage 1 through stage 4 chronic kidney disease, or unspecified chronic kidney disease: Secondary | ICD-10-CM | POA: Diagnosis not present

## 2023-11-23 DIAGNOSIS — Z7901 Long term (current) use of anticoagulants: Secondary | ICD-10-CM | POA: Diagnosis not present

## 2023-12-01 ENCOUNTER — Other Ambulatory Visit: Payer: Self-pay | Admitting: Internal Medicine

## 2023-12-01 DIAGNOSIS — E118 Type 2 diabetes mellitus with unspecified complications: Secondary | ICD-10-CM

## 2023-12-01 NOTE — Telephone Encounter (Signed)
 Requested Prescriptions  Pending Prescriptions Disp Refills   glipiZIDE  (GLUCOTROL  XL) 2.5 MG 24 hr tablet [Pharmacy Med Name: GLIPIZIDE  ER 2.5 MG TABLET] 90 tablet 0    Sig: TAKE 1 TABLET BY MOUTH DAILY WITH BREAKFAST.     Endocrinology:  Diabetes - Sulfonylureas Failed - 12/01/2023  4:20 PM      Failed - Cr in normal range and within 360 days    Creatinine  Date Value Ref Range Status  06/11/2023 1.7 (A) 0.6 - 1.3 Final   Creatinine, Ser  Date Value Ref Range Status  04/17/2023 1.68 (H) 0.61 - 1.24 mg/dL Final   Creatinine, Urine  Date Value Ref Range Status  06/11/2023 184  Final         Passed - HBA1C is between 0 and 7.9 and within 180 days    Hemoglobin A1C  Date Value Ref Range Status  09/23/2023 5.9 (A) 4.0 - 5.6 % Final  01/16/2023 6.3  Final         Passed - Valid encounter within last 6 months    Recent Outpatient Visits           2 months ago Type II diabetes mellitus with complication [facility])   [place] [facility] at [facility], Chales Colorado, MD       Future Appointments             In 1 month Gala Jubilee, Chales Colorado, MD [facility] Medicine at [facility], [facility]

## 2023-12-02 ENCOUNTER — Telehealth: Payer: Self-pay | Admitting: *Deleted

## 2023-12-02 NOTE — Telephone Encounter (Signed)
 When I look at the appts and the next appt is 04/15/2024. He says he is walking  with PT and he is getting better. He will come in September .

## 2023-12-04 DIAGNOSIS — I129 Hypertensive chronic kidney disease with stage 1 through stage 4 chronic kidney disease, or unspecified chronic kidney disease: Secondary | ICD-10-CM | POA: Diagnosis not present

## 2023-12-04 DIAGNOSIS — I639 Cerebral infarction, unspecified: Secondary | ICD-10-CM | POA: Diagnosis not present

## 2023-12-04 DIAGNOSIS — D6861 Antiphospholipid syndrome: Secondary | ICD-10-CM | POA: Diagnosis not present

## 2023-12-04 DIAGNOSIS — E1122 Type 2 diabetes mellitus with diabetic chronic kidney disease: Secondary | ICD-10-CM | POA: Diagnosis not present

## 2023-12-04 DIAGNOSIS — I69354 Hemiplegia and hemiparesis following cerebral infarction affecting left non-dominant side: Secondary | ICD-10-CM | POA: Diagnosis not present

## 2023-12-04 DIAGNOSIS — Z7901 Long term (current) use of anticoagulants: Secondary | ICD-10-CM | POA: Diagnosis not present

## 2023-12-04 DIAGNOSIS — N184 Chronic kidney disease, stage 4 (severe): Secondary | ICD-10-CM | POA: Diagnosis not present

## 2023-12-10 DIAGNOSIS — I69354 Hemiplegia and hemiparesis following cerebral infarction affecting left non-dominant side: Secondary | ICD-10-CM | POA: Diagnosis not present

## 2023-12-10 DIAGNOSIS — E7800 Pure hypercholesterolemia, unspecified: Secondary | ICD-10-CM | POA: Diagnosis not present

## 2023-12-10 DIAGNOSIS — I69398 Other sequelae of cerebral infarction: Secondary | ICD-10-CM | POA: Diagnosis not present

## 2023-12-10 DIAGNOSIS — I69312 Visuospatial deficit and spatial neglect following cerebral infarction: Secondary | ICD-10-CM | POA: Diagnosis not present

## 2023-12-10 DIAGNOSIS — E1122 Type 2 diabetes mellitus with diabetic chronic kidney disease: Secondary | ICD-10-CM | POA: Diagnosis not present

## 2023-12-10 DIAGNOSIS — E213 Hyperparathyroidism, unspecified: Secondary | ICD-10-CM | POA: Diagnosis not present

## 2023-12-10 DIAGNOSIS — Z7984 Long term (current) use of oral hypoglycemic drugs: Secondary | ICD-10-CM | POA: Diagnosis not present

## 2023-12-10 DIAGNOSIS — R1312 Dysphagia, oropharyngeal phase: Secondary | ICD-10-CM | POA: Diagnosis not present

## 2023-12-10 DIAGNOSIS — Z7901 Long term (current) use of anticoagulants: Secondary | ICD-10-CM | POA: Diagnosis not present

## 2023-12-10 DIAGNOSIS — I69391 Dysphagia following cerebral infarction: Secondary | ICD-10-CM | POA: Diagnosis not present

## 2023-12-10 DIAGNOSIS — I129 Hypertensive chronic kidney disease with stage 1 through stage 4 chronic kidney disease, or unspecified chronic kidney disease: Secondary | ICD-10-CM | POA: Diagnosis not present

## 2023-12-10 DIAGNOSIS — M6281 Muscle weakness (generalized): Secondary | ICD-10-CM | POA: Diagnosis not present

## 2023-12-10 DIAGNOSIS — I69392 Facial weakness following cerebral infarction: Secondary | ICD-10-CM | POA: Diagnosis not present

## 2023-12-10 DIAGNOSIS — N1832 Chronic kidney disease, stage 3b: Secondary | ICD-10-CM | POA: Diagnosis not present

## 2024-01-01 DIAGNOSIS — N183 Chronic kidney disease, stage 3 unspecified: Secondary | ICD-10-CM | POA: Diagnosis not present

## 2024-01-01 DIAGNOSIS — E1122 Type 2 diabetes mellitus with diabetic chronic kidney disease: Secondary | ICD-10-CM | POA: Diagnosis not present

## 2024-01-01 DIAGNOSIS — I82512 Chronic embolism and thrombosis of left femoral vein: Secondary | ICD-10-CM | POA: Diagnosis not present

## 2024-01-01 DIAGNOSIS — I2510 Atherosclerotic heart disease of native coronary artery without angina pectoris: Secondary | ICD-10-CM | POA: Diagnosis not present

## 2024-01-01 DIAGNOSIS — D6861 Antiphospholipid syndrome: Secondary | ICD-10-CM | POA: Diagnosis not present

## 2024-01-01 DIAGNOSIS — I69354 Hemiplegia and hemiparesis following cerebral infarction affecting left non-dominant side: Secondary | ICD-10-CM | POA: Diagnosis not present

## 2024-01-01 DIAGNOSIS — I129 Hypertensive chronic kidney disease with stage 1 through stage 4 chronic kidney disease, or unspecified chronic kidney disease: Secondary | ICD-10-CM | POA: Diagnosis not present

## 2024-01-01 DIAGNOSIS — Z7901 Long term (current) use of anticoagulants: Secondary | ICD-10-CM | POA: Diagnosis not present

## 2024-01-01 DIAGNOSIS — I639 Cerebral infarction, unspecified: Secondary | ICD-10-CM | POA: Diagnosis not present

## 2024-01-02 DIAGNOSIS — I69354 Hemiplegia and hemiparesis following cerebral infarction affecting left non-dominant side: Secondary | ICD-10-CM | POA: Diagnosis not present

## 2024-01-06 DIAGNOSIS — R2681 Unsteadiness on feet: Secondary | ICD-10-CM | POA: Diagnosis not present

## 2024-01-06 DIAGNOSIS — J439 Emphysema, unspecified: Secondary | ICD-10-CM | POA: Diagnosis not present

## 2024-01-06 DIAGNOSIS — Z008 Encounter for other general examination: Secondary | ICD-10-CM | POA: Diagnosis not present

## 2024-01-07 ENCOUNTER — Other Ambulatory Visit: Payer: Self-pay

## 2024-01-07 ENCOUNTER — Emergency Department
Admission: EM | Admit: 2024-01-07 | Discharge: 2024-01-11 | Disposition: A | Discharge status: Home / Self Care | Attending: Emergency Medicine | Admitting: Emergency Medicine

## 2024-01-07 ENCOUNTER — Telehealth: Payer: Self-pay

## 2024-01-07 DIAGNOSIS — R29810 Facial weakness: Secondary | ICD-10-CM | POA: Diagnosis not present

## 2024-01-07 DIAGNOSIS — R262 Difficulty in walking, not elsewhere classified: Secondary | ICD-10-CM | POA: Insufficient documentation

## 2024-01-07 DIAGNOSIS — Z789 Other specified health status: Secondary | ICD-10-CM

## 2024-01-07 DIAGNOSIS — Z7389 Other problems related to life management difficulty: Secondary | ICD-10-CM | POA: Diagnosis not present

## 2024-01-07 DIAGNOSIS — R531 Weakness: Secondary | ICD-10-CM | POA: Diagnosis not present

## 2024-01-07 DIAGNOSIS — R0902 Hypoxemia: Secondary | ICD-10-CM | POA: Diagnosis not present

## 2024-01-07 LAB — COMPREHENSIVE METABOLIC PANEL WITH GFR
ALT: 24 U/L (ref 0–44)
AST: 23 U/L (ref 15–41)
Albumin: 3.7 g/dL (ref 3.5–5.0)
Alkaline Phosphatase: 93 U/L (ref 38–126)
Anion gap: 8 (ref 5–15)
BUN: 28 mg/dL — ABNORMAL HIGH (ref 8–23)
CO2: 24 mmol/L (ref 22–32)
Calcium: 9 mg/dL (ref 8.9–10.3)
Chloride: 109 mmol/L (ref 98–111)
Creatinine, Ser: 1.71 mg/dL — ABNORMAL HIGH (ref 0.61–1.24)
GFR, Estimated: 43 mL/min — ABNORMAL LOW (ref >60)
Glucose, Bld: 256 mg/dL — ABNORMAL HIGH (ref 70–99)
Potassium: 3.8 mmol/L (ref 3.5–5.1)
Sodium: 141 mmol/L (ref 135–145)
Total Bilirubin: 1 mg/dL (ref 0.0–1.2)
Total Protein: 6.3 g/dL — ABNORMAL LOW (ref 6.5–8.1)

## 2024-01-07 LAB — CBC
HCT: 42.6 % (ref 39.0–52.0)
Hemoglobin: 14.3 g/dL (ref 13.0–17.0)
MCH: 30.1 pg (ref 26.0–34.0)
MCHC: 33.6 g/dL (ref 30.0–36.0)
MCV: 89.7 fL (ref 80.0–100.0)
Platelets: 327 10*3/uL (ref 150–400)
RBC: 4.75 MIL/uL (ref 4.22–5.81)
RDW: 14.5 % (ref 11.5–15.5)
WBC: 10.5 10*3/uL (ref 4.0–10.5)
nRBC: 0 % (ref 0.0–0.2)

## 2024-01-07 NOTE — ED Provider Notes (Signed)
 [facility] Provider Note    Event Date/Time   First MD Initiated Contact with Patient 01/07/24 1530     (approximate)   History   Weakness   HPI  Corey Howard is a 73 y.o. male who presents to the emergency department today after he was told by the sheriff's department that he should come to the emergency department.  He states that they came out for a well check. He is not sure who called for the well check. He was released from rehab 4 days ago after having a hospitalization for a stroke. He has home health that comes Monday, Wednesday and would have come tomorrow. He says that it has been hard for him to do his ADLs by himself. Does live with someone but they have their own health issues so are not much help. The patient denies any medical complaints.      Physical Exam   Triage Vital Signs: ED Triage Vitals  Encounter Vitals Group     BP 01/07/24 1424 114/68     Systolic BP Percentile --      Diastolic BP Percentile --      Pulse Rate 01/07/24 1424 63     Resp 01/07/24 1424 20     Temp 01/07/24 1424 98 F (36.7 C)     Temp Source 01/07/24 1424 Oral     SpO2 01/07/24 1424 100 %     Weight --      Height --      Head Circumference --      Peak Flow --      Pain Score 01/07/24 1423 0     Pain Loc --      Pain Education --      Exclude from Growth Chart --     Most recent vital signs: Vitals:   01/07/24 1424  BP: 114/68  Pulse: 63  Resp: 20  Temp: 98 F (36.7 C)  SpO2: 100%   General: Awake, alert, oriented. CV:  Good peripheral perfusion. Regular rate and rhythm. Resp:  Normal effort. Lungs clear. Abd:  No distention.    ED Results / Procedures / Treatments   Labs (all labs ordered are listed, but only abnormal results are displayed) Labs Reviewed  COMPREHENSIVE METABOLIC PANEL WITH GFR - Abnormal; Notable for the following components:      Result Value   Glucose, Bld 256 (*)    BUN 28 (*)    Creatinine, Ser 1.71 (*)     Total Protein 6.3 (*)    GFR, Estimated 43 (*)    All other components within normal limits  CBC  URINALYSIS, ROUTINE W REFLEX MICROSCOPIC  CBG MONITORING, ED     EKG  I, Marylynn Soho, attending physician, personally viewed and interpreted this EKG  EKG Time: 1426 Rate: 63 Rhythm: atrial flutter Axis: left axis deviation Intervals: qtc 431 QRS: narrow, q waves v1, v2, v3 ST changes: no st elevation Impression: abnormal ekg  RADIOLOGY None  PROCEDURES:  Critical Care performed: No    MEDICATIONS ORDERED IN ED: Medications - No data to display   IMPRESSION / MDM / ASSESSMENT AND PLAN / ED COURSE  I reviewed the triage vital signs and the nursing notes.                              Patient presented to the emergency department today at the recommendation of the  sheriffs department after a well check. Unclear who called the well check. It does sound like patient is having a hard time performing his ADLs. Will have social work evaluate.    FINAL CLINICAL IMPRESSION(S) / ED DIAGNOSES   Final diagnoses:  Loss of functional autonomy in activities of daily living (ADLs)        Rx / DC Orders    Note:  This document was prepared using Dragon voice recognition software and may include unintentional dictation errors.    Marylynn Soho, MD 01/07/24 [phone]

## 2024-01-07 NOTE — Telephone Encounter (Signed)
 Noted   Copied from CRM [id number]. Topic: General - Other >> Jan 07, 2024 11:56 AM Georgeann Kindred wrote: Reason for CRM: Carmelina Chinchilla [facility], states that they received a referral for Skilled Nursing, Occupational Therapy, and Physical Therapy 01/01/24 for the patient. She spoke with the son, Larinda Plover, and he stated the family is getting together to have a meeting on whether or not to place the patient in a skilled nursing facility. Due to this, there will be a Delay in Care. The referral will be placed on hold until Monday, 01/11/24. Please contact [phone] for additional information.

## 2024-01-07 NOTE — Telephone Encounter (Signed)
 Copied from CRM [id number]. Topic: Clinical - Home Health Verbal Orders >> Jan 07, 2024  9:13 AM Rosaria Common wrote: Pt calling due to recent stroke and back home. Requesting a home health aide for care. Callback  umber is [phone].

## 2024-01-07 NOTE — ED Notes (Signed)
 This RN provided peri care after pt had a episode of stool and urine incontinence. New linen and warm blankets provided. Pt tolerated well. Pt socks and pants placed in belongings bag. Fall bundle implemented. Pt set up right to eat dinner. NAD, no further needs expressed. CB within reach.

## 2024-01-07 NOTE — ED Triage Notes (Addendum)
 Pt to ED via [facility] from home. Pt has CVA on 3/13. Pt sent to [facility] for rehab and got home on Saturday. Pt lives with someone who is also disabled. Pt has aid coming 3x a week. EMS reports failure to thrive. Pt reports left sided weakness from prior CVA. EMS reports left facial droop from bell's palsy. Pt reports is able to transfer to wheelchair. Pt reports increased weakness and difficulty with ADLs. Pt with hx of afib and on blood thinner.   EMS VS:  139/90 HR 73 95% RA CBG 235

## 2024-01-08 LAB — URINALYSIS, ROUTINE W REFLEX MICROSCOPIC
Bacteria, UA: NONE SEEN
Bilirubin Urine: NEGATIVE
Glucose, UA: >=500 mg/dL — AB
Hgb urine dipstick: NEGATIVE
Ketones, ur: NEGATIVE mg/dL
Leukocytes,Ua: NEGATIVE
Nitrite: NEGATIVE
Protein, ur: NEGATIVE mg/dL
Specific Gravity, Urine: 1.016 (ref 1.005–1.030)
Squamous Epithelial / HPF: 0 /HPF (ref 0–5)
WBC, UA: 0 WBC/hpf (ref 0–5)
pH: 6 (ref 5.0–8.0)

## 2024-01-08 MED ORDER — GLIPIZIDE ER 2.5 MG PO TB24
2.5000 mg | ORAL_TABLET | Freq: Every day | ORAL | Status: DC
  Administered 2024-01-09 – 2024-01-11 (×3): 2.5 mg via ORAL
  Filled 2024-01-08 (×3): qty 1

## 2024-01-08 MED ORDER — APIXABAN 5 MG PO TABS
5.0000 mg | ORAL_TABLET | Freq: Two times a day (BID) | ORAL | Status: DC
  Administered 2024-01-08 – 2024-01-11 (×7): 5 mg via ORAL
  Filled 2024-01-08 (×7): qty 1

## 2024-01-08 MED ORDER — LISINOPRIL 10 MG PO TABS
40.0000 mg | ORAL_TABLET | Freq: Every day | ORAL | Status: DC
  Administered 2024-01-08 – 2024-01-10 (×3): 40 mg via ORAL
  Filled 2024-01-08 (×4): qty 4

## 2024-01-08 MED ORDER — EMPAGLIFLOZIN 10 MG PO TABS
10.0000 mg | ORAL_TABLET | Freq: Every day | ORAL | Status: DC
  Administered 2024-01-08 – 2024-01-11 (×4): 10 mg via ORAL
  Filled 2024-01-08 (×4): qty 1

## 2024-01-08 MED ORDER — ATORVASTATIN CALCIUM 20 MG PO TABS
20.0000 mg | ORAL_TABLET | Freq: Every day | ORAL | Status: DC
  Administered 2024-01-08 – 2024-01-11 (×4): 20 mg via ORAL
  Filled 2024-01-08 (×4): qty 1

## 2024-01-08 NOTE — TOC Progression Note (Signed)
 Transition of Care [facility]) - Progression Note    Patient Details  Name: Corey Howard MRN: [id number] Date of Birth: [birth year]  [facility]) CM/SW Contact  [place] L Elenora [place], [place] Phone Number: 01/08/2024, 11:07 AM  Clinical Narrative:     CSW spoke with pt brother, Corey Howard. Mr. Kassis advised that pt is of sound mind and can make decisions however Mr. Bissonnette disagrees with pt going home. Pt has a roommate who is having medical issues themselves. Mr. Fye stated that roommate can not lift pt and called Mr. Kalas yesterday crying because he was overwhelmed. CSW advised of plan to discharge with HH in place. CSW advised that because pt has not been deemed incompetent he can make his own decisions.   Mr. Hershberger advised that pt had a stroke 60 days ago. He can't get himself proper nutrition and he can not bathe himself. Mr. Dady stated that pt is "dead weight" and he hasn't learned the skills needed to take care of himself at home.   Mr. Shallenberger spoke with Camilo Cella at [facility] who advised family to take pt to the ED so that he can be placed in a SNF.   Mr. Jost wants to take some time to speak with pt to reason with him. Mr. Bena is concerned about safety and risk for pt and roommate trying to be a caregiver. Mr. Dumlao stated that his brother fell and the roommate had to get neighbors to assist.       Expected Discharge Plan and Services In-house Referral: Clinical Social Work                                             Social Determinants of Health (SDOH) Interventions SDOH Screenings   Food Insecurity: No Food Insecurity (10/23/2023)   Received from [facility]  Transportation Needs: No Transportation Needs (10/23/2023)   Received from [facility]  Depression [phone]): Low Risk  (09/23/2023)  Financial Resource Strain: Low Risk  (10/23/2023)   Received from [facility]  Tobacco Use: High Risk (01/07/2024)    Readmission Risk  Interventions     No data to display

## 2024-01-08 NOTE — TOC Progression Note (Signed)
 Transition of Care [facility]) - Progression Note    Patient Details  Name: [name] MRN: [id number] Date of Birth: 14-Mar-1951  Transition of Care [facility]) CM/SW Contact  [place] L Oswaldo [place], [place] Phone Number: 01/08/2024, 11:16 AM  Clinical Narrative:     CSW spoke with Sam Creighton, [employer]. Pt received a wheelchair last week from [employer]. Pt has [employer]. He has a walker but it wasn't in the system as [employer] being the provider that provided the equipment.        Expected Discharge Plan and Services In-house Referral: Clinical Social Work                                             Social Determinants of Health (SDOH) Interventions SDOH Screenings   Food Insecurity: No Food Insecurity (10/23/2023)   Received from [facility]  Transportation Needs: No Transportation Needs (10/23/2023)   Received from [facility]  Depression [phone]): Low Risk  (09/23/2023)  Financial Resource Strain: Low Risk  (10/23/2023)   [facility] from [facility]  Tobacco Use: High Risk (01/07/2024)    Readmission Risk Interventions     No data to display

## 2024-01-08 NOTE — ED Notes (Signed)
 Departure condition charted by mistake

## 2024-01-08 NOTE — ED Notes (Signed)
 PT at bedside.

## 2024-01-08 NOTE — ED Notes (Signed)
 Patient called out using call bell system, requesting to have head of bed elevated and meal tray moved closer.

## 2024-01-08 NOTE — TOC Initial Note (Signed)
 Transition of Care [facility]) - Initial/Assessment Note    Patient Details  Name: Corey Howard MRN: [id number] Date of Birth: [birth year]  Transition of Care [facility]) CM/SW Contact:    [place] L Jaxx Huish, LCSW Phone Number: 01/08/2024, 10:18 AM  Clinical Narrative:                  CSW met with pt to complete [facility] consult. Pt reports that he arrived at [facility] from home. He reported that he was at one time placed at [facility] for rehab after his stroke but he advised that he was discharged after 60 days. Pt declined SNF recommendation. He advised that he would like to discharge home with [facility] in place. Pt. Advised that [facility] is his preferred agency. He stated that he pays someone privately to assist him with his needs in the home but agreed that it would be beneficial for him to receive services through an agency provider.   Pt. Uses a walker and a wheelchair in the home. Pt would like a new walker. CSW to follow up with [employer] to see if pt is eligible.   Pt gave verbal consent to talk with his brothers, Erla Haw and Larinda Plover. He stated that they have been trying to help him although it feels more like meddling.   Pt confirmed his address to [address] [place], [place] [address]. His phone number is [phone].   Pt is concerned about losing his home if he has to be placed in a SNF for LTC. He stated that he lives with a roommate that helps him and he has a cat that he is concerned about.        Patient Goals and CMS Choice            Expected Discharge Plan and Services In-house Referral: Clinical Social Work                                            Prior Living Arrangements/Services   Lives with:: Roommate Patient language and need for interpreter reviewed:: Yes Do you feel safe going back to the place where you live?: Yes (With HH in place)      Need for Family Participation in Patient Care: Yes (Comment) Care giver support system in place?: Yes (comment) Current home  services: Other (comment) (Pt reported that he hired someone privately to assist him but the individual is not affiliated with an agency.) Criminal Activity/Legal Involvement Pertinent to Current Situation/Hospitalization: No - Comment as needed  Activities of Daily Living      Permission Sought/Granted Permission sought to share information with : Family Supports Permission granted to share information with : Yes, Verbal Permission Granted  Share Information with NAME: Erla Haw and Larinda Plover [phone] [facility])     Permission granted to share info w Relationship: Brothers  Permission granted to share info w Contact Information: Brothers  Emotional Assessment Appearance:: Appears older than stated age Attitude/Demeanor/Rapport: Ambitious Affect (typically observed): Accepting Orientation: : Oriented to  Time, Oriented to Situation, Oriented to Place, Oriented to Self Alcohol / Substance Use: Never Used Psych Involvement: No (comment)  Admission diagnosis:  Failure to Thrive Patient Active Problem List   Diagnosis Date Noted   Acquired thrombophilia (HCC) 06/08/2023   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 [place] [address] Phone: [phone] Fax: [phone]  [facility] [address] [place] [address] Phone: [phone] Fax: [phone]     Social Drivers of Health (SDOH) Social History: SDOH Screenings   Food Insecurity: No Food Insecurity (10/23/2023)   Received from [facility]  Transportation Needs: No Transportation Needs (10/23/2023)    Received from [facility]  Depression [phone]): Low Risk  (09/23/2023)  Financial Resource Strain: Low Risk  (10/23/2023)   Received from [facility]  Tobacco Use: High Risk (01/07/2024)   SDOH Interventions:     Readmission Risk Interventions     No data to display

## 2024-01-08 NOTE — TOC Progression Note (Signed)
 Transition of Care [facility]) - Progression Note    Patient Details  Name: Corey Howard MRN: [id number] Date of Birth: 1950-09-05  Transition of Care [facility]) CM/SW Contact  [place] L Linton [place], [place] Phone Number: 01/08/2024, 12:02 PM  Clinical Narrative:     CSW went to pt room to allow pt to use phone. [name], pt brother wanted to speak with him and express concerns regarding SNF. [name] advised that the roommate has moved out of the home and pt would be alone if he is discharged. CSW allowed [name] to do all of the talking. [name] stated that pt has no help at home and he doesn't even have transportation. [name] promised pt that his home will be taken care of as well as his animal. [name] pleased with pt to discharge to a SNF.   Pt verbally agreed to CSW to go to a SNF. Pt advised that [facility] is his preference. He advised that he is open to other offers as well.   FL2 will be completed.        Expected Discharge Plan and Services In-house Referral: Clinical Social Work                                             Social Determinants of Health (SDOH) Interventions SDOH Screenings   Food Insecurity: No Food Insecurity (10/23/2023)   Received from [facility]  Transportation Needs: No Transportation Needs (10/23/2023)   [facility]  Depression [phone]): Low Risk  (09/23/2023)  Financial Resource Strain: Low Risk  (10/23/2023)   [facility] from [facility]  Tobacco Use: High Risk (01/07/2024)    Readmission Risk Interventions     No data to display

## 2024-01-08 NOTE — Discharge Instructions (Addendum)
 Referral called to [facility] to help you with resources, home health care, and care giving. I spoke with Dione and their # is [phone], and she will contact you.   [facility] [address] [place], [place] [address] [phone]  -Builds on existing community services to provide a full-continuum of care -Coordinates care through an interdisciplinary care team in collaboration with participants and their loved ones; -Manages care individually, with emphasis on specific circumstances of participants' health, ability to care for themselves, the complexity of family relationships, as well as the goals and desires of the participant and their caregivers  Referral made to [facility]. Services have been set up for home care with [facility] [phone]

## 2024-01-08 NOTE — Evaluation (Signed)
 Physical Therapy Evaluation Patient Details Name: Corey Howard MRN: [id number] DOB: 18-Oct-1950 Today's Date: 01/08/2024  History of Present Illness  Pt is a 73 yo male that presented to ED after a wellness check, difficulty caring for himself. Pt recently at SNF due to a CVA in March. PMH of atrial flutter, DVT, DM, HTN, CKD, Bell's Palsy.  Clinical Impression  Pt alert, agreeable to PT, motivated to mobilize as much as possible. Per pt he has been home ~5 days after a 60 day stay at [facility] due to a CVA in March. His roommate was assisting with ADLs and IADLs as able, but not truly able to provide safe physical assistance. Pt was propelling WC in home with his legs the last several days.  Noted for LLE/LUE coordination and strength deficits. Supine to sit with modA, extra time, pt unable to complete without help. Sit <> stand with RW and modA, but strong posterior lean. Second assist obtained to side step at EOB, maxAx2. Pt very unsteady and required assistance with RW management as well. Unable to truly ambulate today due to posterior lean. Pt returned to bed modAx2, and performed rolling R and L (min-modA) for linen change.  Overall the patient demonstrated deficits (see "PT Problem List") that impede the patient's functional abilities, safety, and mobility and would benefit from skilled PT intervention.          If plan is discharge home, recommend the following: Two people to help with walking and/or transfers;A lot of help with bathing/dressing/bathroom;Assistance with cooking/housework;Assist for transportation;Help with stairs or ramp for entrance   Can travel by private vehicle        Equipment Recommendations Rolling walker (2 wheels)  Recommendations for Other Services       Functional Status Assessment Patient has had a recent decline in their functional status and demonstrates the ability to make significant improvements in function in a reasonable and predictable  amount of time.     Precautions / Restrictions Precautions Precautions: Fall Recall of Precautions/Restrictions: Intact Restrictions Weight Bearing Restrictions Per Provider Order: No      Mobility  Bed Mobility Overal bed mobility: Needs Assistance Bed Mobility: Supine to Sit, Sit to Supine, Rolling Rolling: Mod assist, Used rails   Supine to sit: Mod assist, HOB elevated Sit to supine: Mod assist, +2 for physical assistance   General bed mobility comments: extra time, unable to complete without assistance    Transfers Overall transfer level: Needs assistance Equipment used: Rolling walker (2 wheels) Transfers: Sit to/from Stand Sit to Stand: Mod assist, +2 safety/equipment, From elevated surface, Max assist, +2 physical assistance           General transfer comment: able to stand from gurney modA, but unable to ambulate or safely take a step. 2nd assist for second attempt to side step EOB maxAx2    Ambulation/Gait               General Gait Details: unable  Stairs            Wheelchair Mobility     Tilt Bed    Modified Rankin (Stroke Patients Only)       Balance Overall balance assessment: Needs assistance Sitting-balance support: Feet supported Sitting balance-Leahy Scale: Fair     Standing balance support: Bilateral upper extremity supported, During functional activity, Reliant on assistive device for balance Standing balance-Leahy Scale: Poor  Pertinent Vitals/Pain Pain Assessment Pain Assessment: No/denies pain    Home Living Family/patient expects to be discharged to:: Private residence Living Arrangements: Other (Comment) (roommate) Available Help at Discharge: Other (Comment) (according to chart roommate just moved out) Type of Home: House Home Access: Ramped entrance       Home Layout: One level Home Equipment: Agricultural consultant (2 wheels);Cane - single point;Wheelchair -  manual;Shower seat;Grab bars - tub/shower      Prior Function Prior Level of Function : Needs assist             Mobility Comments: pt stated he was kicking around in his WC since discharge home (~5 days), unable to walk on his own yet ADLs Comments: aide to help with sink baths/bird baths per pt, roommate performing IADLs     Extremity/Trunk Assessment   Upper Extremity Assessment Upper Extremity Assessment: Defer to OT evaluation (LUE weakness and coordination deficits evident)    Lower Extremity Assessment Lower Extremity Assessment:  (LLE noted for gross and fine motor coordination deficits, able to move limb against gravity but significant difficulty with weight bearing)       Communication        Cognition Arousal: Alert Behavior During Therapy: WFL for tasks assessed/performed   PT - Cognitive impairments: No apparent impairments                         Following commands: Intact       Cueing Cueing Techniques: Verbal cues, Tactile cues     General Comments      Exercises     Assessment/Plan    PT Assessment Patient needs continued PT services  PT Problem List Decreased strength;Decreased range of motion;Decreased activity tolerance;Decreased balance;Decreased mobility;Decreased knowledge of use of DME       PT Treatment Interventions DME instruction;Balance training;Gait training;Neuromuscular re-education;Stair training;Functional mobility training;Patient/family education;Wheelchair mobility training;Therapeutic exercise;Therapeutic activities    PT Goals (Current goals can be found in the Care Plan section)  Acute Rehab PT Goals Patient Stated Goal: to get stronger, go back to rehab PT Goal Formulation: With patient Time For Goal Achievement: 01/22/24 Potential to Achieve Goals: Good    Frequency Min 2X/week     Co-evaluation               AM-PAC PT "6 Clicks" Mobility  Outcome Measure Help needed turning from your back  to your side while in a flat bed without using bedrails?: A Lot Help needed moving from lying on your back to sitting on the side of a flat bed without using bedrails?: A Lot Help needed moving to and from a bed to a chair (including a wheelchair)?: A Lot Help needed standing up from a chair using your arms (e.g., wheelchair or bedside chair)?: A Lot Help needed to walk in hospital room?: Total Help needed climbing 3-5 steps with a railing? : Total 6 Click Score: 10    End of Session Equipment Utilized During Treatment: Gait belt Activity Tolerance: Patient tolerated treatment well Patient left: in bed;with call bell/phone within reach;with bed alarm set Nurse Communication: Mobility status PT Visit Diagnosis: Other abnormalities of gait and mobility (R26.89);Difficulty in walking, not elsewhere classified (R26.2);Muscle weakness (generalized) (M62.81)    Time: 1610-9604 PT Time Calculation (min) (ACUTE ONLY): 27 min   Charges:   PT Evaluation $PT Eval Low Complexity: 1 Low PT Treatments $Therapeutic Activity: 8-22 mins PT General Charges $$ ACUTE PT VISIT: 1 Visit  Darien Eden PT, DPT 3:26 PM,01/08/24

## 2024-01-08 NOTE — ED Notes (Signed)
 Changed patients depends, applied new brief and chux, linen changed.

## 2024-01-08 NOTE — ED Provider Notes (Signed)
-----------------------------------------   5:56 AM on 01/08/2024 -----------------------------------------   Blood pressure (!) 147/75, pulse 64, temperature 98 F (36.7 C), temperature source Oral, resp. rate 13, SpO2 100%.  The patient is calm and cooperative at this time.  There have been no acute events since the last update.  Awaiting disposition plan from Social Work team.   Cashus Halterman J, MD 01/08/24 [phone]

## 2024-01-09 NOTE — ED Notes (Addendum)
 Pt hit call bell at this time needing to be changed. Pt voided in brief. Brief changed. Chux pads changed. Peri-care preformed. Pt given warm blankets. Pt has hx of previous stroke with left sided deficits. Pt able to roll without difficulty. Pt is a x1 assist.

## 2024-01-09 NOTE — ED Provider Notes (Signed)
-----------------------------------------   3:57 AM on 01/09/2024 -----------------------------------------   Blood pressure (!) 176/97, pulse (!) 59, temperature 98.1 F (36.7 C), temperature source Oral, resp. rate 18, SpO2 99%.  The patient is calm and cooperative at this time.  There have been no acute events since the last update.  Awaiting disposition plan from case management/social work.    Sumaya Riedesel, Clover Dao, DO 01/09/24 [phone]

## 2024-01-09 NOTE — ED Notes (Signed)
 Pt called out to be changed, this tech changed pt's brief and changed chuck under bed. EDT Daphne Eagles helped this EDT pull pt up in the bed. Pt has warm blankets now and has no other needs at the moment.

## 2024-01-09 NOTE — Evaluation (Signed)
 Occupational Therapy Evaluation Patient Details Name: Corey Howard MRN: [id number] DOB: 1951-07-06 Today's Date: 01/09/2024   History of Present Illness   Pt is a 73 yo male that presented to ED after a wellness check, difficulty caring for himself. Pt recently at SNF due to a CVA in March. PMH of atrial flutter, DVT, DM, HTN, CKD, Bell's Palsy.     Clinical Impressions Corey Howard was seen for OT evaluation this date. Prior to hospital admission, pt was recently using w/c after return from [facility]. Pt lives alone. Pt currently requires MOD A exit bed, MIN A return to bed x3 trials. Poor sitting balance. MOD A sit<>stand x3 trials from elevated stretcher stood on ~3" aerobic step for improved grip - assist to maintain LLE on step. LLE/LUE ataxia noted and limiting formal testing, grossly 3/5. Pt would benefit from skilled OT to address noted impairments and functional limitations (see below for any additional details). Upon hospital discharge, recommend OT follow up.      If plan is discharge home, recommend the following:   A lot of help with bathing/dressing/bathroom;Two people to help with walking and/or transfers     Functional Status Assessment   Patient has had a recent decline in their functional status and demonstrates the ability to make significant improvements in function in a reasonable and predictable amount of time.     Equipment Recommendations   Other (comment) (defer)     Recommendations for Other Services         Precautions/Restrictions   Precautions Precautions: Fall Recall of Precautions/Restrictions: Intact Restrictions Weight Bearing Restrictions Per Provider Order: No     Mobility Bed Mobility Overal bed mobility: Needs Assistance Bed Mobility: Supine to Sit, Sit to Supine     Supine to sit: Mod assist Sit to supine: Min assist        Transfers Overall transfer level: Needs assistance Equipment used: Rolling walker (2  wheels) Transfers: Sit to/from Stand Sit to Stand: Mod assist           General transfer comment: from elevated stretcher stood on ~3" aerobic step for improved grip - assist to maintain LLE on step      Balance Overall balance assessment: Needs assistance Sitting-balance support: Feet supported, Bilateral upper extremity supported Sitting balance-Leahy Scale: Poor   Postural control: Posterior lean Standing balance support: Bilateral upper extremity supported, During functional activity, Reliant on assistive device for balance Standing balance-Leahy Scale: Poor                             ADL either performed or assessed with clinical judgement   ADL Overall ADL's : Needs assistance/impaired                                       General ADL Comments: MAX A don B socks at bed level     Vision         Perception         Praxis         Pertinent Vitals/Pain Pain Assessment Pain Assessment: No/denies pain     Extremity/Trunk Assessment Upper Extremity Assessment Upper Extremity Assessment: LUE deficits/detail LUE Deficits / Details: ataxia noted and limiting formal testing, grossly 3/5   Lower Extremity Assessment Lower Extremity Assessment: LLE deficits/detail LLE Deficits / Details: ataxia noted, grossly 3/5  Communication Communication Communication: No apparent difficulties   Cognition Arousal: Alert Behavior During Therapy: WFL for tasks assessed/performed Cognition: No apparent impairments                               Following commands: Intact       Cueing  General Comments   Cueing Techniques: Verbal cues;Tactile cues      Exercises     Shoulder Instructions      Home Living Family/patient expects to be discharged to:: Private residence Living Arrangements: Alone Available Help at Discharge: Family;Available PRN/intermittently Type of Home: House Home Access: Ramped entrance      Home Layout: One level     Bathroom Shower/Tub: Tub/shower unit         Home Equipment: Agricultural consultant (2 wheels);Cane - single point;Wheelchair - manual;Shower seat;Grab bars - tub/shower   Additional Comments: reports roommate no longer available      Prior Functioning/Environment Prior Level of Function : Needs assist             Mobility Comments: w/c since returning home, reports ambulatory at [facility]      OT Problem List: Decreased strength;Decreased range of motion;Decreased activity tolerance;Impaired balance (sitting and/or standing);Decreased coordination;Decreased safety awareness   OT Treatment/Interventions: Self-care/ADL training;Therapeutic exercise;Energy conservation;DME and/or AE instruction;Therapeutic activities      OT Goals(Current goals can be found in the care plan section)   Acute Rehab OT Goals Patient Stated Goal: to go home OT Goal Formulation: With patient Time For Goal Achievement: 01/23/24 Potential to Achieve Goals: Fair ADL Goals Pt Will Perform Grooming: sitting;with set-up;with supervision Pt Will Perform Lower Body Dressing: with min assist;sitting/lateral leans;sit to/from stand;with adaptive equipment Pt Will Transfer to Toilet: with min assist;stand pivot transfer;bedside commode   OT Frequency:  Min 2X/week    Co-evaluation              AM-PAC OT "6 Clicks" Daily Activity     Outcome Measure Help from another person eating meals?: None Help from another person taking care of personal grooming?: A Little Help from another person toileting, which includes using toliet, bedpan, or urinal?: A Lot Help from another person bathing (including washing, rinsing, drying)?: A Lot Help from another person to put on and taking off regular upper body clothing?: A Lot Help from another person to put on and taking off regular lower body clothing?: A Lot 6 Click Score: 15   End of Session Equipment Utilized During Treatment: Rolling  walker (2 wheels);Gait belt  Activity Tolerance: Patient tolerated treatment well Patient left: in bed;with call bell/phone within reach;with bed alarm set  OT Visit Diagnosis: Other abnormalities of gait and mobility (R26.89);Muscle weakness (generalized) (M62.81)                Time: 1610-9604 OT Time Calculation (min): 18 min Charges:  OT General Charges $OT Visit: 1 Visit OT Evaluation $OT Eval Low Complexity: 1 Low  Gordan Latina, M.S. OTR/L  01/09/24, 1:37 PM  ascom [phone]

## 2024-01-09 NOTE — NC FL2 (Signed)
 [place]  MEDICAID FL2 LEVEL OF CARE FORM     IDENTIFICATION  Patient Name: Corey Howard Birthdate: 30-Apr-1951 Sex: male Admission Date (Current Location): 01/07/2024  [facility] and [place] Number:  Chiropodist and Address:  [facility], [address] [place], [place] [address]      Provider Number: [id number]  Attending Physician Name and Address:  No att. providers found  Relative Name and Phone Number:  [name] [phone] [name] [phone]    Current Level of Care: Hospital Recommended Level of Care: Skilled Nursing Facility Prior Approval Number:    Date Approved/Denied:   PASRR Number: [id number]  Discharge Plan: SNF    Current Diagnoses: Patient Active Problem List   Diagnosis Date Noted   Acquired thrombophilia (HCC) 06/08/2023   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   Neuropathy 03/04/2023   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   History of Bell's palsy    Tobacco abuse 01/27/2017   Seborrhea 10/01/2015   Type II diabetes mellitus with complication (HCC) 03/16/2015   Essential hypertension 03/16/2015    Orientation RESPIRATION BLADDER Height & Weight     Self, Time, Situation, Place  Normal Incontinent Weight:   Height:     BEHAVIORAL SYMPTOMS/MOOD NEUROLOGICAL BOWEL NUTRITION STATUS     (Hx of stroke as recent as 60 days ago.) Incontinent Diet  AMBULATORY STATUS COMMUNICATION OF NEEDS Skin   Limited Assist Verbally Normal                       Personal Care Assistance Level of Assistance  Bathing, Feeding, Dressing Bathing Assistance: Limited assistance Feeding assistance: Limited assistance Dressing Assistance: Limited assistance     Functional Limitations Info             SPECIAL CARE FACTORS FREQUENCY  PT (By licensed PT)     PT Frequency: 5x              Contractures Contractures Info: Not present    Additional Factors Info   Allergies (Code status not of file)   Allergies Info: Penicillin G           Current Medications (01/09/2024):  This is the current hospital active medication list Current Facility-Administered Medications  Medication Dose Route Frequency Provider Last Rate Last Admin   apixaban  (ELIQUIS ) tablet 5 mg  5 mg Oral BID Tan, Ting Xu, MD   5 mg at 01/08/24 2202   atorvastatin  (LIPITOR) tablet 20 mg  20 mg Oral Daily Tan, Ting Xu, MD   20 mg at 01/08/24 1344   empagliflozin  (JARDIANCE ) tablet 10 mg  10 mg Oral Daily Tan, Ting Xu, MD   10 mg at 01/08/24 1344   glipiZIDE  (GLUCOTROL  XL) 24 hr tablet 2.5 mg  2.5 mg Oral Q breakfast Tan, Ting Xu, MD       lisinopril  (ZESTRIL ) tablet 40 mg  40 mg Oral Daily Tan, Ting Xu, MD   40 mg at 01/08/24 1343   Current Outpatient Medications  Medication Sig Dispense Refill   amLODipine (NORVASC) 5 MG tablet Take 1 tablet by mouth daily.     apixaban  (ELIQUIS ) 5 MG TABS tablet Take 1 tablet (5 mg total) by mouth 2 (two) times daily. 180 tablet 1   atorvastatin  (LIPITOR) 20 MG tablet Take 1 tablet by mouth daily.     B Complex Vitamins (VITAMIN B-COMPLEX) TABS Take 1 tablet by mouth daily.     cyanocobalamin  1000 MCG tablet Take by mouth daily.     empagliflozin  (JARDIANCE ) 10 MG TABS tablet Take 10 mg by mouth daily.     glipiZIDE  (GLUCOTROL  XL) 2.5 MG 24 hr tablet Take 2.5 mg by mouth daily with breakfast.     lisinopril  (ZESTRIL ) 40 MG tablet Take 40 mg by mouth daily.     Blood Glucose Monitoring Suppl (FIFTY50 GLUCOSE METER 2.0) w/Device KIT See admin instructions. (Patient not taking: Reported on 09/23/2023)     glucose blood (ONETOUCH ULTRA) test strip by XX route once daily       Discharge Medications: Please see discharge summary for a list of discharge medications.  Relevant Imaging Results:  Relevant Lab Results:   Additional Information [id number]  [name], LCSW

## 2024-01-10 NOTE — ED Notes (Signed)
 Breakfast tray delivered and set up by dining.

## 2024-01-10 NOTE — ED Notes (Signed)
 Lunch tray disposed of. Urinal emptied. Pt has no needs at this time.

## 2024-01-10 NOTE — ED Notes (Signed)
 FALL BUNDLE IS CURRENTLY IN PLACE

## 2024-01-10 NOTE — ED Notes (Signed)
 Pt stating he had a VM, pt cleaned of stool, new brief and chuck pad placed. New warm blankets placed.

## 2024-01-10 NOTE — ED Notes (Signed)
 Pt given dinner tray by dining. Pt sat up and tray prepared by this RN. Pt has no other needs at this time.

## 2024-01-10 NOTE — ED Notes (Signed)
 Pt stating need to be changed due to incontinence. Pt cleaned of urine, new brief, gown, and chuck pad placed. Pt given warm blankets.

## 2024-01-10 NOTE — ED Notes (Addendum)
 Pt called out to be changed, this NT changed pt into dry brief, gown, and new linens. Pt is now resting comfortably in bed.

## 2024-01-10 NOTE — ED Notes (Signed)
 Lunch tray delivered. Pt sat up in bed and tray set up for pt to eat.

## 2024-01-10 NOTE — ED Provider Notes (Signed)
-----------------------------------------   7:04 AM on 01/10/2024 -----------------------------------------   Blood pressure (!) 164/89, pulse (!) 59, temperature 98.4 F (36.9 C), temperature source Oral, resp. rate 18, SpO2 98%.  The patient is calm and cooperative at this time.  There have been no acute events since the last update.  Awaiting disposition plan from Social Work team.   Zebulon Gantt J, MD 01/10/24 [phone]

## 2024-01-10 NOTE — ED Notes (Signed)
 Pt asking to be moved up in bed and would like ginger ale. Pt repositioned and covered with blankets. Ginger ale provided.

## 2024-01-11 NOTE — ED Provider Notes (Signed)
-----------------------------------------   5:56 AM on 01/11/2024 -----------------------------------------   Blood pressure (!) 130/108, pulse 60, temperature 98.1 F (36.7 C), temperature source Oral, resp. rate 18, SpO2 96%.  The patient is calm and cooperative at this time.  There have been no acute events since the last update.  Awaiting disposition plan from Social Work team.    Lind Repine, MD 01/11/24 [phone]

## 2024-01-11 NOTE — TOC Progression Note (Addendum)
 Transition of Care [facility]) - Progression Note    Patient Details  Name: Corey Howard MRN: [id number] Date of Birth: 1951-07-28  [facility] CM/SW Contact  [place] L Zari Cly, [place] Phone Number: 01/11/2024, 10:30 AM  Clinical Narrative:     CSW spoke with pt. CSW presented offers from [facility] and [facility]. Pt stated that he has the money for [facility]. Pts brother, Pete Brand was contacted while CSW was with pt. Pete Brand is insisting that his brother go to a facility. CSW explained that per [employer], he could return for a LTC bed however, pt does not want that. There is a matter of finances as well. CSW also explained that there will be a copay until the LTC medicaid is approved. Pete Brand advised that pt does not have the funds. He stated that it is not safe for his brother to return home. CSW inquired about what makes the home unsafe. Pete Brand stated that pt would be alone and he was sitting in his own filth. CSW expressed empathy however, CSW advised that [facility] can assist pt with ADL and ensuring that he is clean and fed. Pete Brand and other relatives reside in [place] and they can not assist. CSW explained that patient can not remain in the ED when it is not medically necessary for him to be here. Pete Brand wanted an opportunity to speak with pt about LTC. CSW ended the call so that Pete Brand can have a private conversation with patient.    CSW confirmed with patient that he does not want LTC. Pt declined and stated that his brothers have his best interest but they "meddle". Pt is fine with CSW contacting [facility] to set up PT/OT services.     Expected Discharge Plan and Services In-house Referral: Clinical Social Work                                             Social Determinants of Health (SDOH) Interventions SDOH Screenings   Food Insecurity: No Food Insecurity (10/23/2023)   [facility]  Transportation Needs: No Transportation Needs (10/23/2023)   Received from  [facility]  Depression [id number]): Low Risk  (09/23/2023)  Financial Resource Strain: Low Risk  (10/23/2023)   Received from [facility]  Tobacco Use: High Risk (01/07/2024)    Readmission Risk Interventions     No data to display

## 2024-01-11 NOTE — ED Notes (Signed)
 MD notified of BP 107/56. BP repeated 3x with similar results. Holding Lisinopril .

## 2024-01-11 NOTE — TOC Progression Note (Signed)
 Transition of Care [facility]) - Progression Note    Patient Details  Name: Corey Howard MRN: [id number] Date of Birth: 01/19/1951  Transition [facility] Care [facility]) CM/SW Contact  [place] L Andrw [place], [place] Phone Number: 01/11/2024, 8:57 AM  Clinical Narrative:     CSW sent FL2 to several SNF for consideration. Awaiting response from [facility] to determine if patient can return to facility since he was discharged after a 60 day rehab stay.       Expected Discharge Plan and Services In-house Referral: Clinical Social Work                                             Social Determinants of Health (SDOH) Interventions SDOH Screenings   Food Insecurity: No Food Insecurity (10/23/2023)   Received from [facility]  Transportation Needs: No Transportation Needs (10/23/2023)   Received from [facility]  Depression [phone]): Low Risk  (09/23/2023)  Financial Resource Strain: Low Risk  (10/23/2023)   Received from [facility]  Tobacco Use: High Risk (01/07/2024)    Readmission Risk Interventions     No data to display

## 2024-01-11 NOTE — ED Notes (Signed)
 [facility] called  for  transport home

## 2024-01-11 NOTE — TOC Progression Note (Signed)
 Transition of Care [facility]) - Progression Note    Patient Details  Name: Corey Howard MRN: [id number] Date of Birth: November 29, 1950  Transition of Care [facility]) CM/SW Contact  [place] L Haydee [place], [place] Phone Number: 01/11/2024, 10:50 AM  Clinical Narrative:     CSW received call from [facility], [employer]. Ana advised that pt could return to a LTC bed but this is not what he wants. Ana stated that when pt was at the facility, he was offered a LTC bed but he did not want to give up his assets OZ:HYQM, savings etc.        Expected Discharge Plan and Services In-house Referral: Clinical Social Work                                             Social Determinants of Health (SDOH) Interventions SDOH Screenings   Food Insecurity: No Food Insecurity (10/23/2023)   Received from [facility]  Transportation Needs: No Transportation Needs (10/23/2023)   Received from [facility]  Depression [phone]): Low Risk  (09/23/2023)  Financial Resource Strain: Low Risk  (10/23/2023)   Received from [facility]  Tobacco Use: High Risk (01/07/2024)    Readmission Risk Interventions     No data to display

## 2024-01-11 NOTE — ED Notes (Signed)
Pt assisted with lunch tray.

## 2024-01-11 NOTE — ED Notes (Signed)
 Pt bathed and brushed teeth.

## 2024-01-11 NOTE — TOC Progression Note (Addendum)
 Transition of Care [facility]) - Progression Note    Patient Details  Name: Corey Howard MRN: [id number] Date of Birth: [birth year]  Transition of Care [facility]) CM/SW Contact  [place] L Felicita [place], [place] Phone Number: 01/11/2024, 10:35 AM  Clinical Narrative:     CSW contacted [facility] and spoke to [place]. Shaun advised that he can accept patient and coordinate services with discharge to get services started timely.    CSW checked basket for SNF offers. Pt was declined by [facility] and [facility] due to insurance being out of network.     Expected Discharge Plan and Services In-house Referral: Clinical Social Work                                             Social Determinants of Health (SDOH) Interventions SDOH Screenings   Food Insecurity: No Food Insecurity (10/23/2023)   Received from [facility]  Transportation Needs: No Transportation Needs (10/23/2023)   Received from [facility]  Depression [phone]): Low Risk  (09/23/2023)  Financial Resource Strain: Low Risk  (10/23/2023)   Received from [facility]  Tobacco Use: High Risk (01/07/2024)    Readmission Risk Interventions     No data to display

## 2024-01-11 NOTE — Telephone Encounter (Signed)
 Left VM for patient to call back.  JM

## 2024-01-11 NOTE — ED Provider Notes (Signed)
.-----------------------------------------   10:38 AM on 01/11/2024 -----------------------------------------  Blood pressure (!) 107/56, pulse 67, temperature 98 F (36.7 C), temperature source Oral, resp. rate 17, SpO2 95%.  Patient was evaluated by social work, declined long-term care, SW recommended discharge to include PT OT consult.  He has been medically cleared.  Will put in an order for PT OT and having discharged with outpatient follow-up.          Shane Darling, MD 01/11/24 [phone]

## 2024-01-11 NOTE — Telephone Encounter (Signed)
 I was informed patients number is out of service right now. So next time he calls please get good number to call back.  JM

## 2024-01-13 ENCOUNTER — Ambulatory Visit: Admitting: Internal Medicine

## 2024-01-13 ENCOUNTER — Other Ambulatory Visit: Payer: Self-pay | Admitting: Internal Medicine

## 2024-01-13 DIAGNOSIS — E118 Type 2 diabetes mellitus with unspecified complications: Secondary | ICD-10-CM

## 2024-01-14 NOTE — Telephone Encounter (Signed)
 Requested medications are due for refill today.  unsure  Requested medications are on the active medications list.  yes  Last refill. 11/03/23   Future visit scheduled.   yes  Notes to clinic.  Medication is historical.    Requested Prescriptions  Pending Prescriptions Disp Refills   glipiZIDE  (GLUCOTROL  XL) 2.5 MG 24 hr tablet [Pharmacy Med Name: GLIPIZIDE  ER 2.5 MG TABLET] 90 tablet     Sig: TAKE 1 TABLET BY MOUTH EVERY DAY WITH BREAKFAST     Endocrinology:  Diabetes - Sulfonylureas Failed - 01/14/2024  1:05 PM      Failed - Cr in normal range and within 360 days    Creatinine, Ser  Date Value Ref Range Status  01/07/2024 1.71 (H) 0.61 - 1.24 mg/dL Final   Creatinine, Urine  Date Value Ref Range Status  06/11/2023 184  Final         Passed - HBA1C is between 0 and 7.9 and within 180 days    Hemoglobin A1C  Date Value Ref Range Status  09/23/2023 5.9 (A) 4.0 - 5.6 % Final  01/16/2023 6.3  Final         Passed - Valid encounter within last 6 months    Recent Outpatient Visits           3 months ago Type II diabetes mellitus [facility])   [place] [facility] at [facility], Chales [place], MD       Future Appointments             In 1 week Gala Jubilee, Chales Colorado, MD [facility] at [facility], [facility]

## 2024-01-14 NOTE — Telephone Encounter (Signed)
Please review medication refill  request

## 2024-01-15 ENCOUNTER — Ambulatory Visit: Payer: Self-pay

## 2024-01-15 NOTE — Telephone Encounter (Signed)
 Copied from CRM [id number]. Topic: Clinical - Pink Word Triage >> Jan 15, 2024 11:05 AM Crispin Dolphin wrote: Reason for Triage: Patient called. Went to pick up Eliquis  and it was over $300. Not able to afford that. Would like to know if provider can prescribe something else or what he should do. Was recently discharged from hospital as well. Thank You  Called pt and LM on VM to call back. This encounter was created in error - please disregard.

## 2024-01-15 NOTE — Telephone Encounter (Signed)
 Please review.  KP

## 2024-01-15 NOTE — Telephone Encounter (Signed)
 Copied from CRM [id number]. Topic: Clinical - Pink Word Triage >> Jan 15, 2024 11:05 AM Crispin Dolphin wrote: Reason for Triage: Patient called. Went to pick up Eliquis  and it was over $300. Not able to afford that. Would like to know if provider can prescribe something else or what he should do. Was recently discharged from hospital as well. Thank You  Called pt and LM on VM to call back.

## 2024-01-15 NOTE — Telephone Encounter (Signed)
 Called pt back. LM on VM. Advised will forward to his provider.  Reason for Disposition . [1] Follow-up call to recent contact AND [2] information only call, no triage required  Protocols used: Information Only Call - No Triage-A-AH

## 2024-01-17 DIAGNOSIS — I129 Hypertensive chronic kidney disease with stage 1 through stage 4 chronic kidney disease, or unspecified chronic kidney disease: Secondary | ICD-10-CM | POA: Diagnosis not present

## 2024-01-17 DIAGNOSIS — I700 Atherosclerosis of aorta: Secondary | ICD-10-CM | POA: Diagnosis not present

## 2024-01-17 DIAGNOSIS — F17210 Nicotine dependence, cigarettes, uncomplicated: Secondary | ICD-10-CM | POA: Diagnosis not present

## 2024-01-17 DIAGNOSIS — E1140 Type 2 diabetes mellitus with diabetic neuropathy, unspecified: Secondary | ICD-10-CM | POA: Diagnosis not present

## 2024-01-17 DIAGNOSIS — I10 Essential (primary) hypertension: Secondary | ICD-10-CM | POA: Diagnosis not present

## 2024-01-17 DIAGNOSIS — I69354 Hemiplegia and hemiparesis following cerebral infarction affecting left non-dominant side: Secondary | ICD-10-CM | POA: Diagnosis not present

## 2024-01-17 DIAGNOSIS — I480 Paroxysmal atrial fibrillation: Secondary | ICD-10-CM | POA: Diagnosis not present

## 2024-01-17 DIAGNOSIS — E1122 Type 2 diabetes mellitus with diabetic chronic kidney disease: Secondary | ICD-10-CM | POA: Diagnosis not present

## 2024-01-17 DIAGNOSIS — E213 Hyperparathyroidism, unspecified: Secondary | ICD-10-CM | POA: Diagnosis not present

## 2024-01-17 DIAGNOSIS — E785 Hyperlipidemia, unspecified: Secondary | ICD-10-CM | POA: Diagnosis not present

## 2024-01-17 DIAGNOSIS — I69398 Other sequelae of cerebral infarction: Secondary | ICD-10-CM | POA: Diagnosis not present

## 2024-01-17 DIAGNOSIS — N1832 Chronic kidney disease, stage 3b: Secondary | ICD-10-CM | POA: Diagnosis not present

## 2024-01-17 DIAGNOSIS — Z7982 Long term (current) use of aspirin: Secondary | ICD-10-CM | POA: Diagnosis not present

## 2024-01-17 DIAGNOSIS — D6861 Antiphospholipid syndrome: Secondary | ICD-10-CM | POA: Diagnosis not present

## 2024-01-18 ENCOUNTER — Telehealth: Payer: Self-pay

## 2024-01-18 ENCOUNTER — Other Ambulatory Visit: Payer: Self-pay

## 2024-01-18 DIAGNOSIS — I639 Cerebral infarction, unspecified: Secondary | ICD-10-CM

## 2024-01-18 NOTE — Telephone Encounter (Signed)
 Called Corey Howard gave verbal. Please read the message at the end about medication.  KP  Copied from CRM [id number]. Topic: Clinical - Home Health Verbal Orders >> Jan 18, 2024 12:19 PM Crispin Dolphin wrote: Caller/Agency: [facility] Callback Number: [phone] ok to leave msg secure vm  Service Requested: Physical Therapy Frequency: 2 week 8, 1 week 1  Any new concerns about the patient? Yes - supposed to be taking Eliquis . Not taking it taking  Baby Asprin twice per day. Wanted to know if this is ok or what can be done. Thank You

## 2024-01-18 NOTE — Telephone Encounter (Signed)
 Called pt left VM stating aspirin  is not ok in place of eliquis . We sent in a referral for VBCI for pt.  KP

## 2024-01-18 NOTE — Telephone Encounter (Signed)
 Referral placed for Eliquis  assistance post stroke.

## 2024-01-19 ENCOUNTER — Telehealth: Payer: Self-pay

## 2024-01-19 NOTE — Progress Notes (Signed)
 Care Guide Pharmacy Note  01/19/2024 Name: Corey Howard MRN: [id number] DOB: August 17, 1950  Referred By: Sheron Dixons, MD Reason for referral: Complex Care Management (Outreach to schedule with Pharm d)   Corey Howard is a 73 y.o. year old male who is a primary care patient of Sheron Dixons, MD.  [name] was referred to the pharmacist for assistance related to: CVA  An unsuccessful telephone outreach was attempted today to contact the patient who was referred to the pharmacy team for assistance with medication assistance. Additional attempts will be made to contact the patient.  [name] , RMA     [facility]  [facility], [facility] Guide  Direct Dial: [phone]  Website: [name]

## 2024-01-22 DIAGNOSIS — I10 Essential (primary) hypertension: Secondary | ICD-10-CM | POA: Diagnosis not present

## 2024-01-22 DIAGNOSIS — I69354 Hemiplegia and hemiparesis following cerebral infarction affecting left non-dominant side: Secondary | ICD-10-CM | POA: Diagnosis not present

## 2024-01-25 ENCOUNTER — Encounter: Payer: No Typology Code available for payment source | Admitting: Internal Medicine

## 2024-01-25 DIAGNOSIS — I700 Atherosclerosis of aorta: Secondary | ICD-10-CM | POA: Insufficient documentation

## 2024-01-25 NOTE — Assessment & Plan Note (Deleted)
 On DOAC for life; no bleeding issues and no recurrent DVT

## 2024-01-25 NOTE — Assessment & Plan Note (Deleted)
On appropriate statin therapy 

## 2024-01-25 NOTE — Progress Notes (Deleted)
 Date:  01/25/2024   Name:  Corey Howard   DOB:  1951/06/25   MRN:  [id number]   Chief Complaint: No chief complaint on file. Corey Howard is a 73 y.o. male who presents today for his Complete Annual Exam. He feels {DESC; WELL/FAIRLY WELL/POORLY:18703}. He reports exercising ***. He reports he is sleeping {DESC; WELL/FAIRLY WELL/POORLY:18703}.   Health Maintenance  Topic Date Due   Medicare Annual Wellness Visit  Never done   DTaP/Tdap/Td vaccine (1 - Tdap) Never done   Zoster (Shingles) Vaccine (1 of 2) Never done   COVID-19 Vaccine (3 - Pfizer risk series) 11/20/2019   Colon Cancer Screening  09/30/2025*   Flu Shot  03/11/2024   Hemoglobin A1C  03/22/2024   Eye exam for diabetics  04/19/2024   Complete foot exam   06/07/2024   Screening for Lung Cancer  06/08/2024   Yearly kidney health urinalysis for diabetes  06/10/2024   Yearly kidney function blood test for diabetes  01/06/2025   Pneumococcal Vaccine for age over 80  Completed   Hepatitis C Screening  Completed   HPV Vaccine  Aged Out   Meningitis B Vaccine  Aged Out  *Topic was postponed. The date shown is not the original due date.    No results found for: PSA1, PSA   Hypertension This is a chronic problem. The problem is controlled. Pertinent negatives include no chest pain, headaches, palpitations or shortness of breath. Past treatments include ACE inhibitors and calcium  channel blockers. The current treatment provides significant improvement. Hypertensive end-organ damage includes kidney disease. There is no history of CAD/MI or CVA.  Diabetes He presents for his follow-up diabetic visit. He has type 2 diabetes mellitus. His disease course has been stable. Pertinent negatives for hypoglycemia include no dizziness or headaches. Pertinent negatives for diabetes include no chest pain, no fatigue and no weakness. Pertinent negatives for diabetic complications include no CVA. Current diabetic treatments: Jardiance   and glipizide .  Hyperlipidemia This is a chronic problem. The problem is controlled. Pertinent negatives include no chest pain or shortness of breath. Current antihyperlipidemic treatment includes statins. The current treatment provides significant improvement of lipids.    Review of Systems  Constitutional:  Negative for fatigue and unexpected weight change.  HENT:  Negative for nosebleeds.   Eyes:  Negative for visual disturbance.  Respiratory:  Negative for cough, chest tightness, shortness of breath and wheezing.   Cardiovascular:  Negative for chest pain, palpitations and leg swelling.  Gastrointestinal:  Negative for abdominal pain, constipation and diarrhea.  Neurological:  Negative for dizziness, weakness, light-headedness and headaches.     Lab Results  Component Value Date   NA 141 01/07/2024   K 3.8 01/07/2024   CO2 24 01/07/2024   GLUCOSE 256 (H) 01/07/2024   BUN 28 (H) 01/07/2024   CREATININE 1.71 (H) 01/07/2024   CALCIUM  9.0 01/07/2024   EGFR 40 06/11/2023   GFRNONAA 43 (L) 01/07/2024   Lab Results  Component Value Date   CHOL 122 01/16/2023   HDL 41 01/16/2023   LDLCALC 59 01/16/2023   TRIG 108 01/16/2023   CHOLHDL 4.2 10/09/2015   No results found for: TSH Lab Results  Component Value Date   HGBA1C 5.9 (A) 09/23/2023   Lab Results  Component Value Date   WBC 10.5 01/07/2024   HGB 14.3 01/07/2024   HCT 42.6 01/07/2024   MCV 89.7 01/07/2024   PLT 327 01/07/2024   Lab Results  Component Value Date  ALT 24 01/07/2024   AST 23 01/07/2024   ALKPHOS 93 01/07/2024   BILITOT 1.0 01/07/2024   No results found for: Lucetta Russel, VD25OH   Patient Active Problem List   Diagnosis Date Noted   Aortic atherosclerosis (HCC) 01/25/2024   Acquired thrombophilia (HCC) 06/08/2023   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   Neuropathy 03/04/2023   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   History of Bell's palsy     Tobacco abuse 01/27/2017   Seborrhea 10/01/2015   Type II diabetes mellitus with complication (HCC) 03/16/2015   Essential hypertension 03/16/2015    Allergies  Allergen Reactions   Penicillin G Other (See Comments)    Past Surgical History:  Procedure Laterality Date   TONSILLECTOMY      Social History   Tobacco Use   Smoking status: Every Day    Current packs/day: 0.50    Average packs/day: 0.5 packs/day for 88.5 years (44.2 ttl pk-yrs)    Types: Cigarettes    Start date: 1977   Smokeless tobacco: Never  Vaping Use   Vaping status: Never Used  Substance Use Topics   Alcohol use: Not Currently   Drug use: No     Medication list has been reviewed and updated.  No outpatient medications have been marked as taking for the 01/25/24 encounter (Appointment) with Sheron Dixons, MD.       09/23/2023    3:02 PM 06/08/2023    3:03 PM 03/04/2023    1:56 PM  GAD 7 : Generalized Anxiety Score  Nervous, Anxious, on Edge 0 0 0  Control/stop worrying 0 0 0  Worry too much - different things 0 0 0  Trouble relaxing 0 0 0  Restless 0 0 0  Easily annoyed or irritable 0 0 0  Afraid - awful might happen 0 0 0  Total GAD 7 Score 0 0 0  Anxiety Difficulty Not difficult at all Not difficult at all Not difficult at all       09/23/2023    3:02 PM 06/08/2023    3:03 PM 03/04/2023    1:56 PM  Depression screen PHQ 2/9  Decreased Interest 0 0 0  Down, Depressed, Hopeless 0 0 0  PHQ - 2 Score 0 0 0  Altered sleeping 0 0 0  Tired, decreased energy 0 0 1  Change in appetite 0 0 0  Feeling bad or failure about yourself  0 0 0  Trouble concentrating 0 0 0  Moving slowly or fidgety/restless 0 0 0  Suicidal thoughts 0 0 0  PHQ-9 Score 0 0 1  Difficult doing work/chores Not difficult at all Not difficult at all Not difficult at all    BP Readings from Last 3 Encounters:  01/11/24 (!) 107/56  09/23/23 120/62  06/08/23 128/68    Physical Exam Vitals and nursing note  reviewed.  Constitutional:      Appearance: Normal appearance. He is well-developed.  HENT:     Head: Normocephalic.     Right Ear: Tympanic membrane, ear canal and external ear normal.     Left Ear: Tympanic membrane, ear canal and external ear normal.     Nose: Nose normal.   Eyes:     Conjunctiva/sclera: Conjunctivae normal.     Pupils: Pupils are equal, round, and reactive to light.   Neck:     Thyroid: No thyromegaly.     Vascular: No carotid bruit.   Cardiovascular:     Rate  and Rhythm: Normal rate and regular rhythm.     Heart sounds: Normal heart sounds.  Pulmonary:     Effort: Pulmonary effort is normal.     Breath sounds: Normal breath sounds. No wheezing.  Chest:  Breasts:    Right: No mass.     Left: No mass.  Abdominal:     General: Bowel sounds are normal.     Palpations: Abdomen is soft.     Tenderness: There is no abdominal tenderness.   Musculoskeletal:        General: Normal range of motion.     Cervical back: Normal range of motion and neck supple.  Lymphadenopathy:     Cervical: No cervical adenopathy.   Skin:    General: Skin is warm and dry.   Neurological:     Mental Status: He is alert and oriented to person, place, and time.     Deep Tendon Reflexes: Reflexes are normal and symmetric.   Psychiatric:        Attention and Perception: Attention normal.        Mood and Affect: Mood normal.        Thought Content: Thought content normal.     Wt Readings from Last 3 Encounters:  09/23/23 201 lb 1.6 oz (91.2 kg)  06/09/23 209 lb (94.8 kg)  06/08/23 209 lb 3.2 oz (94.9 kg)    There were no vitals taken for this visit.  Assessment and Plan:  Problem List Items Addressed This Visit       Unprioritized   Type II diabetes mellitus with complication (HCC)   Blood sugars have been stable.  No recent hypoglycemic events requiring assistance. Currently medications are Jardiance  and glipizide . Lab Results  Component Value Date   HGBA1C  5.9 (A) 09/23/2023   Last visit no changes were made.       Essential hypertension   Blood pressure is well controlled.  Current medications are lisinopril  and amlodipine. Will continue same regimen along with efforts to limit dietary sodium.       Tobacco abuse   Participating in LDCT screening.  Last done in November 2024.      Antiphospholipid antibody syndrome (HCC)   On DOAC for life; no bleeding issues and no recurrent DVT      Stage 3b chronic kidney disease (CKD) (HCC)   GFR has been stable in the 40's Will continue to monitor every 6 months.      Acquired thrombophilia (HCC)   Aortic atherosclerosis (HCC)   On appropriate statin therapy      Other Visit Diagnoses       Annual physical exam    -  Primary     Long term current use of oral hypoglycemic drug           No follow-ups on file.    Sheron Dixons, MD [facility] [place]

## 2024-01-25 NOTE — Assessment & Plan Note (Deleted)
 Blood pressure is well controlled.  Current medications are lisinopril  and amlodipine. Will continue same regimen along with efforts to limit dietary sodium.

## 2024-01-25 NOTE — Assessment & Plan Note (Deleted)
 Participating in LDCT screening.  Last done in November 2024.

## 2024-01-25 NOTE — Assessment & Plan Note (Deleted)
 GFR has been stable in the 40's Will continue to monitor every 6 months.

## 2024-01-25 NOTE — Assessment & Plan Note (Deleted)
 Blood sugars have been stable.  No recent hypoglycemic events requiring assistance. Currently medications are Jardiance  and glipizide . Lab Results  Component Value Date   HGBA1C 5.9 (A) 09/23/2023   Last visit no changes were made.

## 2024-01-26 ENCOUNTER — Telehealth: Payer: Self-pay | Admitting: Internal Medicine

## 2024-01-26 ENCOUNTER — Other Ambulatory Visit: Payer: Self-pay

## 2024-01-26 DIAGNOSIS — I639 Cerebral infarction, unspecified: Secondary | ICD-10-CM

## 2024-01-26 NOTE — Telephone Encounter (Signed)
 Left VM informing we cannot order this without seeing pt in office to discuss. VBCI referral placed for transportation for patient.  - Laquetta Racey M.

## 2024-01-26 NOTE — Telephone Encounter (Unsigned)
 Copied from CRM [id number]. Topic: General - Transportation >> Jan 26, 2024 10:16 AM Donee H wrote: Reason for CRM: Patient is requesting transportation to appointment for tomorrow June 18 at 3:40pm. Patient states in a wheelchair. Callback number [phone]

## 2024-01-26 NOTE — Telephone Encounter (Signed)
 VBCI referral placed for transportation. Patient informed on VM.

## 2024-01-26 NOTE — Telephone Encounter (Unsigned)
 Copied from CRM [id number]. Topic: General - Other >> Jan 26, 2024 10:24 AM Donee H wrote: Reason for CRM: Patient wants to place a request for hospital bed. He stated was told to reach out to his doctor from PT. Callback number [phone]

## 2024-01-27 ENCOUNTER — Telehealth: Payer: Self-pay | Admitting: *Deleted

## 2024-01-27 ENCOUNTER — Ambulatory Visit (INDEPENDENT_AMBULATORY_CARE_PROVIDER_SITE_OTHER): Admitting: Internal Medicine

## 2024-01-27 ENCOUNTER — Encounter: Payer: Self-pay | Admitting: Internal Medicine

## 2024-01-27 VITALS — BP 120/74 | HR 96 | Ht 73.0 in | Wt 184.0 lb

## 2024-01-27 DIAGNOSIS — E118 Type 2 diabetes mellitus with unspecified complications: Secondary | ICD-10-CM | POA: Diagnosis not present

## 2024-01-27 DIAGNOSIS — R262 Difficulty in walking, not elsewhere classified: Secondary | ICD-10-CM | POA: Diagnosis not present

## 2024-01-27 DIAGNOSIS — Z7984 Long term (current) use of oral hypoglycemic drugs: Secondary | ICD-10-CM

## 2024-01-27 DIAGNOSIS — I6529 Occlusion and stenosis of unspecified carotid artery: Secondary | ICD-10-CM | POA: Insufficient documentation

## 2024-01-27 DIAGNOSIS — D6861 Antiphospholipid syndrome: Secondary | ICD-10-CM | POA: Diagnosis not present

## 2024-01-27 DIAGNOSIS — I10 Essential (primary) hypertension: Secondary | ICD-10-CM | POA: Diagnosis not present

## 2024-01-27 DIAGNOSIS — I480 Paroxysmal atrial fibrillation: Secondary | ICD-10-CM

## 2024-01-27 DIAGNOSIS — I69998 Other sequelae following unspecified cerebrovascular disease: Secondary | ICD-10-CM | POA: Insufficient documentation

## 2024-01-27 DIAGNOSIS — I6523 Occlusion and stenosis of bilateral carotid arteries: Secondary | ICD-10-CM | POA: Diagnosis not present

## 2024-01-27 DIAGNOSIS — R531 Weakness: Secondary | ICD-10-CM

## 2024-01-27 DIAGNOSIS — D6869 Other thrombophilia: Secondary | ICD-10-CM | POA: Diagnosis not present

## 2024-01-27 DIAGNOSIS — Z8673 Personal history of transient ischemic attack (TIA), and cerebral infarction without residual deficits: Secondary | ICD-10-CM | POA: Diagnosis not present

## 2024-01-27 MED ORDER — APIXABAN 5 MG PO TABS: 5.0000 mg | ORAL_TABLET | Freq: Two times a day (BID) | ORAL | 1 refills | Status: AC

## 2024-01-27 NOTE — Assessment & Plan Note (Signed)
 Continue working with PT and OT Rx for electric hospital bed given to aid independence in transfers

## 2024-01-27 NOTE — Assessment & Plan Note (Signed)
 Will refer to VS on return visit in 3 months. Continue eliquis 

## 2024-01-27 NOTE — Assessment & Plan Note (Signed)
 He is alert and conversant, oriented. Appears to be much improved.

## 2024-01-27 NOTE — Assessment & Plan Note (Signed)
 Blood sugars have been stable.  No recent hypoglycemic events requiring assistance. Currently medications are Jardiance  and glipizide . Lab Results  Component Value Date   HGBA1C 6.0 10/22/2023   Last visit no changes were made.

## 2024-01-27 NOTE — Assessment & Plan Note (Signed)
 On Eliquis  for life. Recent stroke noted

## 2024-01-27 NOTE — Assessment & Plan Note (Addendum)
 Will need Cardiology referral on return SR on exam today

## 2024-01-27 NOTE — Progress Notes (Signed)
 Complex Care Management Note Care Guide Note  01/27/2024 Name: Corey Howard MRN: [id number] DOB: [birth year]   Complex Care Management Outreach Attempts: An unsuccessful telephone outreach was attempted today to offer the patient information about available complex care management services.  Follow Up Plan:  Additional outreach attempts will be made to offer the patient complex care management information and services.   Encounter Outcome:  No Answer  [facility] Care Guide  Direct Dial:[phone] Fax:[phone] Website: [email or web address]

## 2024-01-27 NOTE — Assessment & Plan Note (Addendum)
 Blood pressure is well controlled.  Current medications lisinopril , amlodipine added at time of CVA. He will check at home to make sure he is taking this. Will continue same regimen along with efforts to limit dietary sodium.

## 2024-01-27 NOTE — Assessment & Plan Note (Signed)
 Not affecting speech or oral intake

## 2024-01-27 NOTE — Progress Notes (Signed)
 Date:  01/27/2024   Name:  Corey Howard   DOB:  1950-10-06   MRN:  [id number]   Chief Complaint: Cerebrovascular Accident (Patient is here today with his caretaker Neveah. Follow up post stroke. Patient would like an order for a hospital bed.)  Hypertension This is a chronic problem. The problem is controlled. Pertinent negatives include no chest pain, headaches or shortness of breath. Past treatments include ACE inhibitors (and may be on amlodipine from Rehab). The current treatment provides significant improvement. Hypertensive end-organ damage includes CVA. There is no history of kidney disease or CAD/MI.  Diabetes He presents for his follow-up diabetic visit. He has type 2 diabetes mellitus. His disease course has been stable. Pertinent negatives for hypoglycemia include no dizziness, headaches or nervousness/anxiousness. Associated symptoms include weakness. Pertinent negatives for diabetes include no chest pain and no fatigue. Diabetic complications include a CVA. Current diabetic treatments: Jardiance  and glipizide .  Stroke - he had a stroke in march, admitted to [facility] the spent 2 months in Rehab.  Home now with home health and a caregiver.  He is working with PT and OT.  Needs hospital bed due to low bed and limited transfer ability.  Left facial, left arm and left leg weakness. No swallowing issues. On Eliquis  without bleeding.  Review of Systems  Constitutional:  Negative for chills, fatigue and fever.  HENT:  Negative for trouble swallowing.   Respiratory:  Negative for chest tightness and shortness of breath.   Cardiovascular:  Negative for chest pain and leg swelling.  Gastrointestinal:  Negative for abdominal pain.  Musculoskeletal:  Positive for gait problem.  Skin:  Positive for wound (skin tears). Negative for color change and rash.  Neurological:  Positive for weakness. Negative for dizziness, light-headedness and headaches.  Psychiatric/Behavioral:  Negative for  dysphoric mood and sleep disturbance. The patient is not nervous/anxious.      Lab Results  Component Value Date   NA 141 01/07/2024   K 3.8 01/07/2024   CO2 24 01/07/2024   GLUCOSE 256 (H) 01/07/2024   BUN 28 (H) 01/07/2024   CREATININE 1.71 (H) 01/07/2024   CALCIUM  9.0 01/07/2024   EGFR 40 06/11/2023   GFRNONAA 43 (L) 01/07/2024   Lab Results  Component Value Date   CHOL 122 01/16/2023   HDL 41 01/16/2023   LDLCALC 59 01/16/2023   TRIG 108 01/16/2023   CHOLHDL 4.2 10/09/2015   No results found for: TSH Lab Results  Component Value Date   HGBA1C 6.0 10/22/2023   Lab Results  Component Value Date   WBC 10.5 01/07/2024   HGB 14.3 01/07/2024   HCT 42.6 01/07/2024   MCV 89.7 01/07/2024   PLT 327 01/07/2024   Lab Results  Component Value Date   ALT 24 01/07/2024   AST 23 01/07/2024   ALKPHOS 93 01/07/2024   BILITOT 1.0 01/07/2024   No results found for: Lucetta Russel, VD25OH   Patient Active Problem List   Diagnosis Date Noted   Ambulatory dysfunction 01/27/2024   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) 01/27/2024   Paroxysmal atrial fibrillation (HCC) 01/27/2024   Carotid artery stenosis 01/27/2024   Aortic atherosclerosis (HCC) 01/25/2024   Facial weakness following cerebrovascular accident (CVA) 11/02/2023   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 11/02/2023   Oropharyngeal dysphagia 11/02/2023   Long term current use of anticoagulant therapy 11/02/2023   Acquired thrombophilia (HCC) 06/08/2023   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  Neuropathy 03/04/2023   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   History of Bell's palsy    Tobacco abuse 01/27/2017   Seborrhea 10/01/2015   Type II diabetes mellitus with complication (HCC) 03/16/2015   Essential hypertension 03/16/2015    Allergies  Allergen Reactions   Penicillin G Other (See Comments)    Past Surgical  History:  Procedure Laterality Date   TONSILLECTOMY      Social History   Tobacco Use   Smoking status: Every Day    Current packs/day: 0.25    Average packs/day: 0.4 packs/day for 88.5 years (32.1 ttl pk-yrs)    Types: Cigarettes    Start date: 1977   Smokeless tobacco: Never  Vaping Use   Vaping status: Never Used  Substance Use Topics   Alcohol use: Not Currently   Drug use: No     Medication list has been reviewed and updated.  Current Meds  Medication Sig   atorvastatin  (LIPITOR) 20 MG tablet Take 1 tablet by mouth daily.   B Complex Vitamins (VITAMIN B-COMPLEX) TABS Take 1 tablet by mouth daily.   Blood Glucose Monitoring Suppl (FIFTY50 GLUCOSE METER 2.0) w/Device KIT See admin instructions.   cyanocobalamin 1000 MCG tablet Take by mouth daily.   empagliflozin  (JARDIANCE ) 10 MG TABS tablet Take 10 mg by mouth daily.   glipiZIDE  (GLUCOTROL  XL) 2.5 MG 24 hr tablet TAKE 1 TABLET BY MOUTH EVERY DAY WITH BREAKFAST   glucose blood (ONETOUCH ULTRA) test strip by XX route once daily   lisinopril  (ZESTRIL ) 40 MG tablet Take 40 mg by mouth daily.   [DISCONTINUED] apixaban  (ELIQUIS ) 5 MG TABS tablet Take 1 tablet (5 mg total) by mouth 2 (two) times daily.       01/27/2024    3:28 PM 09/23/2023    3:02 PM 06/08/2023    3:03 PM 03/04/2023    1:56 PM  GAD 7 : Generalized Anxiety Score  Nervous, Anxious, on Edge 0 0 0 0  Control/stop worrying 0 0 0 0  Worry too much - different things 0 0 0 0  Trouble relaxing 0 0 0 0  Restless 0 0 0 0  Easily annoyed or irritable 0 0 0 0  Afraid - awful might happen 0 0 0 0  Total GAD 7 Score 0 0 0 0  Anxiety Difficulty Not difficult at all Not difficult at all Not difficult at all Not difficult at all       01/27/2024    3:28 PM 09/23/2023    3:02 PM 06/08/2023    3:03 PM  Depression screen PHQ 2/9  Decreased Interest 0 0 0  Down, Depressed, Hopeless 0 0 0  PHQ - 2 Score 0 0 0  Altered sleeping 0 0 0  Tired, decreased energy 0 0 0   Change in appetite 0 0 0  Feeling bad or failure about yourself  0 0 0  Trouble concentrating 0 0 0  Moving slowly or fidgety/restless 0 0 0  Suicidal thoughts 0 0 0  PHQ-9 Score 0 0 0  Difficult doing work/chores Not difficult at all Not difficult at all Not difficult at all    BP Readings from Last 3 Encounters:  01/27/24 120/74  01/11/24 (!) 107/56  09/23/23 120/62    Physical Exam Neck:     Vascular: No carotid bruit.   Cardiovascular:     Rate and Rhythm: Normal rate and regular rhythm.     Heart sounds: No murmur  heard. Pulmonary:     Effort: Pulmonary effort is normal.     Breath sounds: No wheezing or rhonchi.   Musculoskeletal:     Right lower leg: No edema.     Left lower leg: No edema.  Lymphadenopathy:     Cervical: No cervical adenopathy.   Neurological:     Mental Status: He is alert.     Cranial Nerves: Facial asymmetry present.     Sensory: Sensation is intact.     Motor: Weakness (of LUE and LLE) present.     Wt Readings from Last 3 Encounters:  01/27/24 184 lb (83.5 kg)  09/23/23 201 lb 1.6 oz (91.2 kg)  06/09/23 209 lb (94.8 kg)    BP 120/74   Pulse 96   Ht 6' 1 (1.854 m)   Wt 184 lb (83.5 kg)   SpO2 100%   BMI 24.28 kg/m   Assessment and Plan:  Problem List Items Addressed This Visit       Unprioritized   Type II diabetes mellitus with complication (HCC) (Chronic)   Blood sugars have been stable.  No recent hypoglycemic events requiring assistance. Currently medications are Jardiance  and glipizide . Lab Results  Component Value Date   HGBA1C 6.0 10/22/2023   Last visit no changes were made.       Essential hypertension - Primary (Chronic)   Blood pressure is well controlled.  Current medications lisinopril , amlodipine added at time of CVA. He will check at home to make sure he is taking this. Will continue same regimen along with efforts to limit dietary sodium.       Relevant Medications   apixaban  (ELIQUIS ) 5 MG  TABS tablet   Antiphospholipid antibody syndrome (HCC) (Chronic)   On Eliquis  for life. 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 Continue eliquis       Relevant Medications   apixaban  (ELIQUIS ) 5 MG TABS tablet   Other Visit Diagnoses       History of ischemic stroke       Relevant Medications   apixaban  (ELIQUIS ) 5 MG TABS tablet   Other Relevant Orders   For home use only DME Hospital bed     Long term current use of oral hypoglycemic drug           Return in about 3 months (around 04/28/2024) for DM, HTN, CVA.    Sheron Dixons, MD [facility] Primary Care and Sports Medicine [place]

## 2024-01-28 ENCOUNTER — Telehealth: Payer: Self-pay | Admitting: *Deleted

## 2024-01-28 DIAGNOSIS — I129 Hypertensive chronic kidney disease with stage 1 through stage 4 chronic kidney disease, or unspecified chronic kidney disease: Secondary | ICD-10-CM | POA: Diagnosis not present

## 2024-01-28 DIAGNOSIS — E1140 Type 2 diabetes mellitus with diabetic neuropathy, unspecified: Secondary | ICD-10-CM | POA: Diagnosis not present

## 2024-01-28 DIAGNOSIS — D6861 Antiphospholipid syndrome: Secondary | ICD-10-CM | POA: Diagnosis not present

## 2024-01-28 DIAGNOSIS — N1832 Chronic kidney disease, stage 3b: Secondary | ICD-10-CM | POA: Diagnosis not present

## 2024-01-28 DIAGNOSIS — E213 Hyperparathyroidism, unspecified: Secondary | ICD-10-CM | POA: Diagnosis not present

## 2024-01-28 DIAGNOSIS — E785 Hyperlipidemia, unspecified: Secondary | ICD-10-CM | POA: Diagnosis not present

## 2024-01-28 DIAGNOSIS — Z7982 Long term (current) use of aspirin: Secondary | ICD-10-CM | POA: Diagnosis not present

## 2024-01-28 DIAGNOSIS — I69354 Hemiplegia and hemiparesis following cerebral infarction affecting left non-dominant side: Secondary | ICD-10-CM | POA: Diagnosis not present

## 2024-01-28 DIAGNOSIS — E1122 Type 2 diabetes mellitus with diabetic chronic kidney disease: Secondary | ICD-10-CM | POA: Diagnosis not present

## 2024-01-28 DIAGNOSIS — I69398 Other sequelae of cerebral infarction: Secondary | ICD-10-CM | POA: Diagnosis not present

## 2024-01-28 DIAGNOSIS — I480 Paroxysmal atrial fibrillation: Secondary | ICD-10-CM | POA: Diagnosis not present

## 2024-01-28 DIAGNOSIS — F17210 Nicotine dependence, cigarettes, uncomplicated: Secondary | ICD-10-CM | POA: Diagnosis not present

## 2024-01-28 DIAGNOSIS — I700 Atherosclerosis of aorta: Secondary | ICD-10-CM | POA: Diagnosis not present

## 2024-01-28 NOTE — Progress Notes (Signed)
 Complex Care Management Note Care Guide Note  01/28/2024 Name: Corey Howard MRN: [id number] DOB: 16-Jul-1951   Complex Care Management Outreach Attempts: An unsuccessful telephone outreach was attempted today to offer the patient information about available complex care management services.  Follow Up Plan:  Additional outreach attempts will be made to offer the patient complex care management information and services.   Encounter Outcome:  No Answer  [facility] Care Guide  Direct [phone] Fax:[phone] Website: [email or web address]

## 2024-01-29 ENCOUNTER — Telehealth: Payer: Self-pay | Admitting: *Deleted

## 2024-01-29 NOTE — Progress Notes (Signed)
 Complex Care Management Note Care Guide Note  01/29/2024 Name: Corey Howard MRN: [id number] DOB: 1951-04-26   Complex Care Management Outreach Attempts: A third unsuccessful outreach was attempted today to offer the patient with information about available complex care management services.  Follow Up Plan:  Additional outreach attempts will be made to offer the patient complex care management information and services.   Encounter Outcome:  No Answer  [facility] Care Guide  Direct Dial:[phone] Fax:[phone] Website: [email or web address]

## 2024-02-01 ENCOUNTER — Telehealth: Payer: Self-pay

## 2024-02-01 DIAGNOSIS — Z7982 Long term (current) use of aspirin: Secondary | ICD-10-CM | POA: Diagnosis not present

## 2024-02-01 DIAGNOSIS — E785 Hyperlipidemia, unspecified: Secondary | ICD-10-CM | POA: Diagnosis not present

## 2024-02-01 DIAGNOSIS — I129 Hypertensive chronic kidney disease with stage 1 through stage 4 chronic kidney disease, or unspecified chronic kidney disease: Secondary | ICD-10-CM | POA: Diagnosis not present

## 2024-02-01 DIAGNOSIS — I69354 Hemiplegia and hemiparesis following cerebral infarction affecting left non-dominant side: Secondary | ICD-10-CM | POA: Diagnosis not present

## 2024-02-01 DIAGNOSIS — E213 Hyperparathyroidism, unspecified: Secondary | ICD-10-CM | POA: Diagnosis not present

## 2024-02-01 DIAGNOSIS — E1122 Type 2 diabetes mellitus with diabetic chronic kidney disease: Secondary | ICD-10-CM | POA: Diagnosis not present

## 2024-02-01 DIAGNOSIS — F17210 Nicotine dependence, cigarettes, uncomplicated: Secondary | ICD-10-CM | POA: Diagnosis not present

## 2024-02-01 DIAGNOSIS — I480 Paroxysmal atrial fibrillation: Secondary | ICD-10-CM | POA: Diagnosis not present

## 2024-02-01 DIAGNOSIS — D6861 Antiphospholipid syndrome: Secondary | ICD-10-CM | POA: Diagnosis not present

## 2024-02-01 DIAGNOSIS — I69398 Other sequelae of cerebral infarction: Secondary | ICD-10-CM | POA: Diagnosis not present

## 2024-02-01 DIAGNOSIS — N1832 Chronic kidney disease, stage 3b: Secondary | ICD-10-CM | POA: Diagnosis not present

## 2024-02-01 DIAGNOSIS — I700 Atherosclerosis of aorta: Secondary | ICD-10-CM | POA: Diagnosis not present

## 2024-02-01 DIAGNOSIS — E1140 Type 2 diabetes mellitus with diabetic neuropathy, unspecified: Secondary | ICD-10-CM | POA: Diagnosis not present

## 2024-02-01 NOTE — Telephone Encounter (Signed)
 Copied from CRM [id number]. Topic: Clinical - Prescription Issue >> Feb 01, 2024 11:00 AM Charlet HERO wrote: Reason for CRM: Laura/ [facility] is wanting to know where the order for the hospital bed [facility] [phone]

## 2024-02-01 NOTE — Telephone Encounter (Signed)
 Spoke with Grayce with [facility]. She will let patient know that he needs to take RX to a medical supply group. He was under the impression we sent it to medical supply group.

## 2024-02-02 ENCOUNTER — Encounter: Payer: Self-pay | Admitting: Internal Medicine

## 2024-02-02 ENCOUNTER — Other Ambulatory Visit (INDEPENDENT_AMBULATORY_CARE_PROVIDER_SITE_OTHER): Admitting: Internal Medicine

## 2024-02-02 DIAGNOSIS — I10 Essential (primary) hypertension: Secondary | ICD-10-CM

## 2024-02-02 DIAGNOSIS — E118 Type 2 diabetes mellitus with unspecified complications: Secondary | ICD-10-CM | POA: Diagnosis not present

## 2024-02-02 DIAGNOSIS — E119 Type 2 diabetes mellitus without complications: Secondary | ICD-10-CM | POA: Insufficient documentation

## 2024-02-02 DIAGNOSIS — I69998 Other sequelae following unspecified cerebrovascular disease: Secondary | ICD-10-CM

## 2024-02-02 DIAGNOSIS — G629 Polyneuropathy, unspecified: Secondary | ICD-10-CM

## 2024-02-02 DIAGNOSIS — I700 Atherosclerosis of aorta: Secondary | ICD-10-CM

## 2024-02-02 DIAGNOSIS — Z7984 Long term (current) use of oral hypoglycemic drugs: Secondary | ICD-10-CM

## 2024-02-02 DIAGNOSIS — I480 Paroxysmal atrial fibrillation: Secondary | ICD-10-CM | POA: Diagnosis not present

## 2024-02-02 DIAGNOSIS — I69354 Hemiplegia and hemiparesis following cerebral infarction affecting left non-dominant side: Secondary | ICD-10-CM | POA: Diagnosis not present

## 2024-02-02 DIAGNOSIS — Z720 Tobacco use: Secondary | ICD-10-CM | POA: Diagnosis not present

## 2024-02-02 DIAGNOSIS — I6523 Occlusion and stenosis of bilateral carotid arteries: Secondary | ICD-10-CM | POA: Diagnosis not present

## 2024-02-02 DIAGNOSIS — Z794 Long term (current) use of insulin: Secondary | ICD-10-CM | POA: Diagnosis not present

## 2024-02-02 DIAGNOSIS — N1832 Chronic kidney disease, stage 3b: Secondary | ICD-10-CM

## 2024-02-02 DIAGNOSIS — D6861 Antiphospholipid syndrome: Secondary | ICD-10-CM | POA: Diagnosis not present

## 2024-02-02 NOTE — Progress Notes (Signed)
 Received home health orders orders from [facility]. Start of care 01/17/24.   Certification and orders from 01/17/24 through 03/16/24 are reviewed, signed and faxed back to home health company.  Need of intermittent skilled services at home: home bound  The home health care plan has been established by me and will be reviewed and updated as needed to maximize patient recovery.  I certify that all home health services have been and will be furnished to the patient while under my care.  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

## 2024-02-02 NOTE — Telephone Encounter (Signed)
 Faxed order for hospital bed over to [facility] in [place] Fax # [phone].  JM

## 2024-02-02 NOTE — Progress Notes (Signed)
 Care Guide Pharmacy Note  02/02/2024 Name: Corey Howard MRN: [id number] DOB: 1951-04-19  Referred By: Justus Leita DEL, MD Reason for referral: Complex Care Management (Outreach to schedule with Pharm d)   [name] is a 73 y.o. year old male who is a primary care patient of Justus Leita DEL, MD.  [name] was referred to the pharmacist for assistance related to: CVA  A second unsuccessful telephone outreach was attempted today to contact the patient who was referred to the pharmacy team for assistance with medication assistance. Additional attempts will be made to contact the patient.  [name] , RMA     [facility]  [facility] Guide  Direct Dial: [phone]  Website: [email or web address]

## 2024-02-05 NOTE — Progress Notes (Signed)
 Care Guide Pharmacy Note  02/05/2024 Name: SHERIFF RODENBERG MRN: [id number] DOB: 1951-03-21  Referred By: Justus Leita DEL, MD Reason for referral: Complex Care Management (Outreach to schedule with Pharm d)   ANTHONY TAMBURO is a 73 y.o. year old male who is a primary care patient of Justus Leita DEL, MD.  [name] was referred to the pharmacist for assistance related to: CVA  A third unsuccessful telephone outreach was attempted today to contact the patient who was referred to the pharmacy team for assistance with medication assistance. The Population Health team is pleased to engage with this patient at any time in the future upon receipt of referral and should he/she be interested in assistance from the [facility] team.  [name] , RMA     [facility]  [facility] Guide  Direct Dial: [phone]  Website: [email or web address]

## 2024-02-08 DIAGNOSIS — E1140 Type 2 diabetes mellitus with diabetic neuropathy, unspecified: Secondary | ICD-10-CM | POA: Diagnosis not present

## 2024-02-08 DIAGNOSIS — E785 Hyperlipidemia, unspecified: Secondary | ICD-10-CM | POA: Diagnosis not present

## 2024-02-08 DIAGNOSIS — I69398 Other sequelae of cerebral infarction: Secondary | ICD-10-CM | POA: Diagnosis not present

## 2024-02-08 DIAGNOSIS — F17210 Nicotine dependence, cigarettes, uncomplicated: Secondary | ICD-10-CM | POA: Diagnosis not present

## 2024-02-08 DIAGNOSIS — N1832 Chronic kidney disease, stage 3b: Secondary | ICD-10-CM | POA: Diagnosis not present

## 2024-02-08 DIAGNOSIS — E1122 Type 2 diabetes mellitus with diabetic chronic kidney disease: Secondary | ICD-10-CM | POA: Diagnosis not present

## 2024-02-08 DIAGNOSIS — D6861 Antiphospholipid syndrome: Secondary | ICD-10-CM | POA: Diagnosis not present

## 2024-02-08 DIAGNOSIS — I69354 Hemiplegia and hemiparesis following cerebral infarction affecting left non-dominant side: Secondary | ICD-10-CM | POA: Diagnosis not present

## 2024-02-08 DIAGNOSIS — I480 Paroxysmal atrial fibrillation: Secondary | ICD-10-CM | POA: Diagnosis not present

## 2024-02-08 DIAGNOSIS — E213 Hyperparathyroidism, unspecified: Secondary | ICD-10-CM | POA: Diagnosis not present

## 2024-02-08 DIAGNOSIS — I129 Hypertensive chronic kidney disease with stage 1 through stage 4 chronic kidney disease, or unspecified chronic kidney disease: Secondary | ICD-10-CM | POA: Diagnosis not present

## 2024-02-08 DIAGNOSIS — I700 Atherosclerosis of aorta: Secondary | ICD-10-CM | POA: Diagnosis not present

## 2024-02-08 DIAGNOSIS — Z7982 Long term (current) use of aspirin: Secondary | ICD-10-CM | POA: Diagnosis not present

## 2024-02-09 ENCOUNTER — Telehealth: Payer: Self-pay | Admitting: Internal Medicine

## 2024-02-09 NOTE — Telephone Encounter (Signed)
 Copied from CRM [id number]. Topic: Clinical - Order For Equipment >> Feb 09, 2024  9:24 AM Antwanette L wrote: Reason for CRM: Grayce an occupational therapist from [facility] is calling because she needs Dr. Justus to send over a new order for a [facility] bed to [facility]. The previous order that was sent is not in network. Again, fax the new order to [facility] at [phone]. Grayce can be contacted at [phone]

## 2024-02-09 NOTE — Telephone Encounter (Signed)
 Faxed new order.  - Deonna Krummel M.

## 2024-02-10 DIAGNOSIS — Z7982 Long term (current) use of aspirin: Secondary | ICD-10-CM | POA: Diagnosis not present

## 2024-02-10 DIAGNOSIS — I480 Paroxysmal atrial fibrillation: Secondary | ICD-10-CM | POA: Diagnosis not present

## 2024-02-10 DIAGNOSIS — I69398 Other sequelae of cerebral infarction: Secondary | ICD-10-CM | POA: Diagnosis not present

## 2024-02-10 DIAGNOSIS — D6861 Antiphospholipid syndrome: Secondary | ICD-10-CM | POA: Diagnosis not present

## 2024-02-10 DIAGNOSIS — E213 Hyperparathyroidism, unspecified: Secondary | ICD-10-CM | POA: Diagnosis not present

## 2024-02-10 DIAGNOSIS — F17210 Nicotine dependence, cigarettes, uncomplicated: Secondary | ICD-10-CM | POA: Diagnosis not present

## 2024-02-10 DIAGNOSIS — E1140 Type 2 diabetes mellitus with diabetic neuropathy, unspecified: Secondary | ICD-10-CM | POA: Diagnosis not present

## 2024-02-10 DIAGNOSIS — E1122 Type 2 diabetes mellitus with diabetic chronic kidney disease: Secondary | ICD-10-CM | POA: Diagnosis not present

## 2024-02-10 DIAGNOSIS — E785 Hyperlipidemia, unspecified: Secondary | ICD-10-CM | POA: Diagnosis not present

## 2024-02-10 DIAGNOSIS — I69354 Hemiplegia and hemiparesis following cerebral infarction affecting left non-dominant side: Secondary | ICD-10-CM | POA: Diagnosis not present

## 2024-02-10 DIAGNOSIS — I700 Atherosclerosis of aorta: Secondary | ICD-10-CM | POA: Diagnosis not present

## 2024-02-10 DIAGNOSIS — I129 Hypertensive chronic kidney disease with stage 1 through stage 4 chronic kidney disease, or unspecified chronic kidney disease: Secondary | ICD-10-CM | POA: Diagnosis not present

## 2024-02-10 DIAGNOSIS — N1832 Chronic kidney disease, stage 3b: Secondary | ICD-10-CM | POA: Diagnosis not present

## 2024-02-15 DIAGNOSIS — D6861 Antiphospholipid syndrome: Secondary | ICD-10-CM | POA: Diagnosis not present

## 2024-02-15 DIAGNOSIS — I700 Atherosclerosis of aorta: Secondary | ICD-10-CM | POA: Diagnosis not present

## 2024-02-15 DIAGNOSIS — I69398 Other sequelae of cerebral infarction: Secondary | ICD-10-CM | POA: Diagnosis not present

## 2024-02-15 DIAGNOSIS — I480 Paroxysmal atrial fibrillation: Secondary | ICD-10-CM | POA: Diagnosis not present

## 2024-02-15 DIAGNOSIS — Z7982 Long term (current) use of aspirin: Secondary | ICD-10-CM | POA: Diagnosis not present

## 2024-02-15 DIAGNOSIS — E785 Hyperlipidemia, unspecified: Secondary | ICD-10-CM | POA: Diagnosis not present

## 2024-02-15 DIAGNOSIS — F17210 Nicotine dependence, cigarettes, uncomplicated: Secondary | ICD-10-CM | POA: Diagnosis not present

## 2024-02-15 DIAGNOSIS — E213 Hyperparathyroidism, unspecified: Secondary | ICD-10-CM | POA: Diagnosis not present

## 2024-02-15 DIAGNOSIS — E1140 Type 2 diabetes mellitus with diabetic neuropathy, unspecified: Secondary | ICD-10-CM | POA: Diagnosis not present

## 2024-02-15 DIAGNOSIS — I69354 Hemiplegia and hemiparesis following cerebral infarction affecting left non-dominant side: Secondary | ICD-10-CM | POA: Diagnosis not present

## 2024-02-15 DIAGNOSIS — N1832 Chronic kidney disease, stage 3b: Secondary | ICD-10-CM | POA: Diagnosis not present

## 2024-02-15 DIAGNOSIS — I129 Hypertensive chronic kidney disease with stage 1 through stage 4 chronic kidney disease, or unspecified chronic kidney disease: Secondary | ICD-10-CM | POA: Diagnosis not present

## 2024-02-15 DIAGNOSIS — E1122 Type 2 diabetes mellitus with diabetic chronic kidney disease: Secondary | ICD-10-CM | POA: Diagnosis not present

## 2024-02-16 ENCOUNTER — Telehealth: Payer: Self-pay | Admitting: Internal Medicine

## 2024-02-16 DIAGNOSIS — Z7982 Long term (current) use of aspirin: Secondary | ICD-10-CM | POA: Diagnosis not present

## 2024-02-16 DIAGNOSIS — N1832 Chronic kidney disease, stage 3b: Secondary | ICD-10-CM | POA: Diagnosis not present

## 2024-02-16 DIAGNOSIS — E1122 Type 2 diabetes mellitus with diabetic chronic kidney disease: Secondary | ICD-10-CM | POA: Diagnosis not present

## 2024-02-16 DIAGNOSIS — E785 Hyperlipidemia, unspecified: Secondary | ICD-10-CM | POA: Diagnosis not present

## 2024-02-16 DIAGNOSIS — I69354 Hemiplegia and hemiparesis following cerebral infarction affecting left non-dominant side: Secondary | ICD-10-CM | POA: Diagnosis not present

## 2024-02-16 DIAGNOSIS — F17210 Nicotine dependence, cigarettes, uncomplicated: Secondary | ICD-10-CM | POA: Diagnosis not present

## 2024-02-16 DIAGNOSIS — I69398 Other sequelae of cerebral infarction: Secondary | ICD-10-CM | POA: Diagnosis not present

## 2024-02-16 DIAGNOSIS — I700 Atherosclerosis of aorta: Secondary | ICD-10-CM | POA: Diagnosis not present

## 2024-02-16 DIAGNOSIS — E1140 Type 2 diabetes mellitus with diabetic neuropathy, unspecified: Secondary | ICD-10-CM | POA: Diagnosis not present

## 2024-02-16 DIAGNOSIS — D6861 Antiphospholipid syndrome: Secondary | ICD-10-CM | POA: Diagnosis not present

## 2024-02-16 DIAGNOSIS — I480 Paroxysmal atrial fibrillation: Secondary | ICD-10-CM | POA: Diagnosis not present

## 2024-02-16 DIAGNOSIS — E213 Hyperparathyroidism, unspecified: Secondary | ICD-10-CM | POA: Diagnosis not present

## 2024-02-16 DIAGNOSIS — I129 Hypertensive chronic kidney disease with stage 1 through stage 4 chronic kidney disease, or unspecified chronic kidney disease: Secondary | ICD-10-CM | POA: Diagnosis not present

## 2024-02-16 NOTE — Telephone Encounter (Signed)
 Copied from CRM [id number]. Topic: Clinical - Order For Equipment >> Feb 16, 2024 12:50 PM Elle L wrote: Reason for CRM: Robin, Wellsite geologist with [facility], states [facility] needs an order for a hospital bed with a bar and a note justifying the need for the hospital bed. Their fax number [phone]. They requested a STAT order.

## 2024-02-16 NOTE — Telephone Encounter (Signed)
 No call back number. I have sent over an RX to several different locations for this for patient along with office visit notes?

## 2024-02-18 ENCOUNTER — Telehealth: Payer: Self-pay | Admitting: Internal Medicine

## 2024-02-18 NOTE — Telephone Encounter (Signed)
 Copied from CRM [id number]. Topic: Clinical - Order For Equipment >> Feb 18, 2024 11:55 AM DeAngela L wrote: Reason for CRM: [facility] states they still has not received anything from Dr Justus office and the patient still has not received a hospital bed and the patients therapy might run out before he actually gets a hospital bed and Grayce [place] with [facility] is calling again today to ask if this order was sent STAT so this patient can get the bed asap ( the original order was 1st requested on 01/27/24) the time is running out  Grayce [place] num [phone] please call Grayce to help the pt get a bed

## 2024-02-19 NOTE — Telephone Encounter (Signed)
 Faxed DME order, demographics, and last OV visit to [phone] for the second time.

## 2024-02-22 DIAGNOSIS — D6861 Antiphospholipid syndrome: Secondary | ICD-10-CM | POA: Diagnosis not present

## 2024-02-22 DIAGNOSIS — N1832 Chronic kidney disease, stage 3b: Secondary | ICD-10-CM | POA: Diagnosis not present

## 2024-02-22 DIAGNOSIS — E1140 Type 2 diabetes mellitus with diabetic neuropathy, unspecified: Secondary | ICD-10-CM | POA: Diagnosis not present

## 2024-02-22 DIAGNOSIS — I69398 Other sequelae of cerebral infarction: Secondary | ICD-10-CM | POA: Diagnosis not present

## 2024-02-22 DIAGNOSIS — E213 Hyperparathyroidism, unspecified: Secondary | ICD-10-CM | POA: Diagnosis not present

## 2024-02-22 DIAGNOSIS — E785 Hyperlipidemia, unspecified: Secondary | ICD-10-CM | POA: Diagnosis not present

## 2024-02-22 DIAGNOSIS — Z7982 Long term (current) use of aspirin: Secondary | ICD-10-CM | POA: Diagnosis not present

## 2024-02-22 DIAGNOSIS — I69354 Hemiplegia and hemiparesis following cerebral infarction affecting left non-dominant side: Secondary | ICD-10-CM | POA: Diagnosis not present

## 2024-02-22 DIAGNOSIS — I129 Hypertensive chronic kidney disease with stage 1 through stage 4 chronic kidney disease, or unspecified chronic kidney disease: Secondary | ICD-10-CM | POA: Diagnosis not present

## 2024-02-22 DIAGNOSIS — I700 Atherosclerosis of aorta: Secondary | ICD-10-CM | POA: Diagnosis not present

## 2024-02-22 DIAGNOSIS — E1122 Type 2 diabetes mellitus with diabetic chronic kidney disease: Secondary | ICD-10-CM | POA: Diagnosis not present

## 2024-02-22 DIAGNOSIS — I480 Paroxysmal atrial fibrillation: Secondary | ICD-10-CM | POA: Diagnosis not present

## 2024-02-22 DIAGNOSIS — F17210 Nicotine dependence, cigarettes, uncomplicated: Secondary | ICD-10-CM | POA: Diagnosis not present

## 2024-02-23 DIAGNOSIS — R531 Weakness: Secondary | ICD-10-CM | POA: Diagnosis not present

## 2024-02-23 DIAGNOSIS — R262 Difficulty in walking, not elsewhere classified: Secondary | ICD-10-CM | POA: Diagnosis not present

## 2024-02-23 DIAGNOSIS — I69998 Other sequelae following unspecified cerebrovascular disease: Secondary | ICD-10-CM | POA: Diagnosis not present

## 2024-02-24 DIAGNOSIS — I69398 Other sequelae of cerebral infarction: Secondary | ICD-10-CM | POA: Diagnosis not present

## 2024-02-24 DIAGNOSIS — F17210 Nicotine dependence, cigarettes, uncomplicated: Secondary | ICD-10-CM | POA: Diagnosis not present

## 2024-02-24 DIAGNOSIS — Z7982 Long term (current) use of aspirin: Secondary | ICD-10-CM | POA: Diagnosis not present

## 2024-02-24 DIAGNOSIS — D6861 Antiphospholipid syndrome: Secondary | ICD-10-CM | POA: Diagnosis not present

## 2024-02-24 DIAGNOSIS — E1140 Type 2 diabetes mellitus with diabetic neuropathy, unspecified: Secondary | ICD-10-CM | POA: Diagnosis not present

## 2024-02-24 DIAGNOSIS — E785 Hyperlipidemia, unspecified: Secondary | ICD-10-CM | POA: Diagnosis not present

## 2024-02-24 DIAGNOSIS — E1122 Type 2 diabetes mellitus with diabetic chronic kidney disease: Secondary | ICD-10-CM | POA: Diagnosis not present

## 2024-02-24 DIAGNOSIS — I129 Hypertensive chronic kidney disease with stage 1 through stage 4 chronic kidney disease, or unspecified chronic kidney disease: Secondary | ICD-10-CM | POA: Diagnosis not present

## 2024-02-24 DIAGNOSIS — E213 Hyperparathyroidism, unspecified: Secondary | ICD-10-CM | POA: Diagnosis not present

## 2024-02-24 DIAGNOSIS — N1832 Chronic kidney disease, stage 3b: Secondary | ICD-10-CM | POA: Diagnosis not present

## 2024-02-24 DIAGNOSIS — I480 Paroxysmal atrial fibrillation: Secondary | ICD-10-CM | POA: Diagnosis not present

## 2024-02-24 DIAGNOSIS — I700 Atherosclerosis of aorta: Secondary | ICD-10-CM | POA: Diagnosis not present

## 2024-02-24 DIAGNOSIS — I69354 Hemiplegia and hemiparesis following cerebral infarction affecting left non-dominant side: Secondary | ICD-10-CM | POA: Diagnosis not present

## 2024-02-29 DIAGNOSIS — I69354 Hemiplegia and hemiparesis following cerebral infarction affecting left non-dominant side: Secondary | ICD-10-CM | POA: Diagnosis not present

## 2024-02-29 DIAGNOSIS — E1140 Type 2 diabetes mellitus with diabetic neuropathy, unspecified: Secondary | ICD-10-CM | POA: Diagnosis not present

## 2024-02-29 DIAGNOSIS — F17210 Nicotine dependence, cigarettes, uncomplicated: Secondary | ICD-10-CM | POA: Diagnosis not present

## 2024-02-29 DIAGNOSIS — I480 Paroxysmal atrial fibrillation: Secondary | ICD-10-CM | POA: Diagnosis not present

## 2024-02-29 DIAGNOSIS — I69398 Other sequelae of cerebral infarction: Secondary | ICD-10-CM | POA: Diagnosis not present

## 2024-02-29 DIAGNOSIS — I129 Hypertensive chronic kidney disease with stage 1 through stage 4 chronic kidney disease, or unspecified chronic kidney disease: Secondary | ICD-10-CM | POA: Diagnosis not present

## 2024-02-29 DIAGNOSIS — E213 Hyperparathyroidism, unspecified: Secondary | ICD-10-CM | POA: Diagnosis not present

## 2024-02-29 DIAGNOSIS — Z7982 Long term (current) use of aspirin: Secondary | ICD-10-CM | POA: Diagnosis not present

## 2024-02-29 DIAGNOSIS — E1122 Type 2 diabetes mellitus with diabetic chronic kidney disease: Secondary | ICD-10-CM | POA: Diagnosis not present

## 2024-02-29 DIAGNOSIS — I700 Atherosclerosis of aorta: Secondary | ICD-10-CM | POA: Diagnosis not present

## 2024-02-29 DIAGNOSIS — N1832 Chronic kidney disease, stage 3b: Secondary | ICD-10-CM | POA: Diagnosis not present

## 2024-02-29 DIAGNOSIS — D6861 Antiphospholipid syndrome: Secondary | ICD-10-CM | POA: Diagnosis not present

## 2024-02-29 DIAGNOSIS — E785 Hyperlipidemia, unspecified: Secondary | ICD-10-CM | POA: Diagnosis not present

## 2024-03-02 ENCOUNTER — Other Ambulatory Visit: Payer: Self-pay | Admitting: Internal Medicine

## 2024-03-02 ENCOUNTER — Telehealth: Payer: Self-pay

## 2024-03-02 DIAGNOSIS — I69998 Other sequelae following unspecified cerebrovascular disease: Secondary | ICD-10-CM

## 2024-03-02 NOTE — Telephone Encounter (Signed)
 Copied from CRM [id number]. Topic: Clinical - Order For Equipment >> Mar 01, 2024  8:22 AM Carlatta H wrote: Reason for RMF:[id number] Faxed DME order needs resent with the trapeze bar for hospital bed he already has//[facility]//[phone] for the second time.

## 2024-03-02 NOTE — Progress Notes (Unsigned)
 Date:  03/02/2024   Name:  Corey Howard   DOB:  07-03-1951   MRN:  [id number]   Chief Complaint: No chief complaint on file.  HPI  Review of Systems   Lab Results  Component Value Date   NA 141 01/07/2024   K 3.8 01/07/2024   CO2 24 01/07/2024   GLUCOSE 256 (H) 01/07/2024   BUN 28 (H) 01/07/2024   CREATININE 1.71 (H) 01/07/2024   CALCIUM  9.0 01/07/2024   EGFR 40 06/11/2023   GFRNONAA 43 (L) 01/07/2024   Lab Results  Component Value Date   CHOL 122 01/16/2023   HDL 41 01/16/2023   LDLCALC 59 01/16/2023   TRIG 108 01/16/2023   CHOLHDL 4.2 10/09/2015   No results found for: TSH Lab Results  Component Value Date   HGBA1C 6.0 10/22/2023   Lab Results  Component Value Date   WBC 10.5 01/07/2024   HGB 14.3 01/07/2024   HCT 42.6 01/07/2024   MCV 89.7 01/07/2024   PLT 327 01/07/2024   Lab Results  Component Value Date   ALT 24 01/07/2024   AST 23 01/07/2024   ALKPHOS 93 01/07/2024   BILITOT 1.0 01/07/2024   No results found for: MARIEN BOLLS, VD25OH   Patient Active Problem List   Diagnosis Date Noted   Diabetes mellitus treated with insulin and oral medication (HCC) 02/02/2024   Ambulatory dysfunction 01/27/2024   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) 01/27/2024   Paroxysmal atrial fibrillation (HCC) 01/27/2024   Carotid artery stenosis 01/27/2024   Aortic atherosclerosis (HCC) 01/25/2024   Facial weakness following cerebrovascular accident (CVA) 11/02/2023   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 11/02/2023   Oropharyngeal dysphagia 11/02/2023   Long term current use of anticoagulant therapy 11/02/2023   Ischemic stroke (HCC) 10/25/2023   Acquired thrombophilia (HCC) 06/08/2023   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   Neuropathy 03/04/2023   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   History of Bell's palsy    Tobacco abuse 01/27/2017   Seborrhea  10/01/2015   Type II diabetes mellitus with complication (HCC) 03/16/2015   Essential hypertension 03/16/2015    Allergies  Allergen Reactions   Penicillin G Other (See Comments)    Past Surgical History:  Procedure Laterality Date   TONSILLECTOMY      Social History   Tobacco Use   Smoking status: Every Day    Current packs/day: 0.25    Average packs/day: 0.4 packs/day for 88.6 years (32.1 ttl pk-yrs)    Types: Cigarettes    Start date: 1977   Smokeless tobacco: Never  Vaping Use   Vaping status: Never Used  Substance Use Topics   Alcohol use: Not Currently   Drug use: No     Medication list has been reviewed and updated.  No outpatient medications have been marked as taking for the 03/02/24 encounter (Orders Only) with Justus Leita DEL, MD.       01/27/2024    3:28 PM 09/23/2023    3:02 PM 06/08/2023    3:03 PM 03/04/2023    1:56 PM  GAD 7 : Generalized Anxiety Score  Nervous, Anxious, on Edge 0 0 0 0  Control/stop worrying 0 0 0 0  Worry too much - different things 0 0 0 0  Trouble relaxing 0 0 0 0  Restless 0 0 0 0  Easily annoyed or irritable 0 0 0 0  Afraid - awful might happen  0 0 0 0  Total GAD 7 Score 0 0 0 0  Anxiety Difficulty Not difficult at all Not difficult at all Not difficult at all Not difficult at all       01/27/2024    3:28 PM 09/23/2023    3:02 PM 06/08/2023    3:03 PM  Depression screen PHQ 2/9  Decreased Interest 0 0 0  Down, Depressed, Hopeless 0 0 0  PHQ - 2 Score 0 0 0  Altered sleeping 0 0 0  Tired, decreased energy 0 0 0  Change in appetite 0 0 0  Feeling bad or failure about yourself  0 0 0  Trouble concentrating 0 0 0  Moving slowly or fidgety/restless 0 0 0  Suicidal thoughts 0 0 0  PHQ-9 Score 0 0 0  Difficult doing work/chores Not difficult at all Not difficult at all Not difficult at all    BP Readings from Last 3 Encounters:  01/27/24 120/74  01/11/24 (!) 107/56  09/23/23 120/62    Physical Exam  Wt  Readings from Last 3 Encounters:  01/27/24 184 lb (83.5 kg)  09/23/23 201 lb 1.6 oz (91.2 kg)  06/09/23 209 lb (94.8 kg)    There were no vitals taken for this visit.  Assessment and Plan:  Problem List Items Addressed This Visit   None   No follow-ups on file.    Leita HILARIO Adie, MD [facility] Primary Care and Sports Medicine [place]

## 2024-03-03 DIAGNOSIS — I69354 Hemiplegia and hemiparesis following cerebral infarction affecting left non-dominant side: Secondary | ICD-10-CM | POA: Diagnosis not present

## 2024-03-07 DIAGNOSIS — I639 Cerebral infarction, unspecified: Secondary | ICD-10-CM | POA: Diagnosis not present

## 2024-03-10 DIAGNOSIS — I480 Paroxysmal atrial fibrillation: Secondary | ICD-10-CM | POA: Diagnosis not present

## 2024-03-10 DIAGNOSIS — Z7982 Long term (current) use of aspirin: Secondary | ICD-10-CM | POA: Diagnosis not present

## 2024-03-10 DIAGNOSIS — D6861 Antiphospholipid syndrome: Secondary | ICD-10-CM | POA: Diagnosis not present

## 2024-03-10 DIAGNOSIS — I69398 Other sequelae of cerebral infarction: Secondary | ICD-10-CM | POA: Diagnosis not present

## 2024-03-10 DIAGNOSIS — I700 Atherosclerosis of aorta: Secondary | ICD-10-CM | POA: Diagnosis not present

## 2024-03-10 DIAGNOSIS — E1122 Type 2 diabetes mellitus with diabetic chronic kidney disease: Secondary | ICD-10-CM | POA: Diagnosis not present

## 2024-03-10 DIAGNOSIS — E1140 Type 2 diabetes mellitus with diabetic neuropathy, unspecified: Secondary | ICD-10-CM | POA: Diagnosis not present

## 2024-03-10 DIAGNOSIS — I129 Hypertensive chronic kidney disease with stage 1 through stage 4 chronic kidney disease, or unspecified chronic kidney disease: Secondary | ICD-10-CM | POA: Diagnosis not present

## 2024-03-10 DIAGNOSIS — I69354 Hemiplegia and hemiparesis following cerebral infarction affecting left non-dominant side: Secondary | ICD-10-CM | POA: Diagnosis not present

## 2024-03-10 DIAGNOSIS — E785 Hyperlipidemia, unspecified: Secondary | ICD-10-CM | POA: Diagnosis not present

## 2024-03-10 DIAGNOSIS — E213 Hyperparathyroidism, unspecified: Secondary | ICD-10-CM | POA: Diagnosis not present

## 2024-03-10 DIAGNOSIS — F17210 Nicotine dependence, cigarettes, uncomplicated: Secondary | ICD-10-CM | POA: Diagnosis not present

## 2024-03-10 DIAGNOSIS — N1832 Chronic kidney disease, stage 3b: Secondary | ICD-10-CM | POA: Diagnosis not present

## 2024-03-25 DIAGNOSIS — R262 Difficulty in walking, not elsewhere classified: Secondary | ICD-10-CM | POA: Diagnosis not present

## 2024-03-25 DIAGNOSIS — I69998 Other sequelae following unspecified cerebrovascular disease: Secondary | ICD-10-CM | POA: Diagnosis not present

## 2024-03-25 DIAGNOSIS — R531 Weakness: Secondary | ICD-10-CM | POA: Diagnosis not present

## 2024-04-03 DIAGNOSIS — I69354 Hemiplegia and hemiparesis following cerebral infarction affecting left non-dominant side: Secondary | ICD-10-CM | POA: Diagnosis not present

## 2024-04-07 DIAGNOSIS — I639 Cerebral infarction, unspecified: Secondary | ICD-10-CM | POA: Diagnosis not present

## 2024-04-15 ENCOUNTER — Inpatient Hospital Stay: Payer: No Typology Code available for payment source | Attending: Oncology

## 2024-04-15 ENCOUNTER — Inpatient Hospital Stay

## 2024-04-15 ENCOUNTER — Inpatient Hospital Stay (HOSPITAL_BASED_OUTPATIENT_CLINIC_OR_DEPARTMENT_OTHER): Payer: No Typology Code available for payment source | Admitting: Oncology

## 2024-04-15 ENCOUNTER — Encounter: Payer: Self-pay | Admitting: Oncology

## 2024-04-15 VITALS — BP 113/80 | HR 81 | Temp 97.2°F | Resp 19 | Ht 73.0 in | Wt 172.0 lb

## 2024-04-15 DIAGNOSIS — F17210 Nicotine dependence, cigarettes, uncomplicated: Secondary | ICD-10-CM | POA: Insufficient documentation

## 2024-04-15 DIAGNOSIS — Z7901 Long term (current) use of anticoagulants: Secondary | ICD-10-CM | POA: Diagnosis not present

## 2024-04-15 DIAGNOSIS — D6861 Antiphospholipid syndrome: Secondary | ICD-10-CM

## 2024-04-15 DIAGNOSIS — Z86718 Personal history of other venous thrombosis and embolism: Secondary | ICD-10-CM | POA: Insufficient documentation

## 2024-04-15 DIAGNOSIS — Z79899 Other long term (current) drug therapy: Secondary | ICD-10-CM

## 2024-04-15 DIAGNOSIS — I82512 Chronic embolism and thrombosis of left femoral vein: Secondary | ICD-10-CM | POA: Diagnosis not present

## 2024-04-15 DIAGNOSIS — D751 Secondary polycythemia: Secondary | ICD-10-CM | POA: Diagnosis not present

## 2024-04-15 LAB — COMPREHENSIVE METABOLIC PANEL WITH GFR
ALT: 15 U/L (ref 0–44)
AST: 19 U/L (ref 15–41)
Albumin: 3.9 g/dL (ref 3.5–5.0)
Alkaline Phosphatase: 104 U/L (ref 38–126)
Anion gap: 11 (ref 5–15)
BUN: 30 mg/dL — ABNORMAL HIGH (ref 8–23)
CO2: 23 mmol/L (ref 22–32)
Calcium: 9.3 mg/dL (ref 8.9–10.3)
Chloride: 104 mmol/L (ref 98–111)
Creatinine, Ser: 1.84 mg/dL — ABNORMAL HIGH (ref 0.61–1.24)
GFR, Estimated: 39 mL/min — ABNORMAL LOW (ref >60)
Glucose, Bld: 137 mg/dL — ABNORMAL HIGH (ref 70–99)
Potassium: 3.9 mmol/L (ref 3.5–5.1)
Sodium: 138 mmol/L (ref 135–145)
Total Bilirubin: 0.7 mg/dL (ref 0.0–1.2)
Total Protein: 6.8 g/dL (ref 6.5–8.1)

## 2024-04-15 LAB — CBC
HCT: 52.4 % — ABNORMAL HIGH (ref 39.0–52.0)
Hemoglobin: 17.4 g/dL — ABNORMAL HIGH (ref 13.0–17.0)
MCH: 29.8 pg (ref 26.0–34.0)
MCHC: 33.2 g/dL (ref 30.0–36.0)
MCV: 89.9 fL (ref 80.0–100.0)
Platelets: 332 K/uL (ref 150–400)
RBC: 5.83 MIL/uL — ABNORMAL HIGH (ref 4.22–5.81)
RDW: 12.9 % (ref 11.5–15.5)
WBC: 9.8 K/uL (ref 4.0–10.5)
nRBC: 0 % (ref 0.0–0.2)

## 2024-04-15 NOTE — Progress Notes (Signed)
 Hematology/Oncology Consult note [facility]  Telephone:([phone] [phone]  Patient Care Team: Justus Leita DEL, MD as PCP - General (Internal Medicine) Laurice Francis NOVAK, OD (Optometry) Marcelino Gales, MD (Nephrology) Melanee Annah BROCKS, MD as Consulting Physician (Oncology)   Name of the patient: Corey Howard  [id number]  06-21-1951   Date of visit: 04/15/24  Diagnosis-antiphospholipid antibody syndrome  Chief complaint/ Reason for visit-routine follow-up of antiphospholipid antibody syndrome presently on Eliquis   Heme/Onc history: patient is a 73 year old male who presented with symptoms of left lower extremity pain and swelling.  Ultrasound on 04/23/2021 showed diffuse thrombus involving the left superficial femoral popliteal and posterior tibial veins.  No preceding trauma, surgery, prolonged immobilizations or recent illnesses.  He has been up-to-date with his colonoscopy and EGD denies any recent unintentional weight loss.  Patient was initially started on Xarelto but he could not afford the co-pay.  He was transiently switched to Coumadin but presently on Eliquis  without any significant side effects.  Benefits of Coumadin over Eliquis  discussed with the patient but he has chosen to proceed with Eliquis  due to convenience   Results of blood work from 06/14/2021 were as follows: Factor V Leiden and prothrombin gene mutation negative.  Protein C protein S and Antithrombin III  levels were normal.  Anticardiolipin antibodies IgM levels elevated at 102 and IgG levels elevated at 41. Repeat cardiolipin antibodies in February 2023 were again elevated with an IgG titer of 42 and IgM titer of 75 thereby consistent with diagnosis of antiphospholipid antibody syndrome. Beta-2 glycoprotein levels negative.  Lupus anticoagulant testing did not show any presence of lupus anticoagulant.  Patient was admitted for multifocal stroke at [facility] in February 2025.  He remains  on Eliquis   Interval history-patient states that he was compliant with Eliquis  even before he was admitted for stroke at [facility] in February 2025.  He reports ongoing fatigue and strength is not back to his baseline poststroke  ECOG PS- 2 Pain scale- 0   Review of systems- Review of Systems  Constitutional:  Positive for malaise/fatigue. Negative for chills, fever and weight loss.  HENT:  Negative for congestion, ear discharge and nosebleeds.   Eyes:  Negative for blurred vision.  Respiratory:  Negative for cough, hemoptysis, sputum production, shortness of breath and wheezing.   Cardiovascular:  Negative for chest pain, palpitations, orthopnea and claudication.  Gastrointestinal:  Negative for abdominal pain, blood in stool, constipation, diarrhea, heartburn, melena, nausea and vomiting.  Genitourinary:  Negative for dysuria, flank pain, frequency, hematuria and urgency.  Musculoskeletal:  Negative for back pain, joint pain and myalgias.  Skin:  Negative for rash.  Neurological:  Negative for dizziness, tingling, focal weakness, seizures, weakness and headaches.  Endo/Heme/Allergies:  Does not bruise/bleed easily.  Psychiatric/Behavioral:  Negative for depression and suicidal ideas. The patient does not have insomnia.       Allergies  Allergen Reactions   Penicillin G Other (See Comments)     Past Medical History:  Diagnosis Date   Diabetes mellitus without complication (HCC)    DVT (deep venous thrombosis) (HCC)    History of DVT (deep vein thrombosis)    Hyperlipidemia      Past Surgical History:  Procedure Laterality Date   TONSILLECTOMY      Social History   Socioeconomic History   Marital status: Single    Spouse name: Not on file   Number of children: 0   Years of education: Not on file  Highest education level: Not on file  Occupational History   Not on file  Tobacco Use   Smoking status: Every Day    Current packs/day: 0.25    Average packs/day: 0.4  packs/day for 88.7 years (32.2 ttl pk-yrs)    Types: Cigarettes    Start date: 20   Smokeless tobacco: Never  Vaping Use   Vaping status: Never Used  Substance and Sexual Activity   Alcohol use: Not Currently   Drug use: No   Sexual activity: Not Currently  Other Topics Concern   Not on file  Social History Narrative   Not on file   Social Drivers of Health   Financial Resource Strain: Low Risk  (10/23/2023)   Received from [facility]   Overall Financial Resource Strain (CARDIA)    Difficulty of Paying Living Expenses: Not very hard  Food Insecurity: No Food Insecurity (10/23/2023)   Received from [facility]   Hunger Vital Sign    Within the past 12 months, you worried that your food would run out before you got the money to buy more.: Never true    Within the past 12 months, the food you bought just didn't last and you didn't have money to get more.: Never true  Transportation Needs: No Transportation Needs (10/23/2023)   Received from [facility]   PRAPARE - Transportation    Lack of Transportation (Medical): No    Lack of Transportation (Non-Medical): No  Physical Activity: Not on file  Stress: Not on file  Social Connections: Not on file  Intimate Partner Violence: Not At Risk (10/22/2023)   Received from [facility]   Humiliation, Afraid, Rape, and Kick questionnaire    Within the last year, have you been afraid of your partner or ex-partner?: No    Within the last year, have you been humiliated or emotionally abused in other ways by your partner or ex-partner?: No    Within the last year, have you been kicked, hit, slapped, or otherwise physically hurt by your partner or ex-partner?: No    Within the last year, have you been raped or forced to have any kind of sexual activity by your partner or ex-partner?: No    Family History  Problem Relation Age of Onset   Heart attack Mother    Prostate cancer Neg Hx    Colon cancer Neg Hx      Current  Outpatient Medications:    apixaban  (ELIQUIS ) 5 MG TABS tablet, Take 1 tablet (5 mg total) by mouth 2 (two) times daily., Disp: 180 tablet, Rfl: 1   atorvastatin  (LIPITOR) 20 MG tablet, Take 1 tablet by mouth daily., Disp: , Rfl:    B Complex Vitamins (VITAMIN B-COMPLEX) TABS, Take 1 tablet by mouth daily., Disp: , Rfl:    cyanocobalamin 1000 MCG tablet, Take by mouth daily., Disp: , Rfl:    empagliflozin  (JARDIANCE ) 10 MG TABS tablet, Take 10 mg by mouth daily., Disp: , Rfl:    glipiZIDE  (GLUCOTROL  XL) 2.5 MG 24 hr tablet, TAKE 1 TABLET BY MOUTH EVERY DAY WITH BREAKFAST, Disp: 90 tablet, Rfl: 0   lisinopril  (ZESTRIL ) 40 MG tablet, Take 40 mg by mouth daily., Disp: , Rfl:    amLODipine (NORVASC) 5 MG tablet, Take 1 tablet by mouth daily. (Patient not taking: Reported on 04/15/2024), Disp: , Rfl:    Blood Glucose Monitoring Suppl (FIFTY50 GLUCOSE METER 2.0) w/Device KIT, See admin instructions. (Patient not taking: Reported on 04/15/2024), Disp: ,  Rfl:    glucose blood (ONETOUCH ULTRA) test strip, by XX route once daily (Patient not taking: Reported on 04/15/2024), Disp: , Rfl:   Physical exam:  Vitals:   04/15/24 1209  BP: 113/80  Pulse: 81  Resp: 19  Temp: (!) 97.2 F (36.2 C)  TempSrc: Tympanic  SpO2: 98%  Weight: 172 lb (78 kg)  Height: 6' 1 (1.854 m)   Physical Exam Constitutional:      Comments: Sitting in a wheelchair.  Appears fatigued  Cardiovascular:     Rate and Rhythm: Normal rate and regular rhythm.     Heart sounds: Normal heart sounds.  Pulmonary:     Effort: Pulmonary effort is normal.     Breath sounds: Normal breath sounds.  Abdominal:     General: Bowel sounds are normal.     Palpations: Abdomen is soft.  Skin:    General: Skin is warm and dry.  Neurological:     Mental Status: He is alert and oriented to person, place, and time.      I have personally reviewed labs listed below:    Latest Ref Rng & Units 04/15/2024   11:52 AM  CMP  Glucose 70 - 99 mg/dL  862   BUN 8 - 23 mg/dL 30   Creatinine 9.38 - 1.24 mg/dL 8.15   Sodium 864 - 854 mmol/L 138   Potassium 3.5 - 5.1 mmol/L 3.9   Chloride 98 - 111 mmol/L 104   CO2 22 - 32 mmol/L 23   Calcium  8.9 - 10.3 mg/dL 9.3   Total Protein 6.5 - 8.1 g/dL 6.8   Total Bilirubin 0.0 - 1.2 mg/dL 0.7   Alkaline Phos 38 - 126 U/L 104   AST 15 - 41 U/L 19   ALT 0 - 44 U/L 15       Latest Ref Rng & Units 04/15/2024   11:52 AM  CBC  WBC 4.0 - 10.5 K/uL 9.8   Hemoglobin 13.0 - 17.0 g/dL 82.5   Hematocrit 60.9 - 52.0 % 52.4   Platelets 150 - 400 K/uL 332      Assessment and plan- Patient is a 73 y.o. male with history of antiphospholipid antibody syndrome presently on Eliquis  here for routine follow-up  In the past I had discussed switching from Eliquis  to Coumadin given the diagnosis of antiphospholipid antibody syndrome.  However patient wanted to continue with Eliquis  due to ease of monitoring.  Patient has now had multifocal ischemic stroke on Eliquis  in February 2025.  We again discussed switching to Coumadin which the patient does not want to do at this time.  He will continue with Eliquis  5 mg twice daily.  He has mild polycythemia on today's labs and I will be checking JAK2, CALR and MPL testing as well as EPO levels today.  CBC with differential in 6 months in 1 year and I will see him back in 1 year.  If there are any concerning findings on today's lab work we will reach out to the patient sooner   Visit Diagnosis 1. Chronic deep vein thrombosis (DVT) of femoral vein of left lower extremity (HCC)   2. Encounter for long-term (current) use of high-risk medication   3. Antiphospholipid antibody syndrome (HCC)   4. Polycythemia      Dr. Annah Skene, MD, MPH [facility] [phone] 04/15/2024 1:12 PM

## 2024-04-15 NOTE — Progress Notes (Signed)
 Patient doing okay. Still recovering from stroke on 09/24/23.

## 2024-04-16 LAB — ERYTHROPOIETIN: Erythropoietin: 7.4 m[IU]/mL (ref 2.6–18.5)

## 2024-04-20 LAB — JAK2 GENOTYPR

## 2024-04-21 LAB — CALRETICULIN (CALR) MUTATION ANALYSIS

## 2024-04-21 LAB — MPL MUTATION ANALYSIS

## 2024-04-22 ENCOUNTER — Telehealth: Payer: Self-pay | Admitting: *Deleted

## 2024-04-22 ENCOUNTER — Other Ambulatory Visit: Payer: Self-pay | Admitting: Internal Medicine

## 2024-04-22 DIAGNOSIS — E118 Type 2 diabetes mellitus with unspecified complications: Secondary | ICD-10-CM

## 2024-04-22 NOTE — Telephone Encounter (Signed)
 Requested Prescriptions  Pending Prescriptions Disp Refills   glipiZIDE  (GLUCOTROL  XL) 2.5 MG 24 hr tablet [Pharmacy Med Name: GLIPIZIDE  ER 2.5 MG TABLET] 90 tablet 0    Sig: TAKE 1 TABLET BY MOUTH EVERY DAY WITH BREAKFAST     Endocrinology:  Diabetes - Sulfonylureas Failed - 04/22/2024  4:19 PM      Failed - HBA1C is between 0 and 7.9 and within 180 days    Hemoglobin A1C  Date Value Ref Range Status  10/22/2023 6.0  Final         Failed - Cr in normal range and within 360 days    Creatinine, Ser  Date Value Ref Range Status  04/15/2024 1.84 (H) 0.61 - 1.24 mg/dL Final   Creatinine, Urine  Date Value Ref Range Status  06/11/2023 184  Final         Failed - Valid encounter within last 6 months    Recent Outpatient Visits           2 months ago Essential hypertension   [place] [facility] at [facility], Leita DEL, MD   7 months ago Type II diabetes mellitus with complication [facility])   [place] [facility] at [facility], Leita DEL, MD       Future Appointments             In 1 week Justus, Leita DEL, MD [facility] at [facility], [phone] [place]

## 2024-04-22 NOTE — Telephone Encounter (Signed)
 Patient called about a medication however when I called him back he said that he has 60 pills of the Eliquis  and he did not know that so I told him when he gets down to 2 days before needing it then he could call us  and then we can get it set up for him.  He is agreeable for that.

## 2024-04-25 DIAGNOSIS — R262 Difficulty in walking, not elsewhere classified: Secondary | ICD-10-CM | POA: Diagnosis not present

## 2024-04-25 DIAGNOSIS — I69998 Other sequelae following unspecified cerebrovascular disease: Secondary | ICD-10-CM | POA: Diagnosis not present

## 2024-04-25 DIAGNOSIS — R531 Weakness: Secondary | ICD-10-CM | POA: Diagnosis not present

## 2024-04-29 ENCOUNTER — Ambulatory Visit: Admitting: Internal Medicine

## 2024-04-29 ENCOUNTER — Encounter: Payer: Self-pay | Admitting: Internal Medicine

## 2024-04-29 VITALS — BP 98/62 | HR 88

## 2024-04-29 DIAGNOSIS — I480 Paroxysmal atrial fibrillation: Secondary | ICD-10-CM

## 2024-04-29 DIAGNOSIS — Z8673 Personal history of transient ischemic attack (TIA), and cerebral infarction without residual deficits: Secondary | ICD-10-CM | POA: Diagnosis not present

## 2024-04-29 DIAGNOSIS — R262 Difficulty in walking, not elsewhere classified: Secondary | ICD-10-CM

## 2024-04-29 DIAGNOSIS — I10 Essential (primary) hypertension: Secondary | ICD-10-CM | POA: Diagnosis not present

## 2024-04-29 DIAGNOSIS — E118 Type 2 diabetes mellitus with unspecified complications: Secondary | ICD-10-CM | POA: Diagnosis not present

## 2024-04-29 DIAGNOSIS — I69392 Facial weakness following cerebral infarction: Secondary | ICD-10-CM

## 2024-04-29 DIAGNOSIS — I6523 Occlusion and stenosis of bilateral carotid arteries: Secondary | ICD-10-CM | POA: Diagnosis not present

## 2024-04-29 DIAGNOSIS — Z7984 Long term (current) use of oral hypoglycemic drugs: Secondary | ICD-10-CM

## 2024-04-29 LAB — POCT GLYCOSYLATED HEMOGLOBIN (HGB A1C): Hemoglobin A1C: 5.7 % — AB (ref 4.0–5.6)

## 2024-04-29 MED ORDER — ATORVASTATIN CALCIUM 20 MG PO TABS: 20.0000 mg | ORAL_TABLET | Freq: Every day | ORAL | 1 refills | Status: AC

## 2024-04-29 MED ORDER — LISINOPRIL 40 MG PO TABS: 40.0000 mg | ORAL_TABLET | Freq: Every day | ORAL | 1 refills | Status: AC

## 2024-04-29 NOTE — Assessment & Plan Note (Addendum)
 He continues on Eliquis  will minor bleeding issues. Rate controlled but irregular rate by exam. Will refer to Cardiology for further management.

## 2024-04-29 NOTE — Assessment & Plan Note (Signed)
 Blood sugars have been stable.  No hypoglycemic events since last visit. Currently medications are glipizide  and jardiance . Last visit medical regimen changes were none. Lab Results  Component Value Date   HGBA1C 6.0 10/22/2023  A1C today = 5.7

## 2024-04-29 NOTE — Assessment & Plan Note (Signed)
 Mostly in wheelchair but can stand and take a few steps with support. Would like to continue physical therapy - will refer to out patient.

## 2024-04-29 NOTE — Assessment & Plan Note (Signed)
 Stable and not interfering with speech or po intake

## 2024-04-29 NOTE — Assessment & Plan Note (Addendum)
 Continue Eliquis . Will refer to VS for follow up care of >70% left ICA stenosis Continue Atorvastatin .

## 2024-04-29 NOTE — Progress Notes (Signed)
 Date:  04/29/2024   Name:  Corey Howard   DOB:  Feb 26, 1951   MRN:  [id number]   Chief Complaint: Medical Management of Chronic Issues (HTN. DM)  Hypertension Pertinent negatives include no chest pain, headaches, palpitations or shortness of breath.  Diabetes He presents for his follow-up diabetic visit. He has type 2 diabetes mellitus. His disease course has been stable. Pertinent negatives for hypoglycemia include no dizziness, headaches or nervousness/anxiousness. Associated symptoms include weakness. Pertinent negatives for diabetes include no chest pain and no fatigue. Current diabetic treatments: Glipizide  and Jardiance .  CVA - in March, now home with PTx. He has bilateral CAS and needs VS consult. Also has Afib and needs Cardiology consult.  Review of Systems  Constitutional:  Positive for appetite change. Negative for fatigue and unexpected weight change.  HENT:  Negative for nosebleeds.   Eyes:  Negative for visual disturbance.  Respiratory:  Negative for cough, chest tightness, shortness of breath and wheezing.   Cardiovascular:  Negative for chest pain, palpitations and leg swelling.  Gastrointestinal:  Negative for abdominal pain, constipation and diarrhea.  Musculoskeletal:  Positive for gait problem (able to stand and pivot or walk a few steps holding the counter).  Neurological:  Positive for weakness. Negative for dizziness, light-headedness and headaches.  Psychiatric/Behavioral:  Negative for dysphoric mood and sleep disturbance. The patient is not nervous/anxious.      Lab Results  Component Value Date   NA 138 04/15/2024   K 3.9 04/15/2024   CO2 23 04/15/2024   GLUCOSE 137 (H) 04/15/2024   BUN 30 (H) 04/15/2024   CREATININE 1.84 (H) 04/15/2024   CALCIUM  9.3 04/15/2024   EGFR 40 06/11/2023   GFRNONAA 39 (L) 04/15/2024   Lab Results  Component Value Date   CHOL 122 01/16/2023   HDL 41 01/16/2023   LDLCALC 59 01/16/2023   TRIG 108 01/16/2023   CHOLHDL  4.2 10/09/2015   No results found for: TSH Lab Results  Component Value Date   HGBA1C 5.7 (A) 04/29/2024   Lab Results  Component Value Date   WBC 9.8 04/15/2024   HGB 17.4 (H) 04/15/2024   HCT 52.4 (H) 04/15/2024   MCV 89.9 04/15/2024   PLT 332 04/15/2024   Lab Results  Component Value Date   ALT 15 04/15/2024   AST 19 04/15/2024   ALKPHOS 104 04/15/2024   BILITOT 0.7 04/15/2024   No results found for: MARIEN BOLLS, VD25OH   Patient Active Problem List   Diagnosis Date Noted   Ambulatory dysfunction 01/27/2024   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) 01/27/2024   Paroxysmal atrial fibrillation (HCC) 01/27/2024   Carotid artery stenosis 01/27/2024   Aortic atherosclerosis (HCC) 01/25/2024   Facial weakness following cerebrovascular accident (CVA) 11/02/2023   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 11/02/2023   Oropharyngeal dysphagia 11/02/2023   Long term current use of anticoagulant therapy 11/02/2023   History of ischemic stroke 10/25/2023   Acquired thrombophilia (HCC) 06/08/2023   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   Neuropathy 03/04/2023   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   History of Bell's palsy    Tobacco abuse 01/27/2017   Seborrhea 10/01/2015   Type II diabetes mellitus with complication (HCC) 03/16/2015   Essential hypertension 03/16/2015    Allergies  Allergen Reactions   Penicillin G Other (See Comments)    Past Surgical History:  Procedure Laterality Date   TONSILLECTOMY  Social History   Tobacco Use   Smoking status: Every Day    Current packs/day: 0.25    Average packs/day: 0.4 packs/day for 88.7 years (32.2 ttl pk-yrs)    Types: Cigarettes    Start date: 23   Smokeless tobacco: Never  Vaping Use   Vaping status: Never Used  Substance Use Topics   Alcohol use: Not Currently   Drug use: No     Medication list has been  reviewed and updated.  Current Meds  Medication Sig   apixaban  (ELIQUIS ) 5 MG TABS tablet Take 1 tablet (5 mg total) by mouth 2 (two) times daily.   B Complex Vitamins (VITAMIN B-COMPLEX) TABS Take 1 tablet by mouth daily.   cyanocobalamin 1000 MCG tablet Take by mouth daily.   empagliflozin  (JARDIANCE ) 10 MG TABS tablet Take 10 mg by mouth daily.   glipiZIDE  (GLUCOTROL  XL) 2.5 MG 24 hr tablet TAKE 1 TABLET BY MOUTH EVERY DAY WITH BREAKFAST   [DISCONTINUED] atorvastatin  (LIPITOR) 20 MG tablet Take 1 tablet by mouth daily.   [DISCONTINUED] lisinopril  (ZESTRIL ) 40 MG tablet Take 40 mg by mouth daily.       01/27/2024    3:28 PM 09/23/2023    3:02 PM 06/08/2023    3:03 PM 03/04/2023    1:56 PM  GAD 7 : Generalized Anxiety Score  Nervous, Anxious, on Edge 0 0 0 0  Control/stop worrying 0 0 0 0  Worry too much - different things 0 0 0 0  Trouble relaxing 0 0 0 0  Restless 0 0 0 0  Easily annoyed or irritable 0 0 0 0  Afraid - awful might happen 0 0 0 0  Total GAD 7 Score 0 0 0 0  Anxiety Difficulty Not difficult at all Not difficult at all Not difficult at all Not difficult at all       04/29/2024    2:20 PM 04/15/2024   12:07 PM 01/27/2024    3:28 PM  Depression screen PHQ 2/9  Decreased Interest 0 0 0  Down, Depressed, Hopeless 0 0 0  PHQ - 2 Score 0 0 0  Altered sleeping   0  Tired, decreased energy   0  Change in appetite   0  Feeling bad or failure about yourself    0  Trouble concentrating   0  Moving slowly or fidgety/restless   0  Suicidal thoughts   0  PHQ-9 Score   0  Difficult doing work/chores   Not difficult at all    BP Readings from Last 3 Encounters:  04/29/24 98/62  04/15/24 113/80  01/27/24 120/74    Physical Exam Vitals and nursing note reviewed.  Constitutional:      General: He is not in acute distress.    Appearance: He is well-developed.  HENT:     Head: Normocephalic and atraumatic.  Cardiovascular:     Rate and Rhythm: Normal rate. Rhythm  irregular.     Heart sounds: No murmur heard. Pulmonary:     Effort: Pulmonary effort is normal. No respiratory distress.     Breath sounds: No wheezing or rhonchi.  Musculoskeletal:     Cervical back: Normal range of motion.     Right lower leg: Edema present.     Left lower leg: Edema present.  Lymphadenopathy:     Cervical: No cervical adenopathy.  Skin:    General: Skin is warm and dry.     Findings: No rash.  Neurological:  Mental Status: He is alert and oriented to person, place, and time.     Motor: Weakness present.     Gait: Gait abnormal (unable to walk; in wheel chair).     Comments: Mild weakness LUE Mild- moderate weakness LLE Left sided facial droop  Psychiatric:        Mood and Affect: Mood normal.        Behavior: Behavior normal.     Wt Readings from Last 3 Encounters:  04/15/24 172 lb (78 kg)  01/27/24 184 lb (83.5 kg)  09/23/23 201 lb 1.6 oz (91.2 kg)    BP 98/62   Pulse 88   SpO2 97%   Assessment and Plan:  Problem List Items Addressed This Visit       Unprioritized   Ambulatory dysfunction (Chronic)   Mostly in wheelchair but can stand and take a few steps with support. Would like to continue physical therapy - will refer to out patient.      Carotid artery stenosis (Chronic)   Continue Eliquis . Will refer to VS for follow up care of >70% left ICA stenosis Continue Atorvastatin .      Relevant Medications   lisinopril  (ZESTRIL ) 40 MG tablet   atorvastatin  (LIPITOR) 20 MG tablet   Other Relevant Orders   Ambulatory referral to Vascular Surgery   Essential hypertension - Primary (Chronic)   Blood pressure is well controlled on lisinopril .  He never took amlodipine prescribed after CVA so will not refill. No medication side effects noted. Plan to continue current medications.       Relevant Medications   lisinopril  (ZESTRIL ) 40 MG tablet   atorvastatin  (LIPITOR) 20 MG tablet   Facial weakness following cerebrovascular accident  (CVA)   Stable and not interfering with speech or po intake      History of ischemic stroke   Relevant Orders   Ambulatory referral to Physical Therapy   Paroxysmal atrial fibrillation (HCC) (Chronic)   He continues on Eliquis  will minor bleeding issues. Rate controlled but irregular rate by exam. Will refer to Cardiology for further management.      Relevant Medications   lisinopril  (ZESTRIL ) 40 MG tablet   atorvastatin  (LIPITOR) 20 MG tablet   Other Relevant Orders   Ambulatory referral to Cardiology   Type II diabetes mellitus with complication (HCC) (Chronic)   Blood sugars have been stable.  No hypoglycemic events since last visit. Currently medications are glipizide  and jardiance . Last visit medical regimen changes were none. Lab Results  Component Value Date   HGBA1C 6.0 10/22/2023  A1C today = 5.7       Relevant Medications   lisinopril  (ZESTRIL ) 40 MG tablet   atorvastatin  (LIPITOR) 20 MG tablet   Other Relevant Orders   POCT glycosylated hemoglobin (Hb A1C) (Completed)   Other Visit Diagnoses       Long term current use of oral hypoglycemic drug           Return in about 4 months (around 08/29/2024) for [facility] - , DM, HTN  Dr. LOIS Leita HILARIO Justus, MD [facility] [place]

## 2024-04-29 NOTE — Assessment & Plan Note (Signed)
 Blood pressure is well controlled on lisinopril .  He never took amlodipine prescribed after CVA so will not refill. No medication side effects noted. Plan to continue current medications.

## 2024-05-04 DIAGNOSIS — I69354 Hemiplegia and hemiparesis following cerebral infarction affecting left non-dominant side: Secondary | ICD-10-CM | POA: Diagnosis not present

## 2024-05-05 ENCOUNTER — Ambulatory Visit: Attending: Internal Medicine | Admitting: Physical Therapy

## 2024-05-05 DIAGNOSIS — I63531 Cerebral infarction due to unspecified occlusion or stenosis of right posterior cerebral artery: Secondary | ICD-10-CM | POA: Diagnosis not present

## 2024-05-05 DIAGNOSIS — Z8673 Personal history of transient ischemic attack (TIA), and cerebral infarction without residual deficits: Secondary | ICD-10-CM | POA: Insufficient documentation

## 2024-05-05 DIAGNOSIS — R6889 Other general symptoms and signs: Secondary | ICD-10-CM | POA: Insufficient documentation

## 2024-05-05 DIAGNOSIS — M6281 Muscle weakness (generalized): Secondary | ICD-10-CM | POA: Insufficient documentation

## 2024-05-05 DIAGNOSIS — R2689 Other abnormalities of gait and mobility: Secondary | ICD-10-CM | POA: Insufficient documentation

## 2024-05-05 DIAGNOSIS — Z7409 Other reduced mobility: Secondary | ICD-10-CM | POA: Diagnosis not present

## 2024-05-05 NOTE — Therapy (Unsigned)
 OUTPATIENT PHYSICAL THERAPY NEURO EVALUATION   Patient Name: Corey Howard MRN: [id number] DOB:01/02/1951, 73 y.o., male Today's Date: 05/05/2024  REFERRING PROVIDER: Justus Leita DEL, MD  END OF SESSION:  PT End of Session - 05/05/24 1553     Visit Number 1    Number of Visits 16    Date for Recertification  06/30/24    PT Start Time 1030    PT Stop Time 1116    PT Time Calculation (min) 46 min    Equipment Utilized During Treatment Gait belt    Activity Tolerance Patient tolerated treatment well;Patient limited by fatigue    Behavior During Therapy WFL for tasks assessed/performed         Past Medical History:  Diagnosis Date   Diabetes mellitus without complication (HCC)    DVT (deep venous thrombosis) (HCC)    History of DVT (deep vein thrombosis)    Hyperlipidemia    Past Surgical History:  Procedure Laterality Date   TONSILLECTOMY     Patient Active Problem List   Diagnosis Date Noted   Ambulatory dysfunction 01/27/2024   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) 01/27/2024   Paroxysmal atrial fibrillation (HCC) 01/27/2024   Carotid artery stenosis 01/27/2024   Aortic atherosclerosis 01/25/2024   Facial weakness following cerebrovascular accident (CVA) 11/02/2023   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 11/02/2023   Oropharyngeal dysphagia 11/02/2023   Long term current use of anticoagulant therapy 11/02/2023   History of ischemic stroke 10/25/2023   Acquired thrombophilia 06/08/2023   Antiphospholipid antibody syndrome 03/04/2023   Neuropathy 03/04/2023   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   History of Bell's palsy    Tobacco abuse 01/27/2017   Seborrhea 10/01/2015   Type II diabetes mellitus with complication (HCC) 03/16/2015   Essential hypertension 03/16/2015   ONSET DATE: March 13th, 2025 (date of CVA)  REFERRING DIAG: Z86.73 (ICD-10-CM) - History of ischemic stroke   THERAPY  DIAG:  Muscle weakness (generalized)  Cerebrovascular accident (CVA) due to occlusion of right posterior cerebral artery (HCC)  Other abnormalities of gait and mobility  Decreased independence with transfers  Unable to perform basic transfers independently  Rationale for Evaluation and Treatment: Rehabilitation  SUBJECTIVE:                                                                                                                                                                                             SUBJECTIVE STATEMENT: Pt is a 73 year old male presenting to physical therapy with mobility concerns s/p ischemic (multifocal, R occipital) stroke on March 13th,  2025. Pt reports left sided strength deficits affecting his mobility. Pt currently using a manual WC to get around and relies on his friend/roommate/tenant and a caretaker (who comes in the morning and at night) for assistance with transfers, feeding, dressing, and standing. Pt would like to increase his strength so that he can return to walking, which he states that he was able to do with use of SPC prior to stroke.   PERTINENT HISTORY: Pt had physical therapy for one month prior but came to clinic because of access to // bars with goals of walking. Pt has previous history of DVT, stage III CKD, type II DM, and HTN.   PAIN:  Are you having pain? No  PRECAUTIONS: None  RED FLAGS: None   WEIGHT BEARING RESTRICTIONS: No  FALLS: Has patient fallen in last 6 months? No  LIVING ENVIRONMENT: Lives with: his friend/tenant  Lives in: House/apartment Stairs: Yes: External: 2 steps; can reach both Has following equipment at home: Single point cane, Walker - 2 wheeled, Wheelchair (manual), and bed side commode  PLOF: Independent with basic ADLs, Independent with household mobility with device, and Independent with community mobility without device  PATIENT GOALS: To increase LE strength in order to be able to walk with  SPC  OBJECTIVE:  Note: Objective measures were completed at Evaluation unless otherwise noted.  DIAGNOSTIC FINDINGS: N/A  COGNITION: Overall cognitive status: Within functional limits for tasks assessed   SENSATION: Light touch: WFL (upper and lower dermatomes WNL)  COORDINATION: Finger to nose: impaired  Heel to shin: WFL (LLE limited by strength)  Rapid alternating movements: Alternating heel taps: WFL Alternating hand turns: WFL (limited by LUE strength)  MUSCLE LENGTH: Knee flexion contractures noted bilaterally   POSTURE: rounded shoulders, forward head, increased thoracic kyphosis, and weight shift right  UPPER EXTREMITY ROM:     Active  Right Eval Left Eval  Shoulder abduction 114 deg 78 deg   LOWER EXTREMITY MMT:    MMT Right Eval Left Eval  Hip flexion 4- 3  Hip extension    Hip abduction 4- 3  Hip adduction 4- 4-  Hip internal rotation    Hip external rotation    Knee flexion 3+ 3+  Knee extension 4 3  Ankle dorsiflexion 4- 3+  Ankle plantarflexion    Ankle inversion    Ankle eversion    (Blank rows = not tested)  UPPER EXTREMITY MMT: MMT Right eval Left eval  Shoulder flexion 4 3  Shoulder extension    Shoulder abduction 4- 3  Shoulder adduction    Shoulder extension    Shoulder internal rotation 4 3  Shoulder external rotation 4 3  Middle trapezius    Lower trapezius    Elbow flexion 4 3+  Elbow extension 4- 3  Wrist flexion    Wrist extension    Wrist ulnar deviation    Wrist radial deviation    Wrist pronation 4+ 4-  Wrist supination 4+ 4-  Grip strength 38.5# 47.2#   (Blank rows = not tested)   BED MOBILITY:  Findings: Sit to supine Complete Independence Supine to sit CGA  TRANSFERS: Sit to stand: Mod A  Assistive device utilized: Wheelchair (manual)     Stand to sit: CGA  Assistive device utilized: Grab bars      GAIT: Findings: Gait Characteristics: step to pattern, decreased step length- Left, decreased stance  time- Left, decreased stride length, decreased hip/knee flexion- Left, decreased ankle dorsiflexion- Left, trunk flexed, narrow BOS,  and poor foot clearance- Left, Distance walked: 40 ft in // bars, Assistive device utilized:// bars, Level of assistance: Min A, and Comments: Required verbal and tactile cueing keep trunk upright and prevent LLE from adducting past midline  FUNCTIONAL TESTS:  Will assess next session  PATIENT SURVEYS:  Will assess next session                                                                                                                              TREATMENT DATE: 05/05/2024   Therapeutic Exercise: Seated marches 2x10 Seated LAQs 2x10 Seated ankle pumps 2x10  Therapeutic Activity:  Forward and backwards walking in // bars, 3 passes down and back, CGA and Min A from therapist   PATIENT EDUCATION: Education details: Pt educated on diagnosis, prognosis, HEP, and POC Person educated: Patient Education method: Programmer, multimedia, Demonstration, Tactile cues, and Verbal cues Education comprehension: verbalized understanding and returned demonstration  HOME EXERCISE PROGRAM: Seated marches 2x10, Seated LAQs 2x10, Seated ankle pumps 2x10  GOALS: Goals reviewed with patient? Yes  SHORT TERM GOALS: Target date: 06/02/2024  Pt to improve gross LUE strength to at least a 3+/5 in order to achieve greater independence with functional transfers.  Baseline:see above Goal status: INITIAL  2.  Pt to improve left shoulder elevation AROM to at least 105 degrees to become symmetrical with RUE and have greater ease with reaching objects at home.  Baseline: left shoulder AROM: 78 deg Goal status: INITIAL  LONG TERM GOALS: Target date: 06/30/2024  Pt to be able to ambulate household distances (at least 300 ft) with use of RW and no more than CGA from therapist so that he can become independent with daily ADLs.  Baseline: nonambulatory with use of manual WC Goal status:  INITIAL  2.  Pt to improve gross LLE strength to at least a 3+/5 in order to assist with forward ambulation in // bars or with RW so pt can become more independent with daily ADLs.  Baseline: see above Goal status: INITIAL  3.  Pt will require no greater than CGA with sit to stand transfers so that he is able to safely sit to stand at home with less assistance from his caretaker.  Baseline: mod A Goal status: INITIAL  ASSESSMENT:  CLINICAL IMPRESSION: Patient is a 73 y.o. male who was seen today for physical therapy evaluation and treatment with concerns of general mobility and muscular weakness preventing him from being able to complete transfers, daily tasks, or ambulate safely and independently at home. Pt has current impairments with left sided hemiplegia, coordination, tolerance to activity, and bilateral knee flexion contractures leading to limitations preventing independence with functional transfers, daily ADLs, and household mobility. Pt will benefit from skilled physical therapy intervention 2x a week for 8 weeks in order to address impairments listed above and to maximize functional independence.   OBJECTIVE IMPAIRMENTS: Abnormal gait, decreased activity tolerance, decreased balance, decreased coordination, decreased endurance, decreased mobility, difficulty  walking, decreased ROM, decreased strength, decreased safety awareness, impaired flexibility, and impaired UE functional use.   ACTIVITY LIMITATIONS: carrying, lifting, bending, standing, squatting, stairs, transfers, bathing, toileting, dressing, self feeding, reach over head, hygiene/grooming, and locomotion level  PARTICIPATION LIMITATIONS: meal prep, cleaning, laundry, driving, shopping, community activity, and yard work  PERSONAL FACTORS: Age, Fitness, Transportation, and 3+ comorbidities: T2DM, A-fib, HTN, Stage III CKD are also affecting patient's functional outcome.   REHAB POTENTIAL: Good  CLINICAL DECISION MAKING:  Evolving/moderate complexity  EVALUATION COMPLEXITY: High  PLAN:  PT FREQUENCY: 2x/week  PT DURATION: 8 weeks  PLANNED INTERVENTIONS: 97164- PT Re-evaluation, 97110-Therapeutic exercises, 97530- Therapeutic activity, 97112- Neuromuscular re-education, 97535- Self Care, [id number]- Manual therapy, Patient/Family education, DME instructions, and Wheelchair mobility training  PLAN FOR NEXT SESSION: Measure bilateral shoulder flexion and knee ROM, functional outcome measures, continue // gait training.   Corey Howard, PT, DPT # [id number] Corey Howard, Student-PT 05/05/2024, 5:57 PM

## 2024-05-06 ENCOUNTER — Encounter: Payer: Self-pay | Admitting: Physical Therapy

## 2024-05-08 DIAGNOSIS — I639 Cerebral infarction, unspecified: Secondary | ICD-10-CM | POA: Diagnosis not present

## 2024-05-09 ENCOUNTER — Ambulatory Visit: Admitting: Physical Therapy

## 2024-05-09 DIAGNOSIS — M6281 Muscle weakness (generalized): Secondary | ICD-10-CM | POA: Diagnosis not present

## 2024-05-09 DIAGNOSIS — R6889 Other general symptoms and signs: Secondary | ICD-10-CM

## 2024-05-09 DIAGNOSIS — Z7409 Other reduced mobility: Secondary | ICD-10-CM

## 2024-05-09 DIAGNOSIS — I63531 Cerebral infarction due to unspecified occlusion or stenosis of right posterior cerebral artery: Secondary | ICD-10-CM

## 2024-05-09 DIAGNOSIS — R2689 Other abnormalities of gait and mobility: Secondary | ICD-10-CM

## 2024-05-09 NOTE — Therapy (Signed)
 OUTPATIENT PHYSICAL THERAPY NEURO TREATMENT  Patient Name: Corey Howard MRN: [id number] DOB:[birth year], 73 y.o., male Today's Date: 05/09/2024  REFERRING PROVIDER: Justus Leita DEL, MD  END OF SESSION:  PT End of Session - 05/09/24 1336     Visit Number 2    Number of Visits 16    Date for Recertification  06/30/24    PT Start Time 1336    PT Stop Time 1428    PT Time Calculation (min) 52 min    Equipment Utilized During Treatment Gait belt    Activity Tolerance Patient tolerated treatment well;Patient limited by fatigue;No increased pain    Behavior During Therapy WFL for tasks assessed/performed         Past Medical History:  Diagnosis Date   Diabetes mellitus without complication (HCC)    DVT (deep venous thrombosis) (HCC)    History of DVT (deep vein thrombosis)    Hyperlipidemia    Past Surgical History:  Procedure Laterality Date   TONSILLECTOMY     Patient Active Problem List   Diagnosis Date Noted   Ambulatory dysfunction 01/27/2024   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) 01/27/2024   Paroxysmal atrial fibrillation (HCC) 01/27/2024   Carotid artery stenosis 01/27/2024   Aortic atherosclerosis 01/25/2024   Facial weakness following cerebrovascular accident (CVA) 11/02/2023   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 11/02/2023   Oropharyngeal dysphagia 11/02/2023   Long term current use of anticoagulant therapy 11/02/2023   History of ischemic stroke 10/25/2023   Acquired thrombophilia 06/08/2023   Antiphospholipid antibody syndrome 03/04/2023   Neuropathy 03/04/2023   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   History of Bell's palsy    Tobacco abuse 01/27/2017   Seborrhea 10/01/2015   Type II diabetes mellitus with complication (HCC) 03/16/2015   Essential hypertension 03/16/2015   ONSET DATE: March 13th, 2025 (date of CVA)  REFERRING DIAG: Z34.73 (ICD-10-CM) - History of ischemic  stroke   THERAPY DIAG:  Muscle weakness (generalized)  Other abnormalities of gait and mobility  Cerebrovascular accident (CVA) due to occlusion of right posterior cerebral artery (HCC)  Decreased independence with transfers  Unable to perform basic transfers independently  Rationale for Evaluation and Treatment: Rehabilitation  SUBJECTIVE:                                                                                                                                                                                             SUBJECTIVE STATEMENT: Pt is a 73 year old male presenting to physical therapy with mobility concerns s/p ischemic (multifocal, R occipital) stroke on March  13th, 2025. Pt reports left sided strength deficits affecting his mobility. Pt currently using a manual WC to get around and relies on his friend/roommate/tenant and a caretaker (who comes in the morning and at night) for assistance with transfers, feeding, dressing, and standing. Pt would like to increase his strength so that he can return to walking, which he states that he was able to do with use of SPC prior to stroke.   PERTINENT HISTORY: Pt had physical therapy for one month prior but came to clinic because of access to // bars with goals of walking. Pt has previous history of DVT, stage III CKD, type II DM, and HTN.   PAIN:  Are you having pain? No  PRECAUTIONS: None  RED FLAGS: None   WEIGHT BEARING RESTRICTIONS: No  FALLS: Has patient fallen in last 6 months? No  LIVING ENVIRONMENT: Lives with: his friend/tenant  Lives in: House/apartment Stairs: Yes: External: 2 steps; can reach both Has following equipment at home: Single point cane, Walker - 2 wheeled, Wheelchair (manual), and bed side commode  PLOF: Independent with basic ADLs, Independent with household mobility with device, and Independent with community mobility without device  PATIENT GOALS: To increase LE strength in order to be able  to walk with SPC  OBJECTIVE:  Note: Objective measures were completed at Evaluation unless otherwise noted.  DIAGNOSTIC FINDINGS: N/A  COGNITION: Overall cognitive status: Within functional limits for tasks assessed   SENSATION: Light touch: WFL (upper and lower dermatomes WNL)  COORDINATION: Finger to nose: impaired  Heel to shin: WFL (LLE limited by strength)  Rapid alternating movements: Alternating heel taps: WFL Alternating hand turns: WFL (limited by LUE strength)  MUSCLE LENGTH: Knee flexion contractures noted bilaterally   POSTURE: rounded shoulders, forward head, increased thoracic kyphosis, and weight shift right  UPPER EXTREMITY ROM:     Active  Right Eval Left Eval  Shoulder abduction 114 deg 78 deg   LOWER EXTREMITY MMT:    MMT Right Eval Left Eval  Hip flexion 4- 3  Hip extension    Hip abduction 4- 3  Hip adduction 4- 4-  Hip internal rotation    Hip external rotation    Knee flexion 3+ 3+  Knee extension 4 3  Ankle dorsiflexion 4- 3+  Ankle plantarflexion    Ankle inversion    Ankle eversion    (Blank rows = not tested)  UPPER EXTREMITY MMT: MMT Right eval Left eval  Shoulder flexion 4 3  Shoulder extension    Shoulder abduction 4- 3  Shoulder adduction    Shoulder extension    Shoulder internal rotation 4 3  Shoulder external rotation 4 3  Middle trapezius    Lower trapezius    Elbow flexion 4 3+  Elbow extension 4- 3  Wrist flexion    Wrist extension    Wrist ulnar deviation    Wrist radial deviation    Wrist pronation 4+ 4-  Wrist supination 4+ 4-  Grip strength 38.5# 47.2#   (Blank rows = not tested)   BED MOBILITY:  Findings: Sit to supine Complete Independence Supine to sit CGA  TRANSFERS: Sit to stand: Mod A  Assistive device utilized: Wheelchair (manual)     Stand to sit: CGA  Assistive device utilized: Grab bars      GAIT: Findings: Gait Characteristics: step to pattern, decreased step length- Left,  decreased stance time- Left, decreased stride length, decreased hip/knee flexion- Left, decreased ankle dorsiflexion- Left, trunk flexed, narrow  BOS, and poor foot clearance- Left, Distance walked: 40 ft in // bars, Assistive device utilized:// bars, Level of assistance: Min A, and Comments: Required verbal and tactile cueing keep trunk upright and prevent LLE from adducting past midline  FUNCTIONAL TESTS:  Will assess next session  PATIENT SURVEYS:  Will assess next session                                                                                                                              TREATMENT DATE: 05/09/2024  Subjective: Pt reports adherence to his HEP over the weekend and believes that he is able to assist more with basic transfers at home. Pt denies any falls or pain at start of tx session.   Therapeutic Exercise: Seated marches 2x10 Seated LAQs 2x10 Seated ankle pumps 2x10  Therapeutic Activity:  Forward walking in // bars, 6 passes down and back, CGA and Min A from therapist for maintaining balance, safe turns, and tactile cues to keep left hip from adducting past midline  Lateral walking in // bars, 2 passes down and back, CGA from therapist for maintaining balance and verbal cues to maintain upright posture  Sit to stands from manual wheelchair, 1x10 CGA from therapist for controlled descent  Car transfer to passenger side seat with min A from therapist to pivot turn and raise LLE into car  05/09/24 Left Knee extension: -2 deg actively, 0 deg passively Right Knee extension: -9 deg actively, -1 deg passively Left hip flexion AROM: 117 deg Right hip flexion AROM: 113 deg Left shoulder flexion AROM: 130 deg Right shoulder flexion AROM: WNL Left shoulder abduction AROM: 117 deg Right shoulder abduction AROM: WNL  PATIENT EDUCATION: Education details: Pt educated on diagnosis, prognosis, HEP, and POC Person educated: Patient Education method: Programmer, multimedia,  Demonstration, Tactile cues, and Verbal cues Education comprehension: verbalized understanding and returned demonstration  HOME EXERCISE PROGRAM: Seated marches 2x10, Seated LAQs 2x10, Seated ankle pumps 2x10  GOALS: Goals reviewed with patient? Yes  SHORT TERM GOALS: Target date: 06/02/2024  Pt to improve gross LUE strength to at least a 3+/5 in order to achieve greater independence with functional transfers.  Baseline:see above Goal status: INITIAL  2.  Pt to improve left shoulder elevation AROM to at least 105 degrees to become symmetrical with RUE and have greater ease with reaching objects at home.  Baseline: left shoulder AROM: 78 deg Goal status: INITIAL  LONG TERM GOALS: Target date: 06/30/2024  Pt to be able to ambulate household distances (at least 300 ft) with use of RW and no more than CGA from therapist so that he can become independent with daily ADLs.  Baseline: nonambulatory with use of manual WC Goal status: INITIAL  2.  Pt to improve gross LLE strength to at least a 3+/5 in order to assist with forward ambulation in // bars or with RW so pt can become more independent with daily ADLs.  Baseline: see above Goal  status: INITIAL  3.  Pt will require no greater than CGA with sit to stand transfers so that he is able to safely sit to stand at home with less assistance from his caretaker.  Baseline: mod A Goal status: INITIAL  ASSESSMENT:  CLINICAL IMPRESSION: Pt presents to physical therapy with no pain. Pt was introduced to lateral walking in // bars which he was able to complete but required verbal cueing to maintain upright posture. Pt did report significant bilateral hip fatigue with left>right. Pt required verbal and tactile cueing to keep LLE from adducting past midline during forward walking in // bars but demonstrated improved tolerance to activity with less frequent seated rest breaks required. Pt required frequent verbal cueing to control eccentric descent  during sit to stands and was able to correct for majority of reps but did require CGA from therapist to ensure safety. Pt transfers to car by pulling from passenger side door rather than pushing up through triceps off wheelchair secondary to gross UE strength deficits. Will continue to monitor and progress as able.   OBJECTIVE IMPAIRMENTS: Abnormal gait, decreased activity tolerance, decreased balance, decreased coordination, decreased endurance, decreased mobility, difficulty walking, decreased ROM, decreased strength, decreased safety awareness, impaired flexibility, and impaired UE functional use.   ACTIVITY LIMITATIONS: carrying, lifting, bending, standing, squatting, stairs, transfers, bathing, toileting, dressing, self feeding, reach over head, hygiene/grooming, and locomotion level  PARTICIPATION LIMITATIONS: meal prep, cleaning, laundry, driving, shopping, community activity, and yard work  PERSONAL FACTORS: Age, Fitness, Transportation, and 3+ comorbidities: T2DM, A-fib, HTN, Stage III CKD are also affecting patient's functional outcome.   REHAB POTENTIAL: Good  CLINICAL DECISION MAKING: Evolving/moderate complexity  EVALUATION COMPLEXITY: High  PLAN:  PT FREQUENCY: 2x/week  PT DURATION: 8 weeks  PLANNED INTERVENTIONS: 97164- PT Re-evaluation, 97110-Therapeutic exercises, 97530- Therapeutic activity, 97112- Neuromuscular re-education, 97535- Self Care, 02859- Manual therapy, Patient/Family education, DME instructions, and Wheelchair mobility training  PLAN FOR NEXT SESSION: Continue transfer training with focus on UE pushoff rather than pulling/reaching.   Ozell JAYSON Sero, PT, DPT # [id number] Curtistine Bracket, SPT 05/09/2024, 6:18 PM

## 2024-05-12 ENCOUNTER — Telehealth: Payer: Self-pay | Admitting: *Deleted

## 2024-05-12 ENCOUNTER — Other Ambulatory Visit: Payer: Self-pay

## 2024-05-12 ENCOUNTER — Telehealth: Payer: Self-pay | Admitting: Pharmacy Technician

## 2024-05-12 DIAGNOSIS — I82512 Chronic embolism and thrombosis of left femoral vein: Secondary | ICD-10-CM

## 2024-05-12 MED ORDER — APIXABAN 5 MG PO TABS
5.0000 mg | ORAL_TABLET | Freq: Two times a day (BID) | ORAL | 3 refills | Status: AC
  Filled 2024-05-12: qty 60, 30d supply, fill #0
  Filled 2024-06-17: qty 60, 30d supply, fill #1

## 2024-05-12 NOTE — Telephone Encounter (Signed)
 He says that this month he cannot afford the Eliquis 

## 2024-05-12 NOTE — Progress Notes (Signed)
 Received secure chat Hey Jamarie Mussa caretaker is here and is asking for some meds.  Reviewed per telephone note dated 04/22/24 from [facility] indicating Patient called about a medication however when I called him back he said that he has 60 pills of the Eliquis  and he did not know that so I told him when he gets down to 2 days before needing it then he could call us  and then we can get it set up for him.  He is agreeable for that. Per Dr. Darold note dated 04/15/24 He will continue with Eliquis  5 mg twice daily. .  Medication last sent [facility] [place] but caretaker reports she said he doesn't have the money to pay for it.   Outbound call to [facility] pharmacy, spoke with Duwaine who indicated with eliquis  there is a deductible he would have to meet.  She will speak to manager to see if a bill can be sent and he can pay it when he can. Transferred to [employer] unfortunately with Medicare they are very limited to put things on an account and pay over time.  Options this late in the year (1)he can talk to [employer] and they do have to where they can put their premiums on an account with [employer] and breaks up the payment over oct, nov, & December aka Medicare payment plan.  May not be as helpful this late in the year.  Initial benefit is $556; mentioned Medicare has changed in 2025, he hasn't met it yet but after he pays this month it will go back down to his regular copay which will be between $40, $45 and $47.  There is a free trial of Eliquis  (once in a lifetime) already used in 2023 (it's once in a lifetime).  Hey may qualify for the low income subsidy with Medicare that reduces copay to about $11; Roxie mentioned getting in touch with Dickey Pap to possibly assist with this.  Per Roxie Cos via secure chat ask Dickey Fritter about assistance and potentially the Low Income Subsidy (LIS) to se if he qualifies (150% or less of Federal Poverty Limit), he can also talk to his insurance  about the [facility] payment plan, but that would only be until Decemeber. Also, look likes after he pays the $141 he would be out of the initial benefit phase and copays would be his normal copay (between $40-47) next fill.  Informed caregiver Andrea Apgar of above. Secure chatted with Roxie Cos, one month supply would be $142; Melanie requested medication be sent to [facility] pharmacy.  Per Andrea patient's best contact number is [phone].    Received response from Dickey Pap via secure chat indicating I can assist patient with applying, however, the decision is made by Medicare and it could take up to 4 weeks before a determination is made for the L.I.S..    Outbound call to patient [phone] discussed the above. Patient was happy to hear the one month supply cost would be $142 and then copay will range from $40-47 for future refills for this medication.

## 2024-05-12 NOTE — Telephone Encounter (Signed)
 Attempted to contact patient to discuss L.I.S. and assistance with obtaining Eliquis .  Unable to reach.  Left voicemail message for patient to contact me.  Dickey DOROTHA Fritter Patient Pharmacologist [facility]

## 2024-05-13 ENCOUNTER — Ambulatory Visit: Attending: Internal Medicine | Admitting: Physical Therapy

## 2024-05-13 ENCOUNTER — Other Ambulatory Visit: Payer: Self-pay

## 2024-05-13 DIAGNOSIS — I63531 Cerebral infarction due to unspecified occlusion or stenosis of right posterior cerebral artery: Secondary | ICD-10-CM | POA: Insufficient documentation

## 2024-05-13 DIAGNOSIS — R6889 Other general symptoms and signs: Secondary | ICD-10-CM | POA: Diagnosis present

## 2024-05-13 DIAGNOSIS — R2689 Other abnormalities of gait and mobility: Secondary | ICD-10-CM | POA: Insufficient documentation

## 2024-05-13 DIAGNOSIS — Z7409 Other reduced mobility: Secondary | ICD-10-CM | POA: Diagnosis present

## 2024-05-13 DIAGNOSIS — M6281 Muscle weakness (generalized): Secondary | ICD-10-CM | POA: Diagnosis present

## 2024-05-13 NOTE — Therapy (Signed)
 OUTPATIENT PHYSICAL THERAPY NEURO TREATMENT  Patient Name: Corey Howard MRN: [id number] DOB:[birth year], 73 y.o., male Today's Date: 05/13/2024  REFERRING PROVIDER: Justus Leita DEL, MD  END OF SESSION:  PT End of Session - 05/13/24 0847     Visit Number 3    Number of Visits 16    Date for Recertification  06/30/24    PT Start Time 0847    PT Stop Time 0940    PT Time Calculation (min) 53 min    Equipment Utilized During Treatment Gait belt    Activity Tolerance Patient tolerated treatment well;Patient limited by fatigue;No increased pain    Behavior During Therapy WFL for tasks assessed/performed          Past Medical History:  Diagnosis Date   Diabetes mellitus without complication (HCC)    DVT (deep venous thrombosis) (HCC)    History of DVT (deep vein thrombosis)    Hyperlipidemia    Past Surgical History:  Procedure Laterality Date   TONSILLECTOMY     Patient Active Problem List   Diagnosis Date Noted   Ambulatory dysfunction 01/27/2024   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) 01/27/2024   Paroxysmal atrial fibrillation (HCC) 01/27/2024   Carotid artery stenosis 01/27/2024   Aortic atherosclerosis 01/25/2024   Facial weakness following cerebrovascular accident (CVA) 11/02/2023   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 11/02/2023   Oropharyngeal dysphagia 11/02/2023   Long term current use of anticoagulant therapy 11/02/2023   History of ischemic stroke 10/25/2023   Acquired thrombophilia 06/08/2023   Antiphospholipid antibody syndrome 03/04/2023   Neuropathy 03/04/2023   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   History of Bell's palsy    Tobacco abuse 01/27/2017   Seborrhea 10/01/2015   Type II diabetes mellitus with complication (HCC) 03/16/2015   Essential hypertension 03/16/2015   ONSET DATE: March 13th, 2025 (date of CVA)  REFERRING DIAG: Z53.73 (ICD-10-CM) - History of ischemic  stroke   THERAPY DIAG:  Muscle weakness (generalized)  Decreased independence with transfers  Unable to perform basic transfers independently  Cerebrovascular accident (CVA) due to occlusion of right posterior cerebral artery (HCC)  Other abnormalities of gait and mobility  Rationale for Evaluation and Treatment: Rehabilitation  SUBJECTIVE:                                                                                                                                                                                             SUBJECTIVE STATEMENT: Pt is a 73 year old male presenting to physical therapy with mobility concerns s/p ischemic (multifocal, R occipital) stroke on  March 13th, 2025. Pt reports left sided strength deficits affecting his mobility. Pt currently using a manual WC to get around and relies on his friend/roommate/tenant and a caretaker (who comes in the morning and at night) for assistance with transfers, feeding, dressing, and standing. Pt would like to increase his strength so that he can return to walking, which he states that he was able to do with use of SPC prior to stroke.   PERTINENT HISTORY: Pt had physical therapy for one month prior but came to clinic because of access to // bars with goals of walking. Pt has previous history of DVT, stage III CKD, type II DM, and HTN.   PAIN:  Are you having pain? No  PRECAUTIONS: None  RED FLAGS: None   WEIGHT BEARING RESTRICTIONS: No  FALLS: Has patient fallen in last 6 months? No  LIVING ENVIRONMENT: Lives with: his friend/tenant  Lives in: House/apartment Stairs: Yes: External: 2 steps; can reach both Has following equipment at home: Single point cane, Walker - 2 wheeled, Wheelchair (manual), and bed side commode  PLOF: Independent with basic ADLs, Independent with household mobility with device, and Independent with community mobility without device  PATIENT GOALS: To increase LE strength in order to be able  to walk with SPC  OBJECTIVE:  Note: Objective measures were completed at Evaluation unless otherwise noted.  DIAGNOSTIC FINDINGS: N/A  COGNITION: Overall cognitive status: Within functional limits for tasks assessed   SENSATION: Light touch: WFL (upper and lower dermatomes WNL)  COORDINATION: Finger to nose: impaired  Heel to shin: WFL (LLE limited by strength)  Rapid alternating movements: Alternating heel taps: WFL Alternating hand turns: WFL (limited by LUE strength)  MUSCLE LENGTH: Knee flexion contractures noted bilaterally   POSTURE: rounded shoulders, forward head, increased thoracic kyphosis, and weight shift right  UPPER EXTREMITY ROM:     Active  Right Eval Left Eval  Shoulder abduction 114 deg 78 deg   LOWER EXTREMITY MMT:    MMT Right Eval Left Eval  Hip flexion 4- 3  Hip extension    Hip abduction 4- 3  Hip adduction 4- 4-  Hip internal rotation    Hip external rotation    Knee flexion 3+ 3+  Knee extension 4 3  Ankle dorsiflexion 4- 3+  Ankle plantarflexion    Ankle inversion    Ankle eversion    (Blank rows = not tested)  UPPER EXTREMITY MMT: MMT Right eval Left eval  Shoulder flexion 4 3  Shoulder extension    Shoulder abduction 4- 3  Shoulder adduction    Shoulder extension    Shoulder internal rotation 4 3  Shoulder external rotation 4 3  Middle trapezius    Lower trapezius    Elbow flexion 4 3+  Elbow extension 4- 3  Wrist flexion    Wrist extension    Wrist ulnar deviation    Wrist radial deviation    Wrist pronation 4+ 4-  Wrist supination 4+ 4-  Grip strength 38.5# 47.2#   (Blank rows = not tested)   BED MOBILITY:  Findings: Sit to supine Complete Independence Supine to sit CGA  TRANSFERS: Sit to stand: Mod A  Assistive device utilized: Wheelchair (manual)     Stand to sit: CGA  Assistive device utilized: Grab bars      GAIT: Findings: Gait Characteristics: step to pattern, decreased step length- Left,  decreased stance time- Left, decreased stride length, decreased hip/knee flexion- Left, decreased ankle dorsiflexion- Left, trunk flexed,  narrow BOS, and poor foot clearance- Left, Distance walked: 40 ft in // bars, Assistive device utilized:// bars, Level of assistance: Min A, and Comments: Required verbal and tactile cueing keep trunk upright and prevent LLE from adducting past midline  FUNCTIONAL TESTS:  Will assess next session  PATIENT SURVEYS:  Will assess next session  05/09/24 Left Knee extension: -2 deg actively, 0 deg passively Right Knee extension: -9 deg actively, -1 deg passively Left hip flexion AROM: 117 deg Right hip flexion AROM: 113 deg Left shoulder flexion AROM: 130 deg Right shoulder flexion AROM: WNL Left shoulder abduction AROM: 117 deg Right shoulder abduction AROM: WNL                                                                                                                   TREATMENT DATE: 05/13/2024  Subjective: Pt reports adherence to his HEP over the week and is motivated to begin today's tx session. Pt denies any falls or pain at start of tx session.  Pt. Arrived to PT in w/c with caregiver assist.    Therapeutic Exercise: Tricep pushdowns off blue mat table, 2 x 8  Seated weight shifting A/P, 2 x 10 Sit to stands from elevated surface on blue mat table, 10x total  See updated HEP (handouts issued)  Therapeutic Activity:  Nu-Step with BUE assist for control (to improve gross BLE strength, coordination, and promote bilateral knee extension to avoid contractures from prolonged seated position in WC at home)  Forward walking in // bars, 4 passes down and back, CGA and Min A from therapist for maintaining balance, safe turns, and tactile cues to keep left hip from adducting past midline  Lateral walking in // bars, 2 passes down and back, CGA from therapist for maintaining balance and verbal cues to maintain upright posture  Sit to stands from blue  mat table, 3x CGA from therapist for controlled descent  Highest recorded vitals on this day after // bar walking: spO2: 98%, HR: 109 bpm  Issued updated HEP  Not performed today:  Seated marches 2x10 Seated LAQs 2x10 Seated ankle pumps 2x10 Car transfer to passenger side seat with min A from therapist to pivot turn and raise LLE into car  PATIENT EDUCATION: Education details: Pt educated on diagnosis, prognosis, HEP, and POC Person educated: Patient Education method: Programmer, multimedia, Demonstration, Tactile cues, and Verbal cues Education comprehension: verbalized understanding and returned demonstration  HOME EXERCISE PROGRAM: Access Code: [email or web address] URL: [email or web address] Date: 05/13/2024 Prepared by: Ozell Sero Exercises - Seated March - 2 x daily - 7 x weekly - 2 sets - 15 reps - Seated Long Arc Quad - 2 x daily - 7 x weekly - 2 sets - 15 reps - Seated Heel Raise - 2 x daily - 7 x weekly - 2 sets - 15 reps - Seated Elbow Extension with Self-Anchored Resistance - 2 x daily - 7 x weekly - 2 sets - 15 reps - Sit to Stand with Armchair - 1 x daily -  7 x weekly - 3 sets - 10 reps   GOALS: Goals reviewed with patient? Yes  SHORT TERM GOALS: Target date: 06/02/2024  Pt to improve gross LUE strength to at least a 3+/5 in order to achieve greater independence with functional transfers.  Baseline:see above Goal status: INITIAL  2.  Pt to improve left shoulder elevation AROM to at least 105 degrees to become symmetrical with RUE and have greater ease with reaching objects at home.  Baseline: left shoulder AROM: 78 deg Goal status: INITIAL  LONG TERM GOALS: Target date: 06/30/2024  Pt to be able to ambulate household distances (at least 300 ft) with use of RW and no more than CGA from therapist so that he can become independent with daily ADLs.  Baseline: nonambulatory with use of manual WC Goal status: INITIAL  2.  Pt to improve gross LLE strength to at least a 3+/5 in  order to assist with forward ambulation in // bars or with RW so pt can become more independent with daily ADLs.  Baseline: see above Goal status: INITIAL  3.  Pt will require no greater than CGA with sit to stand transfers so that he is able to safely sit to stand at home with less assistance from his caretaker.  Baseline: mod A Goal status: INITIAL  ASSESSMENT:  CLINICAL IMPRESSION: Focus of today's tx session was improving strength and technique for initiation of sit to stand transfers. Pt was introduced to seated A/P weight shifting and tricep pushdowns off mat table surface and manual wheelchair which he was able to practice with verbal and tactile cues. Updated and issued new HEP and patient verbalized and returned demonstration after frequent verbal and tactile cueing for seated elbow extension with self anchored resistance. Initiated Nu-Step on this day which patient tolerated well. Pt with improved ability to ambulate with increased bilateral step length and upright posture in // bars with CGA and Min A from therapist to maintain upright balance. Pt required long duration seated rest breaks to recover from BLE fatigue throughout tx session but remains heavily motivated to participate in tx sessions. Pt with no reports of pain onset at end of tx session but does report general LE fatigue. Will continue to monitor and progress as able.   OBJECTIVE IMPAIRMENTS: Abnormal gait, decreased activity tolerance, decreased balance, decreased coordination, decreased endurance, decreased mobility, difficulty walking, decreased ROM, decreased strength, decreased safety awareness, impaired flexibility, and impaired UE functional use.   ACTIVITY LIMITATIONS: carrying, lifting, bending, standing, squatting, stairs, transfers, bathing, toileting, dressing, self feeding, reach over head, hygiene/grooming, and locomotion level  PARTICIPATION LIMITATIONS: meal prep, cleaning, laundry, driving, shopping,  community activity, and yard work  PERSONAL FACTORS: Age, Fitness, Transportation, and 3+ comorbidities: T2DM, A-fib, HTN, Stage III CKD are also affecting patient's functional outcome.   REHAB POTENTIAL: Good  CLINICAL DECISION MAKING: Evolving/moderate complexity  EVALUATION COMPLEXITY: High  PLAN:  PT FREQUENCY: 2x/week  PT DURATION: 8 weeks  PLANNED INTERVENTIONS: 97164- PT Re-evaluation, 97110-Therapeutic exercises, 97530- Therapeutic activity, 97112- Neuromuscular re-education, 97535- Self Care, [id number]- Manual therapy, Patient/Family education, DME instructions, and Wheelchair mobility training  PLAN FOR NEXT SESSION: Continue transfer training with focus on UE pushoff rand weight shifting anteriorly. Improve general endurance capacity/tolerance to activity.  Ozell JAYSON Sero, PT, DPT # [id number] Curtistine Bracket, SPT 05/13/2024, 1:59 PM

## 2024-05-16 ENCOUNTER — Encounter: Payer: Self-pay | Admitting: Physical Therapy

## 2024-05-16 ENCOUNTER — Ambulatory Visit: Admitting: Physical Therapy

## 2024-05-16 DIAGNOSIS — M6281 Muscle weakness (generalized): Secondary | ICD-10-CM | POA: Diagnosis not present

## 2024-05-16 DIAGNOSIS — R6889 Other general symptoms and signs: Secondary | ICD-10-CM

## 2024-05-16 DIAGNOSIS — Z7409 Other reduced mobility: Secondary | ICD-10-CM

## 2024-05-16 DIAGNOSIS — R2689 Other abnormalities of gait and mobility: Secondary | ICD-10-CM

## 2024-05-16 DIAGNOSIS — I63531 Cerebral infarction due to unspecified occlusion or stenosis of right posterior cerebral artery: Secondary | ICD-10-CM

## 2024-05-16 NOTE — Therapy (Signed)
 OUTPATIENT PHYSICAL THERAPY NEURO TREATMENT  Patient Name: Corey Howard MRN: [id number] DOB:[birth year], 73 y.o., male Today's Date: 05/16/2024  REFERRING PROVIDER: Justus Leita DEL, MD  END OF SESSION:  PT End of Session - 05/16/24 1031     Visit Number 4    Number of Visits 16    Date for Recertification  06/30/24    PT Start Time 1023    PT Stop Time 1114    PT Time Calculation (min) 51 min    Equipment Utilized During Treatment Gait belt    Activity Tolerance Patient tolerated treatment well;Patient limited by fatigue;No increased pain    Behavior During Therapy WFL for tasks assessed/performed          Past Medical History:  Diagnosis Date   Diabetes mellitus without complication (HCC)    DVT (deep venous thrombosis) (HCC)    History of DVT (deep vein thrombosis)    Hyperlipidemia    Past Surgical History:  Procedure Laterality Date   TONSILLECTOMY     Patient Active Problem List   Diagnosis Date Noted   Ambulatory dysfunction 01/27/2024   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) 01/27/2024   Paroxysmal atrial fibrillation (HCC) 01/27/2024   Carotid artery stenosis 01/27/2024   Aortic atherosclerosis 01/25/2024   Facial weakness following cerebrovascular accident (CVA) 11/02/2023   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 11/02/2023   Oropharyngeal dysphagia 11/02/2023   Long term current use of anticoagulant therapy 11/02/2023   History of ischemic stroke 10/25/2023   Acquired thrombophilia 06/08/2023   Antiphospholipid antibody syndrome 03/04/2023   Neuropathy 03/04/2023   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   History of Bell's palsy    Tobacco abuse 01/27/2017   Seborrhea 10/01/2015   Type II diabetes mellitus with complication (HCC) 03/16/2015   Essential hypertension 03/16/2015   ONSET DATE: March 13th, 2025 (date of CVA)  REFERRING DIAG: Z45.73 (ICD-10-CM) - History of ischemic  stroke   THERAPY DIAG:  Muscle weakness (generalized)  Decreased independence with transfers  Unable to perform basic transfers independently  Cerebrovascular accident (CVA) due to occlusion of right posterior cerebral artery (HCC)  Other abnormalities of gait and mobility  Rationale for Evaluation and Treatment: Rehabilitation  SUBJECTIVE:                                                                                                                                                                                             SUBJECTIVE STATEMENT: Pt is a 73 year old male presenting to physical therapy with mobility concerns s/p ischemic (multifocal, R occipital) stroke on  March 13th, 2025. Pt reports left sided strength deficits affecting his mobility. Pt currently using a manual WC to get around and relies on his friend/roommate/tenant and a caretaker (who comes in the morning and at night) for assistance with transfers, feeding, dressing, and standing. Pt would like to increase his strength so that he can return to walking, which he states that he was able to do with use of SPC prior to stroke.   PERTINENT HISTORY: Pt had physical therapy for one month prior but came to clinic because of access to // bars with goals of walking. Pt has previous history of DVT, stage III CKD, type II DM, and HTN.   PAIN:  Are you having pain? No  PRECAUTIONS: None  RED FLAGS: None   WEIGHT BEARING RESTRICTIONS: No  FALLS: Has patient fallen in last 6 months? No  LIVING ENVIRONMENT: Lives with: his friend/tenant  Lives in: House/apartment Stairs: Yes: External: 2 steps; can reach both Has following equipment at home: Single point cane, Walker - 2 wheeled, Wheelchair (manual), and bed side commode  PLOF: Independent with basic ADLs, Independent with household mobility with device, and Independent with community mobility without device  PATIENT GOALS: To increase LE strength in order to be able  to walk with SPC  OBJECTIVE:  Note: Objective measures were completed at Evaluation unless otherwise noted.  DIAGNOSTIC FINDINGS: N/A  COGNITION: Overall cognitive status: Within functional limits for tasks assessed   SENSATION: Light touch: WFL (upper and lower dermatomes WNL)  COORDINATION: Finger to nose: impaired  Heel to shin: WFL (LLE limited by strength)  Rapid alternating movements: Alternating heel taps: WFL Alternating hand turns: WFL (limited by LUE strength)  MUSCLE LENGTH: Knee flexion contractures noted bilaterally   POSTURE: rounded shoulders, forward head, increased thoracic kyphosis, and weight shift right  UPPER EXTREMITY ROM:     Active  Right Eval Left Eval  Shoulder abduction 114 deg 78 deg   LOWER EXTREMITY MMT:    MMT Right Eval Left Eval  Hip flexion 4- 3  Hip extension    Hip abduction 4- 3  Hip adduction 4- 4-  Hip internal rotation    Hip external rotation    Knee flexion 3+ 3+  Knee extension 4 3  Ankle dorsiflexion 4- 3+  Ankle plantarflexion    Ankle inversion    Ankle eversion    (Blank rows = not tested)  UPPER EXTREMITY MMT: MMT Right eval Left eval  Shoulder flexion 4 3  Shoulder extension    Shoulder abduction 4- 3  Shoulder adduction    Shoulder extension    Shoulder internal rotation 4 3  Shoulder external rotation 4 3  Middle trapezius    Lower trapezius    Elbow flexion 4 3+  Elbow extension 4- 3  Wrist flexion    Wrist extension    Wrist ulnar deviation    Wrist radial deviation    Wrist pronation 4+ 4-  Wrist supination 4+ 4-  Grip strength 38.5# 47.2#   (Blank rows = not tested)   BED MOBILITY:  Findings: Sit to supine Complete Independence Supine to sit CGA  TRANSFERS: Sit to stand: Mod A  Assistive device utilized: Wheelchair (manual)     Stand to sit: CGA  Assistive device utilized: Grab bars      GAIT: Findings: Gait Characteristics: step to pattern, decreased step length- Left,  decreased stance time- Left, decreased stride length, decreased hip/knee flexion- Left, decreased ankle dorsiflexion- Left, trunk flexed,  narrow BOS, and poor foot clearance- Left, Distance walked: 40 ft in // bars, Assistive device utilized:// bars, Level of assistance: Min A, and Comments: Required verbal and tactile cueing keep trunk upright and prevent LLE from adducting past midline  FUNCTIONAL TESTS:  Will assess next session  PATIENT SURVEYS:  Will assess next session  05/09/24 Left Knee extension: -2 deg actively, 0 deg passively Right Knee extension: -9 deg actively, -1 deg passively Left hip flexion AROM: 117 deg Right hip flexion AROM: 113 deg Left shoulder flexion AROM: 130 deg Right shoulder flexion AROM: WNL Left shoulder abduction AROM: 117 deg Right shoulder abduction AROM: WNL                                                                                                                   TREATMENT DATE: 05/16/2024  Subjective: Pt states he has been completing new tricep exercises while watching football games over the weekend.  Pt denies any falls or pain at start of tx session.  Pt. Arrived to PT in w/c with caregiver assist.     Therapeutic Exercise: Nustep L4 B UE/LE (cuing to maintain L LE in midline).   Seated LAQ/ marching/ alt. UE and LE coordination ex. (Challenged/ able to correct with focus and cuing).    Reviewed HEP/ handouts.  Therapeutic Activity:  Forward walking in // bars, 4 passes down and back, CGA and Min A from therapist for maintaining balance, safe turns, and tactile cues to keep left hip from adducting past midline.  After rest break, ambulate forward/ backwards 2 laps in //-bars.   Lateral walking in // bars, 2 passes down and back, CGA from therapist for maintaining balance and verbal cues to maintain upright posture  Ambulate in //-bars and then outside of //-bars with use of RW.  Pt. Able to ambulate 1 lap in //-bar and 9 feet outside of  //-bars with use of RW/ CGA for safety and cuing.  No LOB but a couple UE reaching from RW to //-bars to recheck balance.  Moderate fatigue reported after walking/ standing tasks.   PATIENT EDUCATION: Education details: Pt educated on diagnosis, prognosis, HEP, and POC Person educated: Patient Education method: Explanation, Demonstration, Tactile cues, and Verbal cues Education comprehension: verbalized understanding and returned demonstration  HOME EXERCISE PROGRAM: Access Code: [email or web address] URL: [email or web address] Date: 05/13/2024 Prepared by: Corey Howard Exercises - Seated March - 2 x daily - 7 x weekly - 2 sets - 15 reps - Seated Long Arc Quad - 2 x daily - 7 x weekly - 2 sets - 15 reps - Seated Heel Raise - 2 x daily - 7 x weekly - 2 sets - 15 reps - Seated Elbow Extension with Self-Anchored Resistance - 2 x daily - 7 x weekly - 2 sets - 15 reps - Sit to Stand with Armchair - 1 x daily - 7 x weekly - 3 sets - 10 reps   GOALS: Goals reviewed with patient? Yes  SHORT TERM GOALS: Target date:  06/02/2024  Pt to improve gross LUE strength to at least a 3+/5 in order to achieve greater independence with functional transfers.  Baseline:see above Goal status: INITIAL  2.  Pt to improve left shoulder elevation AROM to at least 105 degrees to become symmetrical with RUE and have greater ease with reaching objects at home.  Baseline: left shoulder AROM: 78 deg Goal status: INITIAL  LONG TERM GOALS: Target date: 06/30/2024  Pt to be able to ambulate household distances (at least 300 ft) with use of RW and no more than CGA from therapist so that he can become independent with daily ADLs.  Baseline: nonambulatory with use of manual WC Goal status: INITIAL  2.  Pt to improve gross LLE strength to at least a 3+/5 in order to assist with forward ambulation in // bars or with RW so pt can become more independent with daily ADLs.  Baseline: see above Goal status: INITIAL  3.  Pt  will require no greater than CGA with sit to stand transfers so that he is able to safely sit to stand at home with less assistance from his caretaker.  Baseline: mod A Goal status: INITIAL  ASSESSMENT:  CLINICAL IMPRESSION: Focus of today's tx session was improving strength and technique for initiation of sit to stand transfers/ gait. Pt with improved ability to ambulate with increased bilateral step length and upright posture in // bars with CGA and Min A from therapist to maintain upright balance.  Pt. Benefits from blue line in //-bars to manage a consistent BOS.  Pt required long duration seated rest breaks to recover from BLE fatigue throughout tx session but remains heavily motivated to participate in tx sessions. Pt. Able to progress to use of RW with PT assist for short distances safely.  Pt with no reports of pain onset at end of tx session but does report general LE fatigue. Will continue to monitor and progress as able.   OBJECTIVE IMPAIRMENTS: Abnormal gait, decreased activity tolerance, decreased balance, decreased coordination, decreased endurance, decreased mobility, difficulty walking, decreased ROM, decreased strength, decreased safety awareness, impaired flexibility, and impaired UE functional use.   ACTIVITY LIMITATIONS: carrying, lifting, bending, standing, squatting, stairs, transfers, bathing, toileting, dressing, self feeding, reach over head, hygiene/grooming, and locomotion level  PARTICIPATION LIMITATIONS: meal prep, cleaning, laundry, driving, shopping, community activity, and yard work  PERSONAL FACTORS: Age, Fitness, Transportation, and 3+ comorbidities: T2DM, A-fib, HTN, Stage III CKD are also affecting patient's functional outcome.   REHAB POTENTIAL: Good  CLINICAL DECISION MAKING: Evolving/moderate complexity  EVALUATION COMPLEXITY: High  PLAN:  PT FREQUENCY: 2x/week  PT DURATION: 8 weeks  PLANNED INTERVENTIONS: 97164- PT Re-evaluation, 97110-Therapeutic  exercises, 97530- Therapeutic activity, 97112- Neuromuscular re-education, 97535- Self Care, [id number]- Manual therapy, Patient/Family education, DME instructions, and Wheelchair mobility training  PLAN FOR NEXT SESSION: Continue transfer training with focus on UE pushoff rand weight shifting anteriorly. Improve general endurance capacity/tolerance to activity.  Corey Howard, PT, DPT # [phone] 05/16/2024, 12:28 PM

## 2024-05-17 ENCOUNTER — Telehealth: Payer: Self-pay | Admitting: Pharmacy Technician

## 2024-05-17 NOTE — Telephone Encounter (Signed)
 Spoke with patient regarding obtaining assistance with Eliquis  through [employer] PAP program.  Patient stated that he had not spent the 3% of household income on medications that [place] requires in order to obtain the medication for free.  Patient stated that he has met his deductible and can obtain the Eliquis  from [facility] for $47 a month.  Patient stated that he could afford the $47 a month and wishes to obtain the Eliquis  through December of 2025.   Assisted patient with applying for L.I.S. (Low Income Subsidy).  It will take around 4 weeks to find out if he is approved.  If patient does not get approved for L.I.S., he will consider switching to Xarelto beginning in January 2026 and I will try to obtain this medication from [facility].  Since [employer] does not require Medicare patients to have spent a certain amount out-of-pocket on medications, I may be able to obtain the Xarelto through their PAP program.  Patient to follow back up with me and make me aware if he is approved for L.I.S.  If not, then he will let me know if he wants to proceed with switching to Xarelto.  Dickey DOROTHA Fritter Patient Financial Assistance Navigator [facility]

## 2024-05-20 ENCOUNTER — Encounter: Payer: Self-pay | Admitting: Physical Therapy

## 2024-05-20 ENCOUNTER — Ambulatory Visit: Admitting: Physical Therapy

## 2024-05-20 DIAGNOSIS — R6889 Other general symptoms and signs: Secondary | ICD-10-CM

## 2024-05-20 DIAGNOSIS — M6281 Muscle weakness (generalized): Secondary | ICD-10-CM

## 2024-05-20 DIAGNOSIS — Z7409 Other reduced mobility: Secondary | ICD-10-CM

## 2024-05-20 DIAGNOSIS — R2689 Other abnormalities of gait and mobility: Secondary | ICD-10-CM

## 2024-05-20 DIAGNOSIS — I63531 Cerebral infarction due to unspecified occlusion or stenosis of right posterior cerebral artery: Secondary | ICD-10-CM

## 2024-05-20 NOTE — Therapy (Signed)
 OUTPATIENT PHYSICAL THERAPY NEURO TREATMENT  Patient Name: Corey Howard MRN: [id number] DOB:[birth year], 73 y.o., male Today's Date: 05/20/2024  REFERRING PROVIDER: Justus Leita DEL, MD  END OF SESSION:  PT End of Session - 05/20/24 0827     Visit Number 5    Number of Visits 16    Date for Recertification  06/30/24    PT Start Time 0827    PT Stop Time 0923    PT Time Calculation (min) 56 min    Equipment Utilized During Treatment Gait belt    Activity Tolerance Patient tolerated treatment well;Patient limited by fatigue;No increased pain    Behavior During Therapy WFL for tasks assessed/performed          Past Medical History:  Diagnosis Date   Diabetes mellitus without complication (HCC)    DVT (deep venous thrombosis) (HCC)    History of DVT (deep vein thrombosis)    Hyperlipidemia    Past Surgical History:  Procedure Laterality Date   TONSILLECTOMY     Patient Active Problem List   Diagnosis Date Noted   Ambulatory dysfunction 01/27/2024   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) 01/27/2024   Paroxysmal atrial fibrillation (HCC) 01/27/2024   Carotid artery stenosis 01/27/2024   Aortic atherosclerosis 01/25/2024   Facial weakness following cerebrovascular accident (CVA) 11/02/2023   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 11/02/2023   Oropharyngeal dysphagia 11/02/2023   Long term current use of anticoagulant therapy 11/02/2023   History of ischemic stroke 10/25/2023   Acquired thrombophilia 06/08/2023   Antiphospholipid antibody syndrome 03/04/2023   Neuropathy 03/04/2023   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   History of Bell's palsy    Tobacco abuse 01/27/2017   Seborrhea 10/01/2015   Type II diabetes mellitus with complication (HCC) 03/16/2015   Essential hypertension 03/16/2015   ONSET DATE: March 13th, 2025 (date of CVA)  REFERRING DIAG: Z98.73 (ICD-10-CM) - History of ischemic  stroke   THERAPY DIAG:  Muscle weakness (generalized)  Decreased independence with transfers  Unable to perform basic transfers independently  Cerebrovascular accident (CVA) due to occlusion of right posterior cerebral artery (HCC)  Other abnormalities of gait and mobility  Rationale for Evaluation and Treatment: Rehabilitation  SUBJECTIVE:                                                                                                                                                                                             SUBJECTIVE STATEMENT: Pt is a 73 year old male presenting to physical therapy with mobility concerns s/p ischemic (multifocal, R occipital) stroke on  March 13th, 2025. Pt reports left sided strength deficits affecting his mobility. Pt currently using a manual WC to get around and relies on his friend/roommate/tenant and a caretaker (who comes in the morning and at night) for assistance with transfers, feeding, dressing, and standing. Pt would like to increase his strength so that he can return to walking, which he states that he was able to do with use of SPC prior to stroke.   PERTINENT HISTORY: Pt had physical therapy for one month prior but came to clinic because of access to // bars with goals of walking. Pt has previous history of DVT, stage III CKD, type II DM, and HTN.   PAIN:  Are you having pain? No  PRECAUTIONS: None  RED FLAGS: None   WEIGHT BEARING RESTRICTIONS: No  FALLS: Has patient fallen in last 6 months? No  LIVING ENVIRONMENT: Lives with: his friend/tenant  Lives in: House/apartment Stairs: Yes: External: 2 steps; can reach both Has following equipment at home: Single point cane, Walker - 2 wheeled, Wheelchair (manual), and bed side commode  PLOF: Independent with basic ADLs, Independent with household mobility with device, and Independent with community mobility without device  PATIENT GOALS: To increase LE strength in order to be able  to walk with SPC  OBJECTIVE:  Note: Objective measures were completed at Evaluation unless otherwise noted.  DIAGNOSTIC FINDINGS: N/A  COGNITION: Overall cognitive status: Within functional limits for tasks assessed   SENSATION: Light touch: WFL (upper and lower dermatomes WNL)  COORDINATION: Finger to nose: impaired  Heel to shin: WFL (LLE limited by strength)  Rapid alternating movements: Alternating heel taps: WFL Alternating hand turns: WFL (limited by LUE strength)  MUSCLE LENGTH: Knee flexion contractures noted bilaterally   POSTURE: rounded shoulders, forward head, increased thoracic kyphosis, and weight shift right  UPPER EXTREMITY ROM:     Active  Right Eval Left Eval  Shoulder abduction 114 deg 78 deg   LOWER EXTREMITY MMT:    MMT Right Eval Left Eval  Hip flexion 4- 3  Hip extension    Hip abduction 4- 3  Hip adduction 4- 4-  Hip internal rotation    Hip external rotation    Knee flexion 3+ 3+  Knee extension 4 3  Ankle dorsiflexion 4- 3+  Ankle plantarflexion    Ankle inversion    Ankle eversion    (Blank rows = not tested)  UPPER EXTREMITY MMT: MMT Right eval Left eval  Shoulder flexion 4 3  Shoulder extension    Shoulder abduction 4- 3  Shoulder adduction    Shoulder extension    Shoulder internal rotation 4 3  Shoulder external rotation 4 3  Middle trapezius    Lower trapezius    Elbow flexion 4 3+  Elbow extension 4- 3  Wrist flexion    Wrist extension    Wrist ulnar deviation    Wrist radial deviation    Wrist pronation 4+ 4-  Wrist supination 4+ 4-  Grip strength 38.5# 47.2#   (Blank rows = not tested)   BED MOBILITY:  Findings: Sit to supine Complete Independence Supine to sit CGA  TRANSFERS: Sit to stand: Mod A  Assistive device utilized: Wheelchair (manual)     Stand to sit: CGA  Assistive device utilized: Grab bars      GAIT: Findings: Gait Characteristics: step to pattern, decreased step length- Left,  decreased stance time- Left, decreased stride length, decreased hip/knee flexion- Left, decreased ankle dorsiflexion- Left, trunk flexed,  narrow BOS, and poor foot clearance- Left, Distance walked: 40 ft in // bars, Assistive device utilized:// bars, Level of assistance: Min A, and Comments: Required verbal and tactile cueing keep trunk upright and prevent LLE from adducting past midline  FUNCTIONAL TESTS:  Will assess next session  PATIENT SURVEYS:  Will assess next session  05/09/24 Left Knee extension: -2 deg actively, 0 deg passively Right Knee extension: -9 deg actively, -1 deg passively Left hip flexion AROM: 117 deg Right hip flexion AROM: 113 deg Left shoulder flexion AROM: 130 deg Right shoulder flexion AROM: WNL Left shoulder abduction AROM: 117 deg Right shoulder abduction AROM: WNL                                                                                                                   TREATMENT DATE: 05/20/2024  Subjective: Pt arrived to PT in w/c with no new complaints.  No c/o pain.  Pt. States his caregivers have noted an improvement in transfers at home with since starting PT.    Therapeutic Activity:  Forward walking in // bars, 3 passes down and back, CGA and Min A from therapist for maintaining balance, safe turns, and tactile cues.  Improve BOS with forwards walking but limited/ narrow with backwards walking. Forward/backwards 2 laps in //-bars with cuing to increase step length while maintaining BOS.  Focus on controlled transfers and reaching back for w/c arm rests to control return to sitting.    Lateral walking in // bars, 1 pass down and back, CGA from therapist for maintaining balance and verbal cues to maintain upright posture.  Marked fatigue/ requested seated break.    Sit to stand in //-bars and then amb. With RW outside of //-bars for 22 feet. No LOB but PT assist to stand closer to RW, esp. With L LE.  Moderate fatigue reported after walking/  standing tasks.  Pt. Unable to stand from w/c into RW today but able to safely stand from w/c in //-bars.    Therapeutic Exercise:  Seated hamstring stretches (use of stool)- L/R and PT instructed pt. On proper seated stretches at home.    Seated LAQ/ marching 10x2 each.   Nustep L4 B UE/LE (cuing to maintain L LE in midline)- 10 min.  0.23 miles.  Moderate cuing to correct L LE midline position.    Reviewed HEP/ handouts.   PATIENT EDUCATION: Education details: Pt educated on diagnosis, prognosis, HEP, and POC Person educated: Patient Education method: Explanation, Demonstration, Tactile cues, and Verbal cues Education comprehension: verbalized understanding and returned demonstration  HOME EXERCISE PROGRAM: Access Code: [email or web address] URL: [email or web address] Date: 05/13/2024 Prepared by: Ozell Sero Exercises - Seated March - 2 x daily - 7 x weekly - 2 sets - 15 reps - Seated Long Arc Quad - 2 x daily - 7 x weekly - 2 sets - 15 reps - Seated Heel Raise - 2 x daily - 7 x weekly - 2 sets - 15 reps - Seated Elbow Extension with Self-Anchored Resistance -  2 x daily - 7 x weekly - 2 sets - 15 reps - Sit to Stand with Armchair - 1 x daily - 7 x weekly - 3 sets - 10 reps   GOALS: Goals reviewed with patient? Yes  SHORT TERM GOALS: Target date: 06/02/2024  Pt to improve gross LUE strength to at least a 3+/5 in order to achieve greater independence with functional transfers.  Baseline:see above Goal status: INITIAL  2.  Pt to improve left shoulder elevation AROM to at least 105 degrees to become symmetrical with RUE and have greater ease with reaching objects at home.  Baseline: left shoulder AROM: 78 deg Goal status: INITIAL  LONG TERM GOALS: Target date: 06/30/2024  Pt to be able to ambulate household distances (at least 300 ft) with use of RW and no more than CGA from therapist so that he can become independent with daily ADLs.  Baseline: nonambulatory with use of  manual WC Goal status: INITIAL  2.  Pt to improve gross LLE strength to at least a 3+/5 in order to assist with forward ambulation in // bars or with RW so pt can become more independent with daily ADLs.  Baseline: see above Goal status: INITIAL  3.  Pt will require no greater than CGA with sit to stand transfers so that he is able to safely sit to stand at home with less assistance from his caretaker.  Baseline: mod A Goal status: INITIAL  ASSESSMENT:  CLINICAL IMPRESSION: Focus of today's tx session was improving strength and technique for initiation of sit to stand transfers/ gait. Pt with improved ability to ambulate with increased bilateral step length and upright posture in // bars with CGA and Min A from therapist to maintain upright balance.  Pt. Benefits from blue line in //-bars to manage a consistent BOS.  Pt required several seated rest breaks after amb. In //-bars due to fatigue.  No c/o pain during tx. Session.  Pt. Able to progress to use of RW with PT assist for short distances safely.  Moderate cuing to correct upright posture within RW.  Pt. will continue to benefit from skilled PT services to increase LE strength, transfers/gait with appropriate assistive device.    OBJECTIVE IMPAIRMENTS: Abnormal gait, decreased activity tolerance, decreased balance, decreased coordination, decreased endurance, decreased mobility, difficulty walking, decreased ROM, decreased strength, decreased safety awareness, impaired flexibility, and impaired UE functional use.   ACTIVITY LIMITATIONS: carrying, lifting, bending, standing, squatting, stairs, transfers, bathing, toileting, dressing, self feeding, reach over head, hygiene/grooming, and locomotion level  PARTICIPATION LIMITATIONS: meal prep, cleaning, laundry, driving, shopping, community activity, and yard work  PERSONAL FACTORS: Age, Fitness, Transportation, and 3+ comorbidities: T2DM, A-fib, HTN, Stage III CKD are also affecting patient's  functional outcome.   REHAB POTENTIAL: Good  CLINICAL DECISION MAKING: Evolving/moderate complexity  EVALUATION COMPLEXITY: High  PLAN:  PT FREQUENCY: 2x/week  PT DURATION: 8 weeks  PLANNED INTERVENTIONS: 97164- PT Re-evaluation, 97110-Therapeutic exercises, 97530- Therapeutic activity, 97112- Neuromuscular re-education, 97535- Self Care, [id number]- Manual therapy, Patient/Family education, DME instructions, and Wheelchair mobility training  PLAN FOR NEXT SESSION: Continue transfer training with focus on UE pushoff rand weight shifting anteriorly. Improve general endurance capacity/tolerance to activity.  Ozell JAYSON Sero, PT, DPT # [phone] 05/20/2024, 9:34 AM

## 2024-05-24 ENCOUNTER — Ambulatory Visit: Admitting: Physical Therapy

## 2024-05-24 ENCOUNTER — Encounter: Payer: Self-pay | Admitting: Physical Therapy

## 2024-05-24 DIAGNOSIS — R6889 Other general symptoms and signs: Secondary | ICD-10-CM

## 2024-05-24 DIAGNOSIS — Z7409 Other reduced mobility: Secondary | ICD-10-CM

## 2024-05-24 DIAGNOSIS — M6281 Muscle weakness (generalized): Secondary | ICD-10-CM | POA: Diagnosis not present

## 2024-05-24 DIAGNOSIS — R2689 Other abnormalities of gait and mobility: Secondary | ICD-10-CM

## 2024-05-24 DIAGNOSIS — I63531 Cerebral infarction due to unspecified occlusion or stenosis of right posterior cerebral artery: Secondary | ICD-10-CM

## 2024-05-24 NOTE — Therapy (Signed)
 OUTPATIENT PHYSICAL THERAPY NEURO TREATMENT  Patient Name: Corey Howard MRN: [id number] DOB:[birth year], 73 y.o., male Today's Date: 05/24/2024  REFERRING PROVIDER: Justus Leita DEL, MD  END OF SESSION:  PT End of Session - 05/24/24 0927     Visit Number 6    Number of Visits 16    Date for Recertification  06/30/24    PT Start Time 0927    PT Stop Time 1020    PT Time Calculation (min) 53 min    Equipment Utilized During Treatment Gait belt    Activity Tolerance Patient tolerated treatment well;Patient limited by fatigue;No increased pain    Behavior During Therapy WFL for tasks assessed/performed           Past Medical History:  Diagnosis Date   Diabetes mellitus without complication (HCC)    DVT (deep venous thrombosis) (HCC)    History of DVT (deep vein thrombosis)    Hyperlipidemia    Past Surgical History:  Procedure Laterality Date   TONSILLECTOMY     Patient Active Problem List   Diagnosis Date Noted   Ambulatory dysfunction 01/27/2024   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) 01/27/2024   Paroxysmal atrial fibrillation (HCC) 01/27/2024   Carotid artery stenosis 01/27/2024   Aortic atherosclerosis 01/25/2024   Facial weakness following cerebrovascular accident (CVA) 11/02/2023   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 11/02/2023   Oropharyngeal dysphagia 11/02/2023   Long term current use of anticoagulant therapy 11/02/2023   History of ischemic stroke 10/25/2023   Acquired thrombophilia 06/08/2023   Antiphospholipid antibody syndrome 03/04/2023   Neuropathy 03/04/2023   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   History of Bell's palsy    Tobacco abuse 01/27/2017   Seborrhea 10/01/2015   Type II diabetes mellitus with complication (HCC) 03/16/2015   Essential hypertension 03/16/2015   ONSET DATE: March 13th, 2025 (date of CVA)  REFERRING DIAG: Z34.73 (ICD-10-CM) - History of ischemic  stroke   THERAPY DIAG:  Muscle weakness (generalized)  Decreased independence with transfers  Unable to perform basic transfers independently  Other abnormalities of gait and mobility  Cerebrovascular accident (CVA) due to occlusion of right posterior cerebral artery (HCC)  Rationale for Evaluation and Treatment: Rehabilitation  SUBJECTIVE:                                                                                                                                                                                             SUBJECTIVE STATEMENT: Pt is a 73 year old male presenting to physical therapy with mobility concerns s/p ischemic (multifocal, R occipital) stroke  on March 13th, 2025. Pt reports left sided strength deficits affecting his mobility. Pt currently using a manual WC to get around and relies on his friend/roommate/tenant and a caretaker (who comes in the morning and at night) for assistance with transfers, feeding, dressing, and standing. Pt would like to increase his strength so that he can return to walking, which he states that he was able to do with use of SPC prior to stroke.   PERTINENT HISTORY: Pt had physical therapy for one month prior but came to clinic because of access to // bars with goals of walking. Pt has previous history of DVT, stage III CKD, type II DM, and HTN.   PAIN:  Are you having pain? No  PRECAUTIONS: None  RED FLAGS: None   WEIGHT BEARING RESTRICTIONS: No  FALLS: Has patient fallen in last 6 months? No  LIVING ENVIRONMENT: Lives with: his friend/tenant  Lives in: House/apartment Stairs: Yes: External: 2 steps; can reach both Has following equipment at home: Single point cane, Walker - 2 wheeled, Wheelchair (manual), and bed side commode  PLOF: Independent with basic ADLs, Independent with household mobility with device, and Independent with community mobility without device  PATIENT GOALS: To increase LE strength in order to be able  to walk with SPC  OBJECTIVE:  Note: Objective measures were completed at Evaluation unless otherwise noted.  DIAGNOSTIC FINDINGS: N/A  COGNITION: Overall cognitive status: Within functional limits for tasks assessed   SENSATION: Light touch: WFL (upper and lower dermatomes WNL)  COORDINATION: Finger to nose: impaired  Heel to shin: WFL (LLE limited by strength)  Rapid alternating movements: Alternating heel taps: WFL Alternating hand turns: WFL (limited by LUE strength)  MUSCLE LENGTH: Knee flexion contractures noted bilaterally   POSTURE: rounded shoulders, forward head, increased thoracic kyphosis, and weight shift right  UPPER EXTREMITY ROM:     Active  Right Eval Left Eval  Shoulder abduction 114 deg 78 deg   LOWER EXTREMITY MMT:    MMT Right Eval Left Eval  Hip flexion 4- 3  Hip extension    Hip abduction 4- 3  Hip adduction 4- 4-  Hip internal rotation    Hip external rotation    Knee flexion 3+ 3+  Knee extension 4 3  Ankle dorsiflexion 4- 3+  Ankle plantarflexion    Ankle inversion    Ankle eversion    (Blank rows = not tested)  UPPER EXTREMITY MMT: MMT Right eval Left eval  Shoulder flexion 4 3  Shoulder extension    Shoulder abduction 4- 3  Shoulder adduction    Shoulder extension    Shoulder internal rotation 4 3  Shoulder external rotation 4 3  Middle trapezius    Lower trapezius    Elbow flexion 4 3+  Elbow extension 4- 3  Wrist flexion    Wrist extension    Wrist ulnar deviation    Wrist radial deviation    Wrist pronation 4+ 4-  Wrist supination 4+ 4-  Grip strength 38.5# 47.2#   (Blank rows = not tested)   BED MOBILITY:  Findings: Sit to supine Complete Independence Supine to sit CGA  TRANSFERS: Sit to stand: Mod A  Assistive device utilized: Wheelchair (manual)     Stand to sit: CGA  Assistive device utilized: Grab bars      GAIT: Findings: Gait Characteristics: step to pattern, decreased step length- Left,  decreased stance time- Left, decreased stride length, decreased hip/knee flexion- Left, decreased ankle dorsiflexion- Left, trunk  flexed, narrow BOS, and poor foot clearance- Left, Distance walked: 40 ft in // bars, Assistive device utilized:// bars, Level of assistance: Min A, and Comments: Required verbal and tactile cueing keep trunk upright and prevent LLE from adducting past midline  FUNCTIONAL TESTS:  Will assess next session  PATIENT SURVEYS:  Will assess next session  05/09/24 Left Knee extension: -2 deg actively, 0 deg passively Right Knee extension: -9 deg actively, -1 deg passively Left hip flexion AROM: 117 deg Right hip flexion AROM: 113 deg Left shoulder flexion AROM: 130 deg Right shoulder flexion AROM: WNL Left shoulder abduction AROM: 117 deg Right shoulder abduction AROM: WNL                                                                                                                   TREATMENT DATE: 05/24/2024  Subjective: Pt arrived to PT in w/c with no new complaints.  No c/o pain.  Pt reports continued adherence to his HEP over the weekend.   Therapeutic Activity:  Forward walking in // bars, 3 passes down and back, CGA and Min A from therapist for maintaining balance, safe turns, and tactile cues. Verbal cuing to increase step length while maintaining BOS.  Focus on controlled transfers and reaching back for w/c arm rests to control return to sitting.   Lateral walking in // bars, 2 passes down and back, CGA from therapist for maintaining balance and verbal cues to maintain upright posture.  Marked fatigue/ requested seated break after each pass.  Sit to stand in //-bars and then amb. With RW inside of //-bars, 1x down and back. No LOB.  Significant fatigue noted/reported after walking with RW.    Therapeutic Exercise:  Seated LAQ/ marching 2x10 each   Tricep Extensions and shoulder extensions with RTB, 2x10  Nustep L4 B UE/LE (cuing to maintain L LE in  midline)- 10 min.  0.24 miles.  Moderate cuing to correct L LE midline position.    Pre-Ambulation: spO2: 98% HR: 71 bpm Post-Ambulation: spO2:99% HR: 89 bpm  Not performed today:  Seated hamstring stretches (use of stool)- L/R and PT instructed pt. On proper seated stretches at home.    PATIENT EDUCATION: Education details: Pt educated on diagnosis, prognosis, HEP, and POC Person educated: Patient Education method: Explanation, Demonstration, Tactile cues, and Verbal cues Education comprehension: verbalized understanding and returned demonstration  HOME EXERCISE PROGRAM: Access Code: [email or web address] URL: [email or web address] Date: 05/13/2024 Prepared by: Ozell Sero Exercises - Seated March - 2 x daily - 7 x weekly - 2 sets - 15 reps - Seated Long Arc Quad - 2 x daily - 7 x weekly - 2 sets - 15 reps - Seated Heel Raise - 2 x daily - 7 x weekly - 2 sets - 15 reps - Seated Elbow Extension with Self-Anchored Resistance - 2 x daily - 7 x weekly - 2 sets - 15 reps - Sit to Stand with Armchair - 1 x daily - 7 x weekly - 3 sets - 10  reps   GOALS: Goals reviewed with patient? Yes  SHORT TERM GOALS: Target date: 06/02/2024  Pt to improve gross LUE strength to at least a 3+/5 in order to achieve greater independence with functional transfers.  Baseline:see above Goal status: INITIAL  2.  Pt to improve left shoulder elevation AROM to at least 105 degrees to become symmetrical with RUE and have greater ease with reaching objects at home.  Baseline: left shoulder AROM: 78 deg Goal status: INITIAL  LONG TERM GOALS: Target date: 06/30/2024  Pt to be able to ambulate household distances (at least 300 ft) with use of RW and no more than CGA from therapist so that he can become independent with daily ADLs.  Baseline: nonambulatory with use of manual WC Goal status: INITIAL  2.  Pt to improve gross LLE strength to at least a 3+/5 in order to assist with forward ambulation in // bars or  with RW so pt can become more independent with daily ADLs.  Baseline: see above Goal status: INITIAL  3.  Pt will require no greater than CGA with sit to stand transfers so that he is able to safely sit to stand at home with less assistance from his caretaker.  Baseline: mod A Goal status: INITIAL  ASSESSMENT:  CLINICAL IMPRESSION: Focus of today's tx session was improving strength and technique for initiation of sit to stand transfers/ gait. Pt with improved ability to take increased length of steps bilaterally and maintain upright stance with less verbal cueing required. Pt with improved ability to push off one armrest of WC with left UE during sit to stand transfer but still requires use of RUE to pull up from // bars and CGA assist from therapist. Pt also with improved ability to use RUE to reach back for arm chair to assist with controlled descent to complete stand to sit transfers. Pt introduced to shoulder extensions and tricep extensions with RTB which he was able to complete without significant difficulty or any onset of pain. Pt continues to require frequent seated rest breaks between each activity/set to recover from general fatigue. Pt. will continue to benefit from skilled PT services to increase LE strength, transfers/gait with appropriate assistive device.    OBJECTIVE IMPAIRMENTS: Abnormal gait, decreased activity tolerance, decreased balance, decreased coordination, decreased endurance, decreased mobility, difficulty walking, decreased ROM, decreased strength, decreased safety awareness, impaired flexibility, and impaired UE functional use.   ACTIVITY LIMITATIONS: carrying, lifting, bending, standing, squatting, stairs, transfers, bathing, toileting, dressing, self feeding, reach over head, hygiene/grooming, and locomotion level  PARTICIPATION LIMITATIONS: meal prep, cleaning, laundry, driving, shopping, community activity, and yard work  PERSONAL FACTORS: Age, Fitness,  Transportation, and 3+ comorbidities: T2DM, A-fib, HTN, Stage III CKD are also affecting patient's functional outcome.   REHAB POTENTIAL: Good  CLINICAL DECISION MAKING: Evolving/moderate complexity  EVALUATION COMPLEXITY: High  PLAN:  PT FREQUENCY: 2x/week  PT DURATION: 8 weeks  PLANNED INTERVENTIONS: 97164- PT Re-evaluation, 97110-Therapeutic exercises, 97530- Therapeutic activity, 97112- Neuromuscular re-education, 97535- Self Care, [id number]- Manual therapy, Patient/Family education, DME instructions, and Wheelchair mobility training  PLAN FOR NEXT SESSION: Continue transfer training with focus on controlled descent and UE pushoff and weight shifting anteriorly. Improve general endurance capacity/tolerance to activity.  Curtistine Bracket, SPT  Ozell JAYSON Sero, PT, DPT # [phone] 05/24/2024, 10:16 AM

## 2024-05-25 DIAGNOSIS — I69998 Other sequelae following unspecified cerebrovascular disease: Secondary | ICD-10-CM | POA: Diagnosis not present

## 2024-05-25 DIAGNOSIS — R262 Difficulty in walking, not elsewhere classified: Secondary | ICD-10-CM | POA: Diagnosis not present

## 2024-05-25 DIAGNOSIS — R531 Weakness: Secondary | ICD-10-CM | POA: Diagnosis not present

## 2024-05-27 ENCOUNTER — Encounter: Payer: Self-pay | Admitting: Physical Therapy

## 2024-05-27 ENCOUNTER — Ambulatory Visit: Admitting: Physical Therapy

## 2024-05-27 DIAGNOSIS — Z7409 Other reduced mobility: Secondary | ICD-10-CM

## 2024-05-27 DIAGNOSIS — R6889 Other general symptoms and signs: Secondary | ICD-10-CM

## 2024-05-27 DIAGNOSIS — I63531 Cerebral infarction due to unspecified occlusion or stenosis of right posterior cerebral artery: Secondary | ICD-10-CM

## 2024-05-27 DIAGNOSIS — R2689 Other abnormalities of gait and mobility: Secondary | ICD-10-CM

## 2024-05-27 DIAGNOSIS — M6281 Muscle weakness (generalized): Secondary | ICD-10-CM

## 2024-05-27 NOTE — Therapy (Signed)
 OUTPATIENT PHYSICAL THERAPY NEURO TREATMENT  Patient Name: Corey Howard MRN: [id number] DOB:11-15-1950, 73 y.o., male Today's Date: 05/27/2024  REFERRING PROVIDER: Justus Leita DEL, MD  END OF SESSION:  PT End of Session - 05/27/24 1048     Visit Number 7    Number of Visits 16    Date for Recertification  06/30/24    PT Start Time 1048    PT Stop Time 1138    PT Time Calculation (min) 50 min    Equipment Utilized During Treatment Gait belt    Activity Tolerance Patient tolerated treatment well;Patient limited by fatigue;No increased pain    Behavior During Therapy WFL for tasks assessed/performed           Past Medical History:  Diagnosis Date   Diabetes mellitus without complication (HCC)    DVT (deep venous thrombosis) (HCC)    History of DVT (deep vein thrombosis)    Hyperlipidemia    Past Surgical History:  Procedure Laterality Date   TONSILLECTOMY     Patient Active Problem List   Diagnosis Date Noted   Ambulatory dysfunction 01/27/2024   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) 01/27/2024   Paroxysmal atrial fibrillation (HCC) 01/27/2024   Carotid artery stenosis 01/27/2024   Aortic atherosclerosis 01/25/2024   Facial weakness following cerebrovascular accident (CVA) 11/02/2023   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 11/02/2023   Oropharyngeal dysphagia 11/02/2023   Long term current use of anticoagulant therapy 11/02/2023   History of ischemic stroke 10/25/2023   Acquired thrombophilia 06/08/2023   Antiphospholipid antibody syndrome 03/04/2023   Neuropathy 03/04/2023   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   History of Bell's palsy    Tobacco abuse 01/27/2017   Seborrhea 10/01/2015   Type II diabetes mellitus with complication (HCC) 03/16/2015   Essential hypertension 03/16/2015   ONSET DATE: March 13th, 2025 (date of CVA)  REFERRING DIAG: Z47.73 (ICD-10-CM) - History of ischemic  stroke   THERAPY DIAG:  Muscle weakness (generalized)  Decreased independence with transfers  Unable to perform basic transfers independently  Other abnormalities of gait and mobility  Cerebrovascular accident (CVA) due to occlusion of right posterior cerebral artery (HCC)  Rationale for Evaluation and Treatment: Rehabilitation  SUBJECTIVE:                                                                                                                                                                                             SUBJECTIVE STATEMENT: Pt is a 73 year old male presenting to physical therapy with mobility concerns s/p ischemic (multifocal, R occipital) stroke  on March 13th, 2025. Pt reports left sided strength deficits affecting his mobility. Pt currently using a manual WC to get around and relies on his friend/roommate/tenant and a caretaker (who comes in the morning and at night) for assistance with transfers, feeding, dressing, and standing. Pt would like to increase his strength so that he can return to walking, which he states that he was able to do with use of SPC prior to stroke.   PERTINENT HISTORY: Pt had physical therapy for one month prior but came to clinic because of access to // bars with goals of walking. Pt has previous history of DVT, stage III CKD, type II DM, and HTN.   PAIN:  Are you having pain? No  PRECAUTIONS: None  RED FLAGS: None   WEIGHT BEARING RESTRICTIONS: No  FALLS: Has patient fallen in last 6 months? No  LIVING ENVIRONMENT: Lives with: his friend/tenant  Lives in: House/apartment Stairs: Yes: External: 2 steps; can reach both Has following equipment at home: Single point cane, Walker - 2 wheeled, Wheelchair (manual), and bed side commode  PLOF: Independent with basic ADLs, Independent with household mobility with device, and Independent with community mobility without device  PATIENT GOALS: To increase LE strength in order to be able  to walk with SPC  OBJECTIVE:  Note: Objective measures were completed at Evaluation unless otherwise noted.  DIAGNOSTIC FINDINGS: N/A  COGNITION: Overall cognitive status: Within functional limits for tasks assessed   SENSATION: Light touch: WFL (upper and lower dermatomes WNL)  COORDINATION: Finger to nose: impaired  Heel to shin: WFL (LLE limited by strength)  Rapid alternating movements: Alternating heel taps: WFL Alternating hand turns: WFL (limited by LUE strength)  MUSCLE LENGTH: Knee flexion contractures noted bilaterally   POSTURE: rounded shoulders, forward head, increased thoracic kyphosis, and weight shift right  UPPER EXTREMITY ROM:     Active  Right Eval Left Eval  Shoulder abduction 114 deg 78 deg   LOWER EXTREMITY MMT:    MMT Right Eval Left Eval  Hip flexion 4- 3  Hip extension    Hip abduction 4- 3  Hip adduction 4- 4-  Hip internal rotation    Hip external rotation    Knee flexion 3+ 3+  Knee extension 4 3  Ankle dorsiflexion 4- 3+  Ankle plantarflexion    Ankle inversion    Ankle eversion    (Blank rows = not tested)  UPPER EXTREMITY MMT: MMT Right eval Left eval  Shoulder flexion 4 3  Shoulder extension    Shoulder abduction 4- 3  Shoulder adduction    Shoulder extension    Shoulder internal rotation 4 3  Shoulder external rotation 4 3  Middle trapezius    Lower trapezius    Elbow flexion 4 3+  Elbow extension 4- 3  Wrist flexion    Wrist extension    Wrist ulnar deviation    Wrist radial deviation    Wrist pronation 4+ 4-  Wrist supination 4+ 4-  Grip strength 38.5# 47.2#   (Blank rows = not tested)   BED MOBILITY:  Findings: Sit to supine Complete Independence Supine to sit CGA  TRANSFERS: Sit to stand: Mod A  Assistive device utilized: Wheelchair (manual)     Stand to sit: CGA  Assistive device utilized: Grab bars      GAIT: Findings: Gait Characteristics: step to pattern, decreased step length- Left,  decreased stance time- Left, decreased stride length, decreased hip/knee flexion- Left, decreased ankle dorsiflexion- Left, trunk  flexed, narrow BOS, and poor foot clearance- Left, Distance walked: 40 ft in // bars, Assistive device utilized:// bars, Level of assistance: Min A, and Comments: Required verbal and tactile cueing keep trunk upright and prevent LLE from adducting past midline  FUNCTIONAL TESTS:  Will assess next session  PATIENT SURVEYS:  Will assess next session  05/09/24 Left Knee extension: -2 deg actively, 0 deg passively Right Knee extension: -9 deg actively, -1 deg passively Left hip flexion AROM: 117 deg Right hip flexion AROM: 113 deg Left shoulder flexion AROM: 130 deg Right shoulder flexion AROM: WNL Left shoulder abduction AROM: 117 deg Right shoulder abduction AROM: WNL                                                                                                                   TREATMENT DATE: 05/27/2024  Subjective: Pt arrived to PT in w/c with no new complaints.  No c/o pain.  Pt reports continued adherence to his HEP this week.  Pt. States he is working on standing from w/c and keeping nose over toes.    Therapeutic Activity:  Forward walking in // bars, 3 passes down and back, CGA from therapist for maintaining balance, safe turns, and tactile cues.  Pt. Amb. With more consistent step length while maintaining BOS while in //-bars.  Focus on controlled transfers and reaching back for w/c arm rests to control return to sitting.    Lateral walking in // bars, 3 passes down and back, CGA from therapist for maintaining balance and verbal cues to maintain upright posture.  Marked fatigue/ requested seated break after each pass.  Pt. Able to complete 1 more rep. Today.    Sit to stand in //-bars and then amb. With RW outside of //-bars in gym/ hallway for 57 feet.  Pt. Requested to sit secondary to fatigue in B LE.  No LOB and pt. Able to complete 2 turns with RW  and CGA for safety.    W/c to car (passenger side) transfer with mod A.  Pt. Prefers to pull up on car door while window open.  Limited standing tolerance/ knee extension requiring increase assist from PT.  Pt. Able to get B LE in car once sitting on passenger chair.    Therapeutic Exercise:  no charge  Nustep L4 B UE/LE (cuing to maintain L LE in midline)- 10 min.  0.24 miles.  Moderate cuing to correct L LE midline position.    Reviewed hamstring stretches for home.    PATIENT EDUCATION: Education details: Pt educated on diagnosis, prognosis, HEP, and POC Person educated: Patient Education method: Explanation, Demonstration, Tactile cues, and Verbal cues Education comprehension: verbalized understanding and returned demonstration  HOME EXERCISE PROGRAM: Access Code: [email or web address] URL: [email or web address] Date: 05/13/2024 Prepared by: Ozell Sero Exercises - Seated March - 2 x daily - 7 x weekly - 2 sets - 15 reps - Seated Long Arc Quad - 2 x daily - 7 x weekly - 2 sets - 15 reps - Seated  Heel Raise - 2 x daily - 7 x weekly - 2 sets - 15 reps - Seated Elbow Extension with Self-Anchored Resistance - 2 x daily - 7 x weekly - 2 sets - 15 reps - Sit to Stand with Armchair - 1 x daily - 7 x weekly - 3 sets - 10 reps   GOALS: Goals reviewed with patient? Yes  SHORT TERM GOALS: Target date: 06/02/2024  Pt to improve gross LUE strength to at least a 3+/5 in order to achieve greater independence with functional transfers.  Baseline:see above Goal status: INITIAL  2.  Pt to improve left shoulder elevation AROM to at least 105 degrees to become symmetrical with RUE and have greater ease with reaching objects at home.  Baseline: left shoulder AROM: 78 deg Goal status: INITIAL  LONG TERM GOALS: Target date: 06/30/2024  Pt to be able to ambulate household distances (at least 300 ft) with use of RW and no more than CGA from therapist so that he can become independent with daily  ADLs.  Baseline: nonambulatory with use of manual WC Goal status: INITIAL  2.  Pt to improve gross LLE strength to at least a 3+/5 in order to assist with forward ambulation in // bars or with RW so pt can become more independent with daily ADLs.  Baseline: see above Goal status: INITIAL  3.  Pt will require no greater than CGA with sit to stand transfers so that he is able to safely sit to stand at home with less assistance from his caretaker.  Baseline: mod A Goal status: INITIAL  ASSESSMENT:  CLINICAL IMPRESSION: Focus of today's tx session was improving strength and technique for initiation of sit to stand transfers/ gait. Pt with improved ability to take increased length of steps bilaterally and maintain upright stance with less verbal cueing required. Pt with improved ability to push off one armrest of WC with left UE during sit to stand transfer but still requires use of RUE to pull up from // bars and CGA assist from therapist. Pt also with improved ability to use RUE to reach back for arm chair to assist with controlled descent to complete stand to sit transfers. Pt. Challenged with car transfer today secondary to LE fatigue/ limited  Pt continues to require frequent seated rest breaks between each activity/set to recover from general fatigue. Pt. will continue to benefit from skilled PT services to increase LE strength, transfers/gait with appropriate assistive device.    OBJECTIVE IMPAIRMENTS: Abnormal gait, decreased activity tolerance, decreased balance, decreased coordination, decreased endurance, decreased mobility, difficulty walking, decreased ROM, decreased strength, decreased safety awareness, impaired flexibility, and impaired UE functional use.   ACTIVITY LIMITATIONS: carrying, lifting, bending, standing, squatting, stairs, transfers, bathing, toileting, dressing, self feeding, reach over head, hygiene/grooming, and locomotion level  PARTICIPATION LIMITATIONS: meal prep,  cleaning, laundry, driving, shopping, community activity, and yard work  PERSONAL FACTORS: Age, Fitness, Transportation, and 3+ comorbidities: T2DM, A-fib, HTN, Stage III CKD are also affecting patient's functional outcome.   REHAB POTENTIAL: Good  CLINICAL DECISION MAKING: Evolving/moderate complexity  EVALUATION COMPLEXITY: High  PLAN:  PT FREQUENCY: 2x/week  PT DURATION: 8 weeks  PLANNED INTERVENTIONS: 97164- PT Re-evaluation, 97110-Therapeutic exercises, 97530- Therapeutic activity, 97112- Neuromuscular re-education, 97535- Self Care, [id number]- Manual therapy, Patient/Family education, DME instructions, and Wheelchair mobility training  PLAN FOR NEXT SESSION: Continue transfer training with focus on controlled descent and UE pushoff and weight shifting anteriorly. Improve general endurance capacity/tolerance to activity.  Ozell JAYSON Sero,  PT, DPT # [id number] 05/27/2024, 12:07 PM

## 2024-06-01 ENCOUNTER — Ambulatory Visit: Admitting: Physical Therapy

## 2024-06-01 ENCOUNTER — Encounter: Payer: Self-pay | Admitting: Physical Therapy

## 2024-06-01 DIAGNOSIS — M6281 Muscle weakness (generalized): Secondary | ICD-10-CM

## 2024-06-01 DIAGNOSIS — I63531 Cerebral infarction due to unspecified occlusion or stenosis of right posterior cerebral artery: Secondary | ICD-10-CM

## 2024-06-01 DIAGNOSIS — R2689 Other abnormalities of gait and mobility: Secondary | ICD-10-CM

## 2024-06-01 DIAGNOSIS — R6889 Other general symptoms and signs: Secondary | ICD-10-CM

## 2024-06-01 DIAGNOSIS — Z7409 Other reduced mobility: Secondary | ICD-10-CM

## 2024-06-01 NOTE — Therapy (Signed)
 OUTPATIENT PHYSICAL THERAPY NEURO TREATMENT  Patient Name: Corey Howard MRN: [id number] DOB:Dec 24, 1950, 73 y.o., male Today's Date: 06/01/2024  REFERRING PROVIDER: Justus Leita DEL, MD  END OF SESSION:  PT End of Session - 06/01/24 0938     Visit Number 8    Number of Visits 16    Date for Recertification  06/30/24    PT Start Time 0940    PT Stop Time 1025    PT Time Calculation (min) 45 min    Equipment Utilized During Treatment Gait belt    Activity Tolerance Patient tolerated treatment well;Patient limited by fatigue;No increased pain    Behavior During Therapy WFL for tasks assessed/performed          Past Medical History:  Diagnosis Date   Diabetes mellitus without complication (HCC)    DVT (deep venous thrombosis) (HCC)    History of DVT (deep vein thrombosis)    Hyperlipidemia    Past Surgical History:  Procedure Laterality Date   TONSILLECTOMY     Patient Active Problem List   Diagnosis Date Noted   Ambulatory dysfunction 01/27/2024   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) 01/27/2024   Paroxysmal atrial fibrillation (HCC) 01/27/2024   Carotid artery stenosis 01/27/2024   Aortic atherosclerosis 01/25/2024   Facial weakness following cerebrovascular accident (CVA) 11/02/2023   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 11/02/2023   Oropharyngeal dysphagia 11/02/2023   Long term current use of anticoagulant therapy 11/02/2023   History of ischemic stroke 10/25/2023   Acquired thrombophilia 06/08/2023   Antiphospholipid antibody syndrome 03/04/2023   Neuropathy 03/04/2023   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   History of Bell's palsy    Tobacco abuse 01/27/2017   Seborrhea 10/01/2015   Type II diabetes mellitus with complication (HCC) 03/16/2015   Essential hypertension 03/16/2015   ONSET DATE: March 13th, 2025 (date of CVA)  REFERRING DIAG: Z55.73 (ICD-10-CM) - History of ischemic  stroke   THERAPY DIAG:  Muscle weakness (generalized)  Unable to perform basic transfers independently  Decreased independence with transfers  Other abnormalities of gait and mobility  Cerebrovascular accident (CVA) due to occlusion of right posterior cerebral artery (HCC)  Rationale for Evaluation and Treatment: Rehabilitation  SUBJECTIVE:                                                                                                                                                                                             SUBJECTIVE STATEMENT: Pt is a 73 year old male presenting to physical therapy with mobility concerns s/p ischemic (multifocal, R occipital) stroke on  March 13th, 2025. Pt reports left sided strength deficits affecting his mobility. Pt currently using a manual WC to get around and relies on his friend/roommate/tenant and a caretaker (who comes in the morning and at night) for assistance with transfers, feeding, dressing, and standing. Pt would like to increase his strength so that he can return to walking, which he states that he was able to do with use of SPC prior to stroke.   PERTINENT HISTORY: Pt had physical therapy for one month prior but came to clinic because of access to // bars with goals of walking. Pt has previous history of DVT, stage III CKD, type II DM, and HTN.   PAIN:  Are you having pain? No  PRECAUTIONS: None  RED FLAGS: None   WEIGHT BEARING RESTRICTIONS: No  FALLS: Has patient fallen in last 6 months? No  LIVING ENVIRONMENT: Lives with: his friend/tenant  Lives in: House/apartment Stairs: Yes: External: 2 steps; can reach both Has following equipment at home: Single point cane, Walker - 2 wheeled, Wheelchair (manual), and bed side commode  PLOF: Independent with basic ADLs, Independent with household mobility with device, and Independent with community mobility without device  PATIENT GOALS: To increase LE strength in order to be able  to walk with SPC  OBJECTIVE:  Note: Objective measures were completed at Evaluation unless otherwise noted.  DIAGNOSTIC FINDINGS: N/A  COGNITION: Overall cognitive status: Within functional limits for tasks assessed   SENSATION: Light touch: WFL (upper and lower dermatomes WNL)  COORDINATION: Finger to nose: impaired  Heel to shin: WFL (LLE limited by strength)  Rapid alternating movements: Alternating heel taps: WFL Alternating hand turns: WFL (limited by LUE strength)  MUSCLE LENGTH: Knee flexion contractures noted bilaterally   POSTURE: rounded shoulders, forward head, increased thoracic kyphosis, and weight shift right  UPPER EXTREMITY ROM:     Active  Right Eval Left Eval  Shoulder abduction 114 deg 78 deg   LOWER EXTREMITY MMT:    MMT Right Eval Left Eval  Hip flexion 4- 3  Hip extension    Hip abduction 4- 3  Hip adduction 4- 4-  Hip internal rotation    Hip external rotation    Knee flexion 3+ 3+  Knee extension 4 3  Ankle dorsiflexion 4- 3+  Ankle plantarflexion    Ankle inversion    Ankle eversion    (Blank rows = not tested)  UPPER EXTREMITY MMT: MMT Right eval Left eval  Shoulder flexion 4 3  Shoulder extension    Shoulder abduction 4- 3  Shoulder adduction    Shoulder extension    Shoulder internal rotation 4 3  Shoulder external rotation 4 3  Middle trapezius    Lower trapezius    Elbow flexion 4 3+  Elbow extension 4- 3  Wrist flexion    Wrist extension    Wrist ulnar deviation    Wrist radial deviation    Wrist pronation 4+ 4-  Wrist supination 4+ 4-  Grip strength 38.5# 47.2#   (Blank rows = not tested)   BED MOBILITY:  Findings: Sit to supine Complete Independence Supine to sit CGA  TRANSFERS: Sit to stand: Mod A  Assistive device utilized: Wheelchair (manual)     Stand to sit: CGA  Assistive device utilized: Grab bars      GAIT: Findings: Gait Characteristics: step to pattern, decreased step length- Left,  decreased stance time- Left, decreased stride length, decreased hip/knee flexion- Left, decreased ankle dorsiflexion- Left, trunk flexed,  narrow BOS, and poor foot clearance- Left, Distance walked: 40 ft in // bars, Assistive device utilized:// bars, Level of assistance: Min A, and Comments: Required verbal and tactile cueing keep trunk upright and prevent LLE from adducting past midline  FUNCTIONAL TESTS:  Will assess next session  PATIENT SURVEYS:  Will assess next session  05/09/24 Left Knee extension: -2 deg actively, 0 deg passively Right Knee extension: -9 deg actively, -1 deg passively Left hip flexion AROM: 117 deg Right hip flexion AROM: 113 deg Left shoulder flexion AROM: 130 deg Right shoulder flexion AROM: WNL Left shoulder abduction AROM: 117 deg Right shoulder abduction AROM: WNL                                                                                                                   TREATMENT DATE: 06/01/2024  Subjective: Pt arrived to PT in w/c with no new complaints.  No c/o pain.  Pt reports continued adherence to his HEP this week.    Therapeutic Activity:  Forward walking in // bars, 3 passes down and back, CGA from therapist for maintaining balance, safe turns, and tactile cues.  Pt. Amb with increased step length while maintaining BOS while in //-bars.  Focus on controlled transfers and reaching back for w/c arm rests to control return to sitting.    Lateral walking in // bars, 2 passes down and back, CGA from therapist for maintaining balance and verbal cues to maintain upright posture.  Marked fatigue/ requested seated break after each pass.    Forward ambulation with RW outside of //-bars in gym/ hallway for 25 feet.  Pt. Requested to sit secondary to fatigue in LLE. No LOB and pt. Able to complete turns with RW and CGA for safety.  Stand to sit transfer required max A from therapist to prevent LOB posteriorly and controlled descent to chair   Sit to  stands from raised up blue mat table with focus on pushing off triceps to assist with transfer rather than pulling off an object, 5 total attempts. Final attempt patient was able to stand upright momentarily.   W/c to car (passenger side) transfer with mod A.  Pt. Prefers to pull up on car door ith window open.  Limited standing tolerance/ knee extension requiring increase assist from PT.  Pt. Able to get B LE in car once sitting on passenger chair.    Reviewed hamstring stretches for home.   Pre ambulation vitals: oxygen saturation 100%, 80 bpm Post ambulation vitals: oxygen saturation 98%, 100 bpm   Not performed today:  Therapeutic Exercise:  Nustep L4 B UE/LE (cuing to maintain L LE in midline)- 10 min.  0.24 miles.  Moderate cuing to correct L LE midline position.    PATIENT EDUCATION: Education details: Pt educated on diagnosis, prognosis, HEP, and POC Person educated: Patient Education method: Explanation, Demonstration, Tactile cues, and Verbal cues Education comprehension: verbalized understanding and returned demonstration  HOME EXERCISE PROGRAM: Access Code: [email or web address] URL: [email or web address] Date: 05/13/2024 Prepared by: Ozell  Sherk Exercises - Seated March - 2 x daily - 7 x weekly - 2 sets - 15 reps - Seated Long Arc Quad - 2 x daily - 7 x weekly - 2 sets - 15 reps - Seated Heel Raise - 2 x daily - 7 x weekly - 2 sets - 15 reps - Seated Elbow Extension with Self-Anchored Resistance - 2 x daily - 7 x weekly - 2 sets - 15 reps - Sit to Stand with Armchair - 1 x daily - 7 x weekly - 3 sets - 10 reps   GOALS: Goals reviewed with patient? Yes  SHORT TERM GOALS: Target date: 06/02/2024  Pt to improve gross LUE strength to at least a 3+/5 in order to achieve greater independence with functional transfers.  Baseline:see above Goal status: INITIAL  2.  Pt to improve left shoulder elevation AROM to at least 105 degrees to become symmetrical with RUE and have greater  ease with reaching objects at home.  Baseline: left shoulder AROM: 78 deg Goal status: INITIAL  LONG TERM GOALS: Target date: 06/30/2024  Pt to be able to ambulate household distances (at least 300 ft) with use of RW and no more than CGA from therapist so that he can become independent with daily ADLs.  Baseline: nonambulatory with use of manual WC Goal status: INITIAL  2.  Pt to improve gross LLE strength to at least a 3+/5 in order to assist with forward ambulation in // bars or with RW so pt can become more independent with daily ADLs.  Baseline: see above Goal status: INITIAL  3.  Pt will require no greater than CGA with sit to stand transfers so that he is able to safely sit to stand at home with less assistance from his caretaker.  Baseline: mod A Goal status: INITIAL  ASSESSMENT:  CLINICAL IMPRESSION: Focus of today's tx session was to work on improving efficiency and technique of initiation of sit to stand transfers/ gait. Sit to stands from raised up blue mat table with focus on pushing off triceps was continued on this day and patient required frequent demonstration and verbal/tactile cueing to remind patient to engage triceps to propel body weight anteriorly and superiorly, as he has tendency to propel himself posteriorly or superiorly, but patient was eventually able to stand upright momentarily with proper technique. Pt continues to require long duration seated rest breaks to recover from general, systemic fatigue. Pt ambulated with RW outside // bars with CGA from therapist for 25 feet before experiencing extreme fatigue of LLE and max A from therapist to prevent LOB posteriorly for controlled descent. Pt. will continue to benefit from skilled PT services to increase LE strength, transfers/gait with appropriate assistive device.    OBJECTIVE IMPAIRMENTS: Abnormal gait, decreased activity tolerance, decreased balance, decreased coordination, decreased endurance, decreased  mobility, difficulty walking, decreased ROM, decreased strength, decreased safety awareness, impaired flexibility, and impaired UE functional use.   ACTIVITY LIMITATIONS: carrying, lifting, bending, standing, squatting, stairs, transfers, bathing, toileting, dressing, self feeding, reach over head, hygiene/grooming, and locomotion level  PARTICIPATION LIMITATIONS: meal prep, cleaning, laundry, driving, shopping, community activity, and yard work  PERSONAL FACTORS: Age, Fitness, Transportation, and 3+ comorbidities: T2DM, A-fib, HTN, Stage III CKD are also affecting patient's functional outcome.   REHAB POTENTIAL: Good  CLINICAL DECISION MAKING: Evolving/moderate complexity  EVALUATION COMPLEXITY: High  PLAN:  PT FREQUENCY: 2x/week  PT DURATION: 8 weeks  PLANNED INTERVENTIONS: 97164- PT Re-evaluation, 97110-Therapeutic exercises, 97530- Therapeutic activity, [id number]- Neuromuscular re-education,  02464- Self Care, [id number]- Manual therapy, Patient/Family education, DME instructions, and Wheelchair mobility training  PLAN FOR NEXT SESSION: Continue transfer training with focus on controlled descent and UE pushoff and weight shifting anteriorly. Improve general endurance capacity/tolerance to activity.  UPDATE STGs  Curtistine Bracket, SPT  Ozell JAYSON Sero, PT, DPT # [id number] 06/01/2024, 10:25 AM

## 2024-06-03 ENCOUNTER — Ambulatory Visit: Admitting: Physical Therapy

## 2024-06-03 DIAGNOSIS — R6889 Other general symptoms and signs: Secondary | ICD-10-CM

## 2024-06-03 DIAGNOSIS — Z7409 Other reduced mobility: Secondary | ICD-10-CM

## 2024-06-03 DIAGNOSIS — I63531 Cerebral infarction due to unspecified occlusion or stenosis of right posterior cerebral artery: Secondary | ICD-10-CM

## 2024-06-03 DIAGNOSIS — M6281 Muscle weakness (generalized): Secondary | ICD-10-CM

## 2024-06-03 DIAGNOSIS — R2689 Other abnormalities of gait and mobility: Secondary | ICD-10-CM

## 2024-06-03 NOTE — Therapy (Signed)
 OUTPATIENT PHYSICAL THERAPY NEURO TREATMENT  Patient Name: Corey Howard MRN: [id number] DOB:1951/04/10, 73 y.o., male Today's Date: 06/03/2024  REFERRING PROVIDER: Justus Leita DEL, MD  END OF SESSION:  PT End of Session - 06/03/24 1206     Visit Number 9    Number of Visits 16    Date for Recertification  06/30/24    PT Start Time 1030    PT Stop Time 1115    PT Time Calculation (min) 45 min    Equipment Utilized During Treatment Gait belt    Activity Tolerance Patient tolerated treatment well;Patient limited by fatigue;No increased pain    Behavior During Therapy WFL for tasks assessed/performed         Past Medical History:  Diagnosis Date   Diabetes mellitus without complication (HCC)    DVT (deep venous thrombosis) (HCC)    History of DVT (deep vein thrombosis)    Hyperlipidemia    Past Surgical History:  Procedure Laterality Date   TONSILLECTOMY     Patient Active Problem List   Diagnosis Date Noted   Ambulatory dysfunction 01/27/2024   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) 01/27/2024   Paroxysmal atrial fibrillation (HCC) 01/27/2024   Carotid artery stenosis 01/27/2024   Aortic atherosclerosis 01/25/2024   Facial weakness following cerebrovascular accident (CVA) 11/02/2023   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 11/02/2023   Oropharyngeal dysphagia 11/02/2023   Long term current use of anticoagulant therapy 11/02/2023   History of ischemic stroke 10/25/2023   Acquired thrombophilia 06/08/2023   Antiphospholipid antibody syndrome 03/04/2023   Neuropathy 03/04/2023   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   History of Bell's palsy    Tobacco abuse 01/27/2017   Seborrhea 10/01/2015   Type II diabetes mellitus with complication (HCC) 03/16/2015   Essential hypertension 03/16/2015   ONSET DATE: March 13th, 2025 (date of CVA)  REFERRING DIAG: Z14.73 (ICD-10-CM) - History of ischemic  stroke   THERAPY DIAG:  Muscle weakness (generalized)  Unable to perform basic transfers independently  Decreased independence with transfers  Other abnormalities of gait and mobility  Cerebrovascular accident (CVA) due to occlusion of right posterior cerebral artery (HCC)  Rationale for Evaluation and Treatment: Rehabilitation  SUBJECTIVE:                                                                                                                                                                                             SUBJECTIVE STATEMENT: Pt is a 73 year old male presenting to physical therapy with mobility concerns s/p ischemic (multifocal, R occipital) stroke on March  13th, 2025. Pt reports left sided strength deficits affecting his mobility. Pt currently using a manual WC to get around and relies on his friend/roommate/tenant and a caretaker (who comes in the morning and at night) for assistance with transfers, feeding, dressing, and standing. Pt would like to increase his strength so that he can return to walking, which he states that he was able to do with use of SPC prior to stroke.   PERTINENT HISTORY: Pt had physical therapy for one month prior but came to clinic because of access to // bars with goals of walking. Pt has previous history of DVT, stage III CKD, type II DM, and HTN.   PAIN:  Are you having pain? No  PRECAUTIONS: None  RED FLAGS: None   WEIGHT BEARING RESTRICTIONS: No  FALLS: Has patient fallen in last 6 months? No  LIVING ENVIRONMENT: Lives with: his friend/tenant  Lives in: House/apartment Stairs: Yes: External: 2 steps; can reach both Has following equipment at home: Single point cane, Walker - 2 wheeled, Wheelchair (manual), and bed side commode  PLOF: Independent with basic ADLs, Independent with household mobility with device, and Independent with community mobility without device  PATIENT GOALS: To increase LE strength in order to be able  to walk with SPC  OBJECTIVE:  Note: Objective measures were completed at Evaluation unless otherwise noted.  DIAGNOSTIC FINDINGS: N/A  COGNITION: Overall cognitive status: Within functional limits for tasks assessed   SENSATION: Light touch: WFL (upper and lower dermatomes WNL)  COORDINATION: Finger to nose: impaired  Heel to shin: WFL (LLE limited by strength)  Rapid alternating movements: Alternating heel taps: WFL Alternating hand turns: WFL (limited by LUE strength)  MUSCLE LENGTH: Knee flexion contractures noted bilaterally   POSTURE: rounded shoulders, forward head, increased thoracic kyphosis, and weight shift right  UPPER EXTREMITY ROM:     Active  Right Eval Left Eval  Shoulder abduction 114 deg 78 deg   LOWER EXTREMITY MMT:    MMT Right Eval Left Eval  Hip flexion 4- 3  Hip extension    Hip abduction 4- 3  Hip adduction 4- 4-  Hip internal rotation    Hip external rotation    Knee flexion 3+ 3+  Knee extension 4 3  Ankle dorsiflexion 4- 3+  Ankle plantarflexion    Ankle inversion    Ankle eversion    (Blank rows = not tested)  UPPER EXTREMITY MMT: MMT Right eval Left eval  Shoulder flexion 4 3  Shoulder extension    Shoulder abduction 4- 3  Shoulder adduction    Shoulder extension    Shoulder internal rotation 4 3  Shoulder external rotation 4 3  Middle trapezius    Lower trapezius    Elbow flexion 4 3+  Elbow extension 4- 3  Wrist flexion    Wrist extension    Wrist ulnar deviation    Wrist radial deviation    Wrist pronation 4+ 4-  Wrist supination 4+ 4-  Grip strength 38.5# 47.2#   (Blank rows = not tested)   BED MOBILITY:  Findings: Sit to supine Complete Independence Supine to sit CGA  TRANSFERS: Sit to stand: Mod A  Assistive device utilized: Wheelchair (manual)     Stand to sit: CGA  Assistive device utilized: Grab bars      GAIT: Findings: Gait Characteristics: step to pattern, decreased step length- Left,  decreased stance time- Left, decreased stride length, decreased hip/knee flexion- Left, decreased ankle dorsiflexion- Left, trunk flexed, narrow  BOS, and poor foot clearance- Left, Distance walked: 40 ft in // bars, Assistive device utilized:// bars, Level of assistance: Min A, and Comments: Required verbal and tactile cueing keep trunk upright and prevent LLE from adducting past midline  FUNCTIONAL TESTS:  Will assess next session  PATIENT SURVEYS:  Will assess next session  05/09/24 Left Knee extension: -2 deg actively, 0 deg passively Right Knee extension: -9 deg actively, -1 deg passively Left hip flexion AROM: 117 deg Right hip flexion AROM: 113 deg Left shoulder flexion AROM: 130 deg Right shoulder flexion AROM: WNL Left shoulder abduction AROM: 117 deg Right shoulder abduction AROM: WNL                                                                                                                   TREATMENT DATE: 06/03/2024  Subjective: Pt arrived to PT in w/c with no new complaints.  No c/o pain.  Pt reports continued adherence to his HEP this week and states that his transfers continue to get better at home. Pt states that he has been sleeping better over the past ~2 weeks since switching from the hospital bed to his own bed.   Therapeutic Activity:  Forward and backwards walking in // bars, 2 passes down and back, CGA from therapist for maintaining balance, safe turns, and tactile cues.  Pt amb with increased step length while maintaining BOS while in //-bars.     Lateral walking in // bars, 2 passes down and back, CGA from therapist for maintaining balance and verbal cues to maintain upright posture.  Marked fatigue/ requested seated break after each pass.     Forward ambulation with RW outside of //-bars in gym/ hallway for 64 feet. No LOB. Stand to sit transfer required CGA from therapist for controlled descent to chair   W/c to car (passenger side) transfer with mod A.   Pt. Prefers to pull up on car door ith window open.  Limited standing tolerance/ knee extension requiring increase assist from PT.  Pt. Able to get B LE in car once sitting on passenger chair.   Standing in // bars without bilateral UE support and reaching overhead, 5 sets to failure (best ~10-12 seconds) CGA from therapist  Therapeutic Exercise:  Nustep L4 B UE/LE (cuing to maintain L LE in midline)- 10 min.  0.24 miles.  Moderate cuing to correct L LE midline position.   06/03/2024 MMT Right Left  Shoulder flexion 4 4-  Shoulder extension    Shoulder abduction 4- 4-  Shoulder adduction    Shoulder extension    Shoulder internal rotation 4 3+  Shoulder external rotation 4 4-   Active shoulder flexion of left shoulder measured at 135 degrees    Not performed today:  Sit to stands from raised up blue mat table with focus on pushing off triceps to assist with transfer rather than pulling off an object, 5 total attempts. Final attempt patient was able to stand upright momentarily.   PATIENT EDUCATION: Education  details: Pt educated on diagnosis, prognosis, HEP, and POC Person educated: Patient Education method: Explanation, Demonstration, Tactile cues, and Verbal cues Education comprehension: verbalized understanding and returned demonstration  HOME EXERCISE PROGRAM: Access Code: [email or web address] URL: [email or web address] Date: 05/13/2024 Prepared by: Corey Howard Exercises - Seated March - 2 x daily - 7 x weekly - 2 sets - 15 reps - Seated Long Arc Quad - 2 x daily - 7 x weekly - 2 sets - 15 reps - Seated Heel Raise - 2 x daily - 7 x weekly - 2 sets - 15 reps - Seated Elbow Extension with Self-Anchored Resistance - 2 x daily - 7 x weekly - 2 sets - 15 reps - Sit to Stand with Armchair - 1 x daily - 7 x weekly - 3 sets - 10 reps   GOALS: Goals reviewed with patient? Yes  SHORT TERM GOALS: Target date: 06/02/2024  Pt to improve gross LUE strength to at least a 3+/5 in order  to achieve greater independence with functional transfers.  Baseline: see above Goal status: GOAL MET  2.  Pt to improve left shoulder elevation AROM to at least 105 degrees to become symmetrical with RUE and have greater ease with reaching objects at home.  Baseline: left shoulder AROM: 78 deg; left shoulder AROM: 135 deg;  Goal status: GOAL MET  LONG TERM GOALS: Target date: 06/30/2024  Pt to be able to ambulate household distances (at least 300 ft) with use of RW and no more than CGA from therapist so that he can become independent with daily ADLs.  Baseline: nonambulatory with use of manual WC Goal status: INITIAL  2.  Pt to improve gross LLE strength to at least a 3+/5 in order to assist with forward ambulation in // bars or with RW so pt can become more independent with daily ADLs.  Baseline: see above Goal status: INITIAL  3.  Pt will require no greater than CGA with sit to stand transfers so that he is able to safely sit to stand at home with less assistance from his caretaker.  Baseline: mod A Goal status: INITIAL  ASSESSMENT:  CLINICAL IMPRESSION: Focus of today's tx session was to work on improving tolerance to activity and cardiovascular/global muscular endurance to activity. Pt ambulated with RW outside // bars with CGA from therapist for 64 feet on this day and CGA from therapist for controlled descent. Pt continues to demonstrate improvement with stand to sit transfers and is able to reach back for seat and control descent without verbal or tactile cues. Pt required shorter duration seated rest breaks between activities/sets on this day indicating improvement in endurance capacity of bilateral LEs. Updated UE AROM and MMTs on this day (see above). Pt. will continue to benefit from skilled PT services to increase LE strength, transfers/gait with appropriate assistive device.    OBJECTIVE IMPAIRMENTS: Abnormal gait, decreased activity tolerance, decreased balance, decreased  coordination, decreased endurance, decreased mobility, difficulty walking, decreased ROM, decreased strength, decreased safety awareness, impaired flexibility, and impaired UE functional use.   ACTIVITY LIMITATIONS: carrying, lifting, bending, standing, squatting, stairs, transfers, bathing, toileting, dressing, self feeding, reach over head, hygiene/grooming, and locomotion level  PARTICIPATION LIMITATIONS: meal prep, cleaning, laundry, driving, shopping, community activity, and yard work  PERSONAL FACTORS: Age, Fitness, Transportation, and 3+ comorbidities: T2DM, A-fib, HTN, Stage III CKD are also affecting patient's functional outcome.   REHAB POTENTIAL: Good  CLINICAL DECISION MAKING: Evolving/moderate complexity  EVALUATION COMPLEXITY: High  PLAN:  PT FREQUENCY: 2x/week  PT DURATION: 8 weeks  PLANNED INTERVENTIONS: 97164- PT Re-evaluation, 97110-Therapeutic exercises, 97530- Therapeutic activity, 97112- Neuromuscular re-education, 97535- Self Care, [id number]- Manual therapy, Patient/Family education, DME instructions, and Wheelchair mobility training  PLAN FOR NEXT SESSION: Continue transfer training with focus on controlled descent and UE pushoff and weight shifting anteriorly. Improve general endurance capacity/tolerance to activity.  10th visit progress note.   Corey Howard, SPT  Corey Howard, PT, DPT # [phone] 06/03/2024, 12:07 PM

## 2024-06-06 ENCOUNTER — Encounter: Payer: Self-pay | Admitting: Physical Therapy

## 2024-06-07 ENCOUNTER — Ambulatory Visit: Admitting: Physical Therapy

## 2024-06-07 NOTE — Therapy (Incomplete)
 OUTPATIENT PHYSICAL THERAPY NEURO TREATMENT  Patient Name: Corey Howard MRN: [id number] DOB:1950/10/04, 73 y.o., male Today's Date: 06/07/2024  REFERRING PROVIDER: Justus Leita DEL, MD  END OF SESSION:   Past Medical History:  Diagnosis Date   Diabetes mellitus without complication (HCC)    DVT (deep venous thrombosis) (HCC)    History of DVT (deep vein thrombosis)    Hyperlipidemia    Past Surgical History:  Procedure Laterality Date   TONSILLECTOMY     Patient Active Problem List   Diagnosis Date Noted   Ambulatory dysfunction 01/27/2024   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) 01/27/2024   Paroxysmal atrial fibrillation (HCC) 01/27/2024   Carotid artery stenosis 01/27/2024   Aortic atherosclerosis 01/25/2024   Facial weakness following cerebrovascular accident (CVA) 11/02/2023   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 11/02/2023   Oropharyngeal dysphagia 11/02/2023   Long term current use of anticoagulant therapy 11/02/2023   History of ischemic stroke 10/25/2023   Acquired thrombophilia 06/08/2023   Antiphospholipid antibody syndrome 03/04/2023   Neuropathy 03/04/2023   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   History of Bell's palsy    Tobacco abuse 01/27/2017   Seborrhea 10/01/2015   Type II diabetes mellitus with complication (HCC) 03/16/2015   Essential hypertension 03/16/2015   ONSET DATE: March 13th, 2025 (date of CVA)  REFERRING DIAG: Z86.73 (ICD-10-CM) - History of ischemic stroke   THERAPY DIAG:  No diagnosis found.  Rationale for Evaluation and Treatment: Rehabilitation  SUBJECTIVE:                                                                                                                                                                                             SUBJECTIVE STATEMENT: Pt is a 73 year old male presenting to physical therapy with mobility concerns s/p ischemic  (multifocal, R occipital) stroke on March 13th, 2025. Pt reports left sided strength deficits affecting his mobility. Pt currently using a manual WC to get around and relies on his friend/roommate/tenant and a caretaker (who comes in the morning and at night) for assistance with transfers, feeding, dressing, and standing. Pt would like to increase his strength so that he can return to walking, which he states that he was able to do with use of SPC prior to stroke.   PERTINENT HISTORY: Pt had physical therapy for one month prior but came to clinic because of access to // bars with goals of walking. Pt has previous history of DVT, stage III CKD, type II DM, and HTN.   PAIN:  Are you having pain? No  PRECAUTIONS: None  RED FLAGS: None   WEIGHT BEARING RESTRICTIONS: No  FALLS: Has patient fallen in last 6 months? No  LIVING ENVIRONMENT: Lives with: his friend/tenant  Lives in: House/apartment Stairs: Yes: External: 2 steps; can reach both Has following equipment at home: Single point cane, Walker - 2 wheeled, Wheelchair (manual), and bed side commode  PLOF: Independent with basic ADLs, Independent with household mobility with device, and Independent with community mobility without device  PATIENT GOALS: To increase LE strength in order to be able to walk with SPC  OBJECTIVE:  Note: Objective measures were completed at Evaluation unless otherwise noted.  DIAGNOSTIC FINDINGS: N/A  COGNITION: Overall cognitive status: Within functional limits for tasks assessed   SENSATION: Light touch: WFL (upper and lower dermatomes WNL)  COORDINATION: Finger to nose: impaired  Heel to shin: WFL (LLE limited by strength)  Rapid alternating movements: Alternating heel taps: WFL Alternating hand turns: WFL (limited by LUE strength)  MUSCLE LENGTH: Knee flexion contractures noted bilaterally   POSTURE: rounded shoulders, forward head, increased thoracic kyphosis, and weight shift  right  UPPER EXTREMITY ROM:     Active  Right Eval Left Eval  Shoulder abduction 114 deg 78 deg   LOWER EXTREMITY MMT:    MMT Right Eval Left Eval  Hip flexion 4- 3  Hip extension    Hip abduction 4- 3  Hip adduction 4- 4-  Hip internal rotation    Hip external rotation    Knee flexion 3+ 3+  Knee extension 4 3  Ankle dorsiflexion 4- 3+  Ankle plantarflexion    Ankle inversion    Ankle eversion    (Blank rows = not tested)  UPPER EXTREMITY MMT: MMT Right eval Left eval  Shoulder flexion 4 3  Shoulder extension    Shoulder abduction 4- 3  Shoulder adduction    Shoulder extension    Shoulder internal rotation 4 3  Shoulder external rotation 4 3  Middle trapezius    Lower trapezius    Elbow flexion 4 3+  Elbow extension 4- 3  Wrist flexion    Wrist extension    Wrist ulnar deviation    Wrist radial deviation    Wrist pronation 4+ 4-  Wrist supination 4+ 4-  Grip strength 38.5# 47.2#   (Blank rows = not tested)   BED MOBILITY:  Findings: Sit to supine Complete Independence Supine to sit CGA  TRANSFERS: Sit to stand: Mod A  Assistive device utilized: Wheelchair (manual)     Stand to sit: CGA  Assistive device utilized: Grab bars      GAIT: Findings: Gait Characteristics: step to pattern, decreased step length- Left, decreased stance time- Left, decreased stride length, decreased hip/knee flexion- Left, decreased ankle dorsiflexion- Left, trunk flexed, narrow BOS, and poor foot clearance- Left, Distance walked: 40 ft in // bars, Assistive device utilized:// bars, Level of assistance: Min A, and Comments: Required verbal and tactile cueing keep trunk upright and prevent LLE from adducting past midline  FUNCTIONAL TESTS:  Will assess next session  PATIENT SURVEYS:  Will assess next session  05/09/24 Left Knee extension: -2 deg actively, 0 deg passively Right Knee extension: -9 deg actively, -1 deg passively Left hip flexion AROM: 117 deg Right hip  flexion AROM: 113 deg Left shoulder flexion AROM: 130 deg Right shoulder flexion AROM: WNL Left shoulder abduction AROM: 117 deg Right shoulder abduction AROM: WNL  06/03/2024 MMT Right Left  Shoulder flexion 4 4-  Shoulder extension  Shoulder abduction 4- 4-  Shoulder adduction    Shoulder extension    Shoulder internal rotation 4 3+  Shoulder external rotation 4 4-   Active shoulder flexion of left shoulder measured at 135 degrees                                                                                                                    TREATMENT DATE: 06/07/2024  Subjective: Pt arrived to PT in w/c with no new complaints.  No c/o pain.  Pt reports continued adherence to his HEP this week and states that his transfers continue to get better at home.   Therapeutic Activity:  Forward and backwards walking in // bars, 2 passes down and back, CGA from therapist for maintaining balance, safe turns, and tactile cues.  Pt amb with increased step length while maintaining BOS while in //-bars.     Lateral walking in // bars, 2 passes down and back, CGA from therapist for maintaining balance and verbal cues to maintain upright posture.  Marked fatigue/ requested seated break after each pass.     Forward ambulation with RW outside of //-bars in gym/ hallway for 64 feet. No LOB. Stand to sit transfer required CGA from therapist for controlled descent to chair   W/c to car (passenger side) transfer with mod A.  Pt. Prefers to pull up on car door ith window open.  Limited standing tolerance/ knee extension requiring increase assist from PT.  Pt. Able to get B LE in car once sitting on passenger chair.   Standing in // bars without bilateral UE support and reaching overhead, 5 sets to failure (best ~10-12 seconds) CGA from therapist  Therapeutic Exercise:  Nustep L4 B UE/LE (cuing to maintain L LE in midline)- 10 min.  0.24 miles.  Moderate cuing to correct L LE midline position.    Tricep extensions  Not performed today:  Sit to stands from raised up blue mat table with focus on pushing off triceps to assist with transfer rather than pulling off an object, 5 total attempts. Final attempt patient was able to stand upright momentarily.   PATIENT EDUCATION: Education details: Pt educated on diagnosis, prognosis, HEP, and POC Person educated: Patient Education method: Explanation, Demonstration, Tactile cues, and Verbal cues Education comprehension: verbalized understanding and returned demonstration  HOME EXERCISE PROGRAM: Access Code: [email or web address] URL: [email or web address] Date: 05/13/2024 Prepared by: Ozell Sero Exercises - Seated March - 2 x daily - 7 x weekly - 2 sets - 15 reps - Seated Long Arc Quad - 2 x daily - 7 x weekly - 2 sets - 15 reps - Seated Heel Raise - 2 x daily - 7 x weekly - 2 sets - 15 reps - Seated Elbow Extension with Self-Anchored Resistance - 2 x daily - 7 x weekly - 2 sets - 15 reps - Sit to Stand with Armchair - 1 x daily - 7 x weekly - 3 sets - 10 reps   GOALS: Goals reviewed with patient?  Yes  SHORT TERM GOALS: Target date: 06/02/2024  Pt to improve gross LUE strength to at least a 3+/5 in order to achieve greater independence with functional transfers.  Baseline: see above Goal status: GOAL MET  2.  Pt to improve left shoulder elevation AROM to at least 105 degrees to become symmetrical with RUE and have greater ease with reaching objects at home.  Baseline: left shoulder AROM: 78 deg; left shoulder AROM: 135 deg;  Goal status: GOAL MET  LONG TERM GOALS: Target date: 06/30/2024  Pt to be able to ambulate household distances (at least 300 ft) with use of RW and no more than CGA from therapist so that he can become independent with daily ADLs.  Baseline: nonambulatory with use of manual WC Goal status: INITIAL  2.  Pt to improve gross LLE strength to at least a 3+/5 in order to assist with forward ambulation in // bars  or with RW so pt can become more independent with daily ADLs.  Baseline: see above Goal status: INITIAL  3.  Pt will require no greater than CGA with sit to stand transfers so that he is able to safely sit to stand at home with less assistance from his caretaker.  Baseline: mod A Goal status: INITIAL  ASSESSMENT:  CLINICAL IMPRESSION: Focus of today's tx session was to work on improving tolerance to activity and cardiovascular/global muscular endurance to activity. Pt ambulated with RW outside // bars with CGA from therapist for 64 feet on this day and CGA from therapist for controlled descent. Pt continues to demonstrate improvement with stand to sit transfers and is able to reach back for seat and control descent without verbal or tactile cues. Pt required shorter duration seated rest breaks between activities/sets on this day indicating improvement in endurance capacity of bilateral LEs. Updated UE AROM and MMTs on this day (see above). Pt. will continue to benefit from skilled PT services to increase LE strength, transfers/gait with appropriate assistive device.    OBJECTIVE IMPAIRMENTS: Abnormal gait, decreased activity tolerance, decreased balance, decreased coordination, decreased endurance, decreased mobility, difficulty walking, decreased ROM, decreased strength, decreased safety awareness, impaired flexibility, and impaired UE functional use.   ACTIVITY LIMITATIONS: carrying, lifting, bending, standing, squatting, stairs, transfers, bathing, toileting, dressing, self feeding, reach over head, hygiene/grooming, and locomotion level  PARTICIPATION LIMITATIONS: meal prep, cleaning, laundry, driving, shopping, community activity, and yard work  PERSONAL FACTORS: Age, Fitness, Transportation, and 3+ comorbidities: T2DM, A-fib, HTN, Stage III CKD are also affecting patient's functional outcome.   REHAB POTENTIAL: Good  CLINICAL DECISION MAKING: Evolving/moderate complexity  EVALUATION  COMPLEXITY: High  PLAN:  PT FREQUENCY: 2x/week  PT DURATION: 8 weeks  PLANNED INTERVENTIONS: 97164- PT Re-evaluation, 97110-Therapeutic exercises, 97530- Therapeutic activity, 97112- Neuromuscular re-education, 97535- Self Care, [id number]- Manual therapy, Patient/Family education, DME instructions, and Wheelchair mobility training  PLAN FOR NEXT SESSION: Continue transfer training with focus on controlled descent and UE pushoff and weight shifting anteriorly. Improve general endurance capacity/tolerance to activity.  10th visit progress note.   Curtistine Bracket, SPT  Ozell JAYSON Sero, PT, DPT # [phone] 06/07/2024, 7:21 AM

## 2024-06-08 ENCOUNTER — Ambulatory Visit: Admitting: Physical Therapy

## 2024-06-08 ENCOUNTER — Encounter: Payer: Self-pay | Admitting: Physical Therapy

## 2024-06-08 DIAGNOSIS — R6889 Other general symptoms and signs: Secondary | ICD-10-CM

## 2024-06-08 DIAGNOSIS — I63531 Cerebral infarction due to unspecified occlusion or stenosis of right posterior cerebral artery: Secondary | ICD-10-CM

## 2024-06-08 DIAGNOSIS — R2689 Other abnormalities of gait and mobility: Secondary | ICD-10-CM

## 2024-06-08 DIAGNOSIS — M6281 Muscle weakness (generalized): Secondary | ICD-10-CM

## 2024-06-08 DIAGNOSIS — Z7409 Other reduced mobility: Secondary | ICD-10-CM

## 2024-06-08 NOTE — Therapy (Signed)
 OUTPATIENT PHYSICAL THERAPY NEURO TREATMENT Physical Therapy Progress Note  Dates of reporting period  9/25   to   06/08/24   Patient Name: Corey Howard MRN: [id number] DOB:04/15/1951, 73 y.o., male Today's Date: 06/08/2024  REFERRING PROVIDER: Justus Leita DEL, MD  END OF SESSION:  PT End of Session - 06/08/24 1244     Visit Number 10    Number of Visits 16    Date for Recertification  06/30/24    PT Start Time 1251    PT Stop Time 1344    PT Time Calculation (min) 53 min    Equipment Utilized During Treatment Gait belt    Activity Tolerance Patient tolerated treatment well;Patient limited by fatigue;No increased pain    Behavior During Therapy WFL for tasks assessed/performed          Past Medical History:  Diagnosis Date   Diabetes mellitus without complication (HCC)    DVT (deep venous thrombosis) (HCC)    History of DVT (deep vein thrombosis)    Hyperlipidemia    Past Surgical History:  Procedure Laterality Date   TONSILLECTOMY     Patient Active Problem List   Diagnosis Date Noted   Ambulatory dysfunction 01/27/2024   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) 01/27/2024   Paroxysmal atrial fibrillation (HCC) 01/27/2024   Carotid artery stenosis 01/27/2024   Aortic atherosclerosis 01/25/2024   Facial weakness following cerebrovascular accident (CVA) 11/02/2023   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 11/02/2023   Oropharyngeal dysphagia 11/02/2023   Long term current use of anticoagulant therapy 11/02/2023   History of ischemic stroke 10/25/2023   Acquired thrombophilia 06/08/2023   Antiphospholipid antibody syndrome 03/04/2023   Neuropathy 03/04/2023   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   History of Bell's palsy    Tobacco abuse 01/27/2017   Seborrhea 10/01/2015   Type II diabetes mellitus with complication (HCC) 03/16/2015   Essential hypertension 03/16/2015   ONSET DATE: March  13th, 2025 (date of CVA)  REFERRING DIAG: Z28.73 (ICD-10-CM) - History of ischemic stroke   THERAPY DIAG:  Muscle weakness (generalized)  Unable to perform basic transfers independently  Decreased independence with transfers  Other abnormalities of gait and mobility  Cerebrovascular accident (CVA) due to occlusion of right posterior cerebral artery (HCC)  Rationale for Evaluation and Treatment: Rehabilitation  SUBJECTIVE:                                                                                                                                                                                             SUBJECTIVE STATEMENT: Pt is a  73 year old male presenting to physical therapy with mobility concerns s/p ischemic (multifocal, R occipital) stroke on March 13th, 2025. Pt reports left sided strength deficits affecting his mobility. Pt currently using a manual WC to get around and relies on his friend/roommate/tenant and a caretaker (who comes in the morning and at night) for assistance with transfers, feeding, dressing, and standing. Pt would like to increase his strength so that he can return to walking, which he states that he was able to do with use of SPC prior to stroke.   PERTINENT HISTORY: Pt had physical therapy for one month prior but came to clinic because of access to // bars with goals of walking. Pt has previous history of DVT, stage III CKD, type II DM, and HTN.   PAIN:  Are you having pain? No  PRECAUTIONS: None  RED FLAGS: None   WEIGHT BEARING RESTRICTIONS: No  FALLS: Has patient fallen in last 6 months? No  LIVING ENVIRONMENT: Lives with: his friend/tenant  Lives in: House/apartment Stairs: Yes: External: 2 steps; can reach both Has following equipment at home: Single point cane, Walker - 2 wheeled, Wheelchair (manual), and bed side commode  PLOF: Independent with basic ADLs, Independent with household mobility with device, and Independent with community  mobility without device  PATIENT GOALS: To increase LE strength in order to be able to walk with SPC  OBJECTIVE:  Note: Objective measures were completed at Evaluation unless otherwise noted.  DIAGNOSTIC FINDINGS: N/A  COGNITION: Overall cognitive status: Within functional limits for tasks assessed   SENSATION: Light touch: WFL (upper and lower dermatomes WNL)  COORDINATION: Finger to nose: impaired  Heel to shin: WFL (LLE limited by strength)  Rapid alternating movements: Alternating heel taps: WFL Alternating hand turns: WFL (limited by LUE strength)  MUSCLE LENGTH: Knee flexion contractures noted bilaterally   POSTURE: rounded shoulders, forward head, increased thoracic kyphosis, and weight shift right  UPPER EXTREMITY ROM:     Active  Right Eval Left Eval  Shoulder abduction 114 deg 78 deg   LOWER EXTREMITY MMT:    MMT Right Eval Left Eval  Hip flexion 4- 3  Hip extension    Hip abduction 4- 3  Hip adduction 4- 4-  Hip internal rotation    Hip external rotation    Knee flexion 3+ 3+  Knee extension 4 3  Ankle dorsiflexion 4- 3+  Ankle plantarflexion    Ankle inversion    Ankle eversion    (Blank rows = not tested)  UPPER EXTREMITY MMT: MMT Right eval Left eval  Shoulder flexion 4 3  Shoulder extension    Shoulder abduction 4- 3  Shoulder adduction    Shoulder extension    Shoulder internal rotation 4 3  Shoulder external rotation 4 3  Middle trapezius    Lower trapezius    Elbow flexion 4 3+  Elbow extension 4- 3  Wrist flexion    Wrist extension    Wrist ulnar deviation    Wrist radial deviation    Wrist pronation 4+ 4-  Wrist supination 4+ 4-  Grip strength 38.5# 47.2#   (Blank rows = not tested)   BED MOBILITY:  Findings: Sit to supine Complete Independence Supine to sit CGA  TRANSFERS: Sit to stand: Mod A  Assistive device utilized: Wheelchair (manual)     Stand to sit: CGA  Assistive device utilized: Grab bars       GAIT: Findings: Gait Characteristics: step to pattern, decreased step length-  Left, decreased stance time- Left, decreased stride length, decreased hip/knee flexion- Left, decreased ankle dorsiflexion- Left, trunk flexed, narrow BOS, and poor foot clearance- Left, Distance walked: 40 ft in // bars, Assistive device utilized:// bars, Level of assistance: Min A, and Comments: Required verbal and tactile cueing keep trunk upright and prevent LLE from adducting past midline  FUNCTIONAL TESTS:  Will assess next session  PATIENT SURVEYS:  Will assess next session  05/09/24 Left Knee extension: -2 deg actively, 0 deg passively Right Knee extension: -9 deg actively, -1 deg passively Left hip flexion AROM: 117 deg Right hip flexion AROM: 113 deg Left shoulder flexion AROM: 130 deg Right shoulder flexion AROM: WNL Left shoulder abduction AROM: 117 deg Right shoulder abduction AROM: WNL  06/03/2024 MMT Right Left  Shoulder flexion 4 4-  Shoulder extension    Shoulder abduction 4- 4-  Shoulder adduction    Shoulder extension    Shoulder internal rotation 4 3+  Shoulder external rotation 4 4-   Active shoulder flexion of left shoulder measured at 135 degrees                                                                                                                    TREATMENT DATE: 06/08/2024  Subjective: Pt arrived to PT in w/c with no new complaints.  No c/o pain.  Pt reports continued adherence to his HEP this week and states that he has been working on stand pivot transfers at home with his caregiver.   Therapeutic Activity:  Forward and backwards walking in // bars, 2 passes down and back, CGA from therapist for maintaining balance, safe turns, and tactile cues.  Pt amb with increased step length while maintaining BOS while in //-bars.     Forward ambulation with RW outside of //-bars in gym/ hallway for 64 feet. No LOB. Stand to sit transfer required CGA from therapist for  controlled descent to chair   Standing in // bars without bilateral UE support and reaching  out and overhead, 8 sets to failure (best ~10-12 seconds) CGA from therapist  W/c to car (passenger side) transfer with mod A.  Pt. Prefers to pull up on car door ith window open.  Limited standing tolerance/ knee extension requiring increase assist from PT.  Pt. Able to get B LE in car once sitting on passenger chair.   Therapeutic Exercise:  Nustep L4 B UE/LE (cuing to maintain L LE in midline)- 10 min.  0.24 miles.  Moderate cuing to correct L LE midline position.   Seated Tricep extensions and Lat Pulldowns on Nautilus, 30#, 2 x 10  Not performed today:  Sit to stands from raised up blue mat table with focus on pushing off triceps to assist with transfer rather than pulling off an object, 5 total attempts. Final attempt patient was able to stand upright momentarily.   Lateral walking in // bars, 2 passes down and back, CGA from therapist for maintaining balance and verbal cues to maintain upright posture.  Marked fatigue/ requested seated break after each pass.     PATIENT EDUCATION: Education details: Pt educated on diagnosis, prognosis, HEP, and POC Person educated: Patient Education method: Explanation, Demonstration, Tactile cues, and Verbal cues Education comprehension: verbalized understanding and returned demonstration  HOME EXERCISE PROGRAM: Access Code: [email or web address] URL: [email or web address] Date: 05/13/2024 Prepared by: Corey Howard Exercises - Seated March - 2 x daily - 7 x weekly - 2 sets - 15 reps - Seated Long Arc Quad - 2 x daily - 7 x weekly - 2 sets - 15 reps - Seated Heel Raise - 2 x daily - 7 x weekly - 2 sets - 15 reps - Seated Elbow Extension with Self-Anchored Resistance - 2 x daily - 7 x weekly - 2 sets - 15 reps - Sit to Stand with Armchair - 1 x daily - 7 x weekly - 3 sets - 10 reps   GOALS: Goals reviewed with patient? Yes  SHORT TERM GOALS: Target date:  06/02/2024  Pt to improve gross LUE strength to at least a 3+/5 in order to achieve greater independence with functional transfers.  Baseline: see above Goal status: GOAL MET  2.  Pt to improve left shoulder elevation AROM to at least 105 degrees to become symmetrical with RUE and have greater ease with reaching objects at home.  Baseline: left shoulder AROM: 78 deg; left shoulder AROM: 135 deg;  Goal status: GOAL MET  LONG TERM GOALS: Target date: 06/30/2024  Pt to be able to ambulate household distances (at least 300 ft) with use of RW and no more than CGA from therapist so that he can become independent with daily ADLs.  Baseline: nonambulatory with use of manual WC Goal status: INITIAL  2.  Pt to improve gross LLE strength to at least a 3+/5 in order to assist with forward ambulation in // bars or with RW so pt can become more independent with daily ADLs.  Baseline: see above Goal status: INITIAL  3.  Pt will require no greater than CGA with sit to stand transfers so that he is able to safely sit to stand at home with less assistance from his caretaker.  Baseline: mod A Goal status: INITIAL  ASSESSMENT:  CLINICAL IMPRESSION: Pt ambulated with RW outside // bars with CGA from therapist for ~20 feet on this day and CGA from therapist for controlled descent to return to seated position once patient became fatigued. Pt noted to ambulate with upper body leaning to right side and with narrow BOS. Pt was introduced to seated tricep extensions and lat pulldowns on Nautilus which he was able to complete without significant difficulty or pain but did require frequent verbal and tactile cueing to perform the exercises properly and without compensations. Pt. will continue to benefit from skilled PT services to increase LE strength, transfers/gait with appropriate assistive device.    Pt has attended and actively participated in 10 physical therapy treatment sessions to date. Pt has seen  improvements with gross LE strength and endurance, functional usage of left UE, balance, and general, systemic endurance/tolerance to activity. Pt has remaining deficits related to decreased coordination, abnormal gait, decreased endurance, decreased UE and LE strength, and decreased safety awareness. These deficits continue to limit the patients ability to perform functional transfers, perform ADLs, and ambulate household or community distances independently. Pt will continue to benefit from skilled physical therapy intervention to address remaining limitations and maximize functional independence.   OBJECTIVE IMPAIRMENTS: Abnormal gait, decreased activity tolerance, decreased balance,  decreased coordination, decreased endurance, decreased mobility, difficulty walking, decreased ROM, decreased strength, decreased safety awareness, impaired flexibility, and impaired UE functional use.   ACTIVITY LIMITATIONS: carrying, lifting, bending, standing, squatting, stairs, transfers, bathing, toileting, dressing, self feeding, reach over head, hygiene/grooming, and locomotion level  PARTICIPATION LIMITATIONS: meal prep, cleaning, laundry, driving, shopping, community activity, and yard work  PERSONAL FACTORS: Age, Fitness, Transportation, and 3+ comorbidities: T2DM, A-fib, HTN, Stage III CKD are also affecting patient's functional outcome.   REHAB POTENTIAL: Good  CLINICAL DECISION MAKING: Evolving/moderate complexity  EVALUATION COMPLEXITY: High  PLAN:  PT FREQUENCY: 2x/week  PT DURATION: 8 weeks  PLANNED INTERVENTIONS: 97164- PT Re-evaluation, 97110-Therapeutic exercises, 97530- Therapeutic activity, 97112- Neuromuscular re-education, 97535- Self Care, [id number]- Manual therapy, Patient/Family education, DME instructions, and Wheelchair mobility training  PLAN FOR NEXT SESSION: Continue transfer training with focus on controlled descent and UE pushoff and weight shifting anteriorly. Improve general  endurance capacity/tolerance to activity.    Corey Howard, SPT  Corey Howard, PT, DPT # [phone] 06/08/2024, 2:22 PM

## 2024-06-10 ENCOUNTER — Ambulatory Visit: Admitting: Physical Therapy

## 2024-06-10 ENCOUNTER — Encounter: Payer: Self-pay | Admitting: Physical Therapy

## 2024-06-10 DIAGNOSIS — M6281 Muscle weakness (generalized): Secondary | ICD-10-CM

## 2024-06-10 DIAGNOSIS — I63531 Cerebral infarction due to unspecified occlusion or stenosis of right posterior cerebral artery: Secondary | ICD-10-CM

## 2024-06-10 DIAGNOSIS — R6889 Other general symptoms and signs: Secondary | ICD-10-CM

## 2024-06-10 DIAGNOSIS — Z7409 Other reduced mobility: Secondary | ICD-10-CM

## 2024-06-10 DIAGNOSIS — R2689 Other abnormalities of gait and mobility: Secondary | ICD-10-CM

## 2024-06-10 NOTE — Therapy (Signed)
 OUTPATIENT PHYSICAL THERAPY NEURO TREATMENT  Patient Name: Corey Howard MRN: [id number] DOB:Apr 30, 1951, 73 y.o., male Today's Date: 06/10/2024  REFERRING PROVIDER: Justus Leita DEL, MD  END OF SESSION:  PT End of Session - 06/10/24 1103     Visit Number 11    Number of Visits 16    Date for Recertification  06/30/24    PT Start Time 1030    PT Stop Time 1115    PT Time Calculation (min) 45 min    Equipment Utilized During Treatment Gait belt    Activity Tolerance Patient tolerated treatment well;Patient limited by fatigue;No increased pain    Behavior During Therapy WFL for tasks assessed/performed          Past Medical History:  Diagnosis Date   Diabetes mellitus without complication (HCC)    DVT (deep venous thrombosis) (HCC)    History of DVT (deep vein thrombosis)    Hyperlipidemia    Past Surgical History:  Procedure Laterality Date   TONSILLECTOMY     Patient Active Problem List   Diagnosis Date Noted   Ambulatory dysfunction 01/27/2024   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) 01/27/2024   Paroxysmal atrial fibrillation (HCC) 01/27/2024   Carotid artery stenosis 01/27/2024   Aortic atherosclerosis 01/25/2024   Facial weakness following cerebrovascular accident (CVA) 11/02/2023   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 11/02/2023   Oropharyngeal dysphagia 11/02/2023   Long term current use of anticoagulant therapy 11/02/2023   History of ischemic stroke 10/25/2023   Acquired thrombophilia 06/08/2023   Antiphospholipid antibody syndrome 03/04/2023   Neuropathy 03/04/2023   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   History of Bell's palsy    Tobacco abuse 01/27/2017   Seborrhea 10/01/2015   Type II diabetes mellitus with complication (HCC) 03/16/2015   Essential hypertension 03/16/2015   ONSET DATE: March 13th, 2025 (date of CVA)  REFERRING DIAG: Z18.73 (ICD-10-CM) - History of ischemic  stroke   THERAPY DIAG:  Muscle weakness (generalized)  Decreased independence with transfers  Unable to perform basic transfers independently  Other abnormalities of gait and mobility  Cerebrovascular accident (CVA) due to occlusion of right posterior cerebral artery (HCC)  Rationale for Evaluation and Treatment: Rehabilitation  SUBJECTIVE:                                                                                                                                                                                             SUBJECTIVE STATEMENT: Pt is a 73 year old male presenting to physical therapy with mobility concerns s/p ischemic (multifocal, R occipital) stroke on  March 13th, 2025. Pt reports left sided strength deficits affecting his mobility. Pt currently using a manual WC to get around and relies on his friend/roommate/tenant and a caretaker (who comes in the morning and at night) for assistance with transfers, feeding, dressing, and standing. Pt would like to increase his strength so that he can return to walking, which he states that he was able to do with use of SPC prior to stroke.   PERTINENT HISTORY: Pt had physical therapy for one month prior but came to clinic because of access to // bars with goals of walking. Pt has previous history of DVT, stage III CKD, type II DM, and HTN.   PAIN:  Are you having pain? No  PRECAUTIONS: None  RED FLAGS: None   WEIGHT BEARING RESTRICTIONS: No  FALLS: Has patient fallen in last 6 months? No  LIVING ENVIRONMENT: Lives with: his friend/tenant  Lives in: House/apartment Stairs: Yes: External: 2 steps; can reach both Has following equipment at home: Single point cane, Walker - 2 wheeled, Wheelchair (manual), and bed side commode  PLOF: Independent with basic ADLs, Independent with household mobility with device, and Independent with community mobility without device  PATIENT GOALS: To increase LE strength in order to be able  to walk with SPC  OBJECTIVE:  Note: Objective measures were completed at Evaluation unless otherwise noted.  DIAGNOSTIC FINDINGS: N/A  COGNITION: Overall cognitive status: Within functional limits for tasks assessed   SENSATION: Light touch: WFL (upper and lower dermatomes WNL)  COORDINATION: Finger to nose: impaired  Heel to shin: WFL (LLE limited by strength)  Rapid alternating movements: Alternating heel taps: WFL Alternating hand turns: WFL (limited by LUE strength)  MUSCLE LENGTH: Knee flexion contractures noted bilaterally   POSTURE: rounded shoulders, forward head, increased thoracic kyphosis, and weight shift right  UPPER EXTREMITY ROM:     Active  Right Eval Left Eval  Shoulder abduction 114 deg 78 deg   LOWER EXTREMITY MMT:    MMT Right Eval Left Eval  Hip flexion 4- 3  Hip extension    Hip abduction 4- 3  Hip adduction 4- 4-  Hip internal rotation    Hip external rotation    Knee flexion 3+ 3+  Knee extension 4 3  Ankle dorsiflexion 4- 3+  Ankle plantarflexion    Ankle inversion    Ankle eversion    (Blank rows = not tested)  UPPER EXTREMITY MMT: MMT Right eval Left eval  Shoulder flexion 4 3  Shoulder extension    Shoulder abduction 4- 3  Shoulder adduction    Shoulder extension    Shoulder internal rotation 4 3  Shoulder external rotation 4 3  Middle trapezius    Lower trapezius    Elbow flexion 4 3+  Elbow extension 4- 3  Wrist flexion    Wrist extension    Wrist ulnar deviation    Wrist radial deviation    Wrist pronation 4+ 4-  Wrist supination 4+ 4-  Grip strength 38.5# 47.2#   (Blank rows = not tested)   BED MOBILITY:  Findings: Sit to supine Complete Independence Supine to sit CGA  TRANSFERS: Sit to stand: Mod A  Assistive device utilized: Wheelchair (manual)     Stand to sit: CGA  Assistive device utilized: Grab bars      GAIT: Findings: Gait Characteristics: step to pattern, decreased step length- Left,  decreased stance time- Left, decreased stride length, decreased hip/knee flexion- Left, decreased ankle dorsiflexion- Left, trunk flexed,  narrow BOS, and poor foot clearance- Left, Distance walked: 40 ft in // bars, Assistive device utilized:// bars, Level of assistance: Min A, and Comments: Required verbal and tactile cueing keep trunk upright and prevent LLE from adducting past midline  FUNCTIONAL TESTS:  Will assess next session  PATIENT SURVEYS:  Will assess next session  05/09/24 Left Knee extension: -2 deg actively, 0 deg passively Right Knee extension: -9 deg actively, -1 deg passively Left hip flexion AROM: 117 deg Right hip flexion AROM: 113 deg Left shoulder flexion AROM: 130 deg Right shoulder flexion AROM: WNL Left shoulder abduction AROM: 117 deg Right shoulder abduction AROM: WNL  06/03/2024 MMT Right Left  Shoulder flexion 4 4-  Shoulder extension    Shoulder abduction 4- 4-  Shoulder adduction    Shoulder extension    Shoulder internal rotation 4 3+  Shoulder external rotation 4 4-   Active shoulder flexion of left shoulder measured at 135 degrees                                                                                                                    TREATMENT DATE: 06/10/2024  Subjective: Pt arrived to PT in w/c with no new complaints.  No c/o pain.  Pt reports continued adherence to his HEP this week and states that he has been working on stand pivot transfers at home with his caregiver.   Therapeutic Activity:  Forward and lateral walking in // bars, 2 passes down and back, CGA from therapist for maintaining balance, safe turns, and tactile cues.  Pt is SBA for controlled descent during stand to sit transfers.     Forward ambulation with RW outside of //-bars in gym/ hallway for 81 feet. No LOB. Stand to sit transfer required CGA from therapist for controlled descent to chair   Standing in // bars without bilateral UE support and functional reaching  for anterior cones outside of BOS, 1 x 10 total cones, CGA from therapist  W/c to car (passenger side) transfer with mod A.  Pt. Prefers to pull up on car door ith window open.  Limited standing tolerance/ knee extension requiring increase assist from PT.  Pt. Able to get B LE in car once sitting on passenger chair.   Therapeutic Exercise:  Nustep L4 B UE/LE (cuing to maintain L LE in midline)- 10 min.  0.24 miles.  Moderate cuing to correct L LE midline position.   Seated Tricep extensions and Lat Pulldowns on Nautilus, 30#, 3 x 12  Not performed today:  Sit to stands from raised up blue mat table with focus on pushing off triceps to assist with transfer rather than pulling off an object, 5 total attempts. Final attempt patient was able to stand upright momentarily.   Lateral walking in // bars, 2 passes down and back, CGA from therapist for maintaining balance and verbal cues to maintain upright posture.  Marked fatigue/ requested seated break after each pass.     PATIENT EDUCATION: Education details: Pt  educated on diagnosis, prognosis, HEP, and POC Person educated: Patient Education method: Explanation, Demonstration, Tactile cues, and Verbal cues Education comprehension: verbalized understanding and returned demonstration  HOME EXERCISE PROGRAM: Access Code: [email or web address] URL: [email or web address] Date: 05/13/2024 Prepared by: Ozell Sero Exercises - Seated March - 2 x daily - 7 x weekly - 2 sets - 15 reps - Seated Long Arc Quad - 2 x daily - 7 x weekly - 2 sets - 15 reps - Seated Heel Raise - 2 x daily - 7 x weekly - 2 sets - 15 reps - Seated Elbow Extension with Self-Anchored Resistance - 2 x daily - 7 x weekly - 2 sets - 15 reps - Sit to Stand with Armchair - 1 x daily - 7 x weekly - 3 sets - 10 reps   GOALS: Goals reviewed with patient? Yes  SHORT TERM GOALS: Target date: 06/02/2024  Pt to improve gross LUE strength to at least a 3+/5 in order to achieve greater  independence with functional transfers.  Baseline: see above Goal status: GOAL MET  2.  Pt to improve left shoulder elevation AROM to at least 105 degrees to become symmetrical with RUE and have greater ease with reaching objects at home.  Baseline: left shoulder AROM: 78 deg; left shoulder AROM: 135 deg;  Goal status: GOAL MET  LONG TERM GOALS: Target date: 06/30/2024  Pt to be able to ambulate household distances (at least 300 ft) with use of RW and no more than CGA from therapist so that he can become independent with daily ADLs.  Baseline: nonambulatory with use of manual WC Goal status: INITIAL  2.  Pt to improve gross LLE strength to at least a 3+/5 in order to assist with forward ambulation in // bars or with RW so pt can become more independent with daily ADLs.  Baseline: see above Goal status: INITIAL  3.  Pt will require no greater than CGA with sit to stand transfers so that he is able to safely sit to stand at home with less assistance from his caretaker.  Baseline: mod A Goal status: INITIAL  ASSESSMENT:  CLINICAL IMPRESSION: Pt ambulated with RW outside // bars with CGA from therapist for ~81 feet on this day before requesting to sit secondary to general, systemic fatigue. Pt did require min A during ambulation for safe turns with RW. Pt introduced to functional reaching tasks for anterior cones outside of BOS while standing in // bars without UE assistance which he tolerated well. Pt able to reach for cone and return to midline two times before requiring use of UE support on handrail or min A from therapist to maintain upright balance (~8 seconds). Pt continues to have tendency to lean upper body posteriorly during standing and ambulation activities and requires frequent verbal and tactile cueing to correct. Pt demonstrating improvement with transfers and now only requires stand by for controlled descent during stand to sit transfers. Pt was progressed to completing seated  tricep extensions on nautilus for increased sets/reps on this day and noted to demonstrate improved technique, but still requires significant verbal and tactile cueing to prevent elbows from flairing out laterally. Pt. will continue to benefit from skilled PT services to increase LE strength, transfers/gait with appropriate assistive device.    OBJECTIVE IMPAIRMENTS: Abnormal gait, decreased activity tolerance, decreased balance, decreased coordination, decreased endurance, decreased mobility, difficulty walking, decreased ROM, decreased strength, decreased safety awareness, impaired flexibility, and impaired UE functional use.   ACTIVITY LIMITATIONS: carrying, lifting, bending,  standing, squatting, stairs, transfers, bathing, toileting, dressing, self feeding, reach over head, hygiene/grooming, and locomotion level  PARTICIPATION LIMITATIONS: meal prep, cleaning, laundry, driving, shopping, community activity, and yard work  PERSONAL FACTORS: Age, Fitness, Transportation, and 3+ comorbidities: T2DM, A-fib, HTN, Stage III CKD are also affecting patient's functional outcome.   REHAB POTENTIAL: Good  CLINICAL DECISION MAKING: Evolving/moderate complexity  EVALUATION COMPLEXITY: High  PLAN:  PT FREQUENCY: 2x/week  PT DURATION: 8 weeks  PLANNED INTERVENTIONS: 97164- PT Re-evaluation, 97110-Therapeutic exercises, 97530- Therapeutic activity, 97112- Neuromuscular re-education, 97535- Self Care, [id number]- Manual therapy, Patient/Family education, DME instructions, and Wheelchair mobility training  PLAN FOR NEXT SESSION: Continue transfer training with focus on controlled descent and UE pushoff and weight shifting anteriorly. Improve general endurance capacity/tolerance to activity.    Curtistine Bracket, SPT  Ozell JAYSON Sero, PT, DPT # [id number] 06/10/2024, 11:04 AM

## 2024-06-13 ENCOUNTER — Ambulatory Visit: Attending: Internal Medicine | Admitting: Physical Therapy

## 2024-06-13 ENCOUNTER — Telehealth: Payer: Self-pay

## 2024-06-13 DIAGNOSIS — Z7409 Other reduced mobility: Secondary | ICD-10-CM | POA: Insufficient documentation

## 2024-06-13 DIAGNOSIS — I63531 Cerebral infarction due to unspecified occlusion or stenosis of right posterior cerebral artery: Secondary | ICD-10-CM | POA: Insufficient documentation

## 2024-06-13 DIAGNOSIS — M6281 Muscle weakness (generalized): Secondary | ICD-10-CM | POA: Insufficient documentation

## 2024-06-13 DIAGNOSIS — R2689 Other abnormalities of gait and mobility: Secondary | ICD-10-CM | POA: Insufficient documentation

## 2024-06-13 DIAGNOSIS — R6889 Other general symptoms and signs: Secondary | ICD-10-CM | POA: Insufficient documentation

## 2024-06-13 NOTE — Telephone Encounter (Signed)
-----   Message from Leita Adie sent at 06/07/2024  3:20 PM EDT ----- Regarding: RE: referral Please see if you can get a hold of this patient.  It is very important that he establish care with a Cardiologist but they have been unable to reach him. ----- Message ----- From: Schools, Tinnie NOVAK Sent: 06/07/2024   2:48 PM EDT To: Leita VEAR Adie, MD Subject: referral                                       Hello,  We have made several attempts to get this patient scheduled with no success. We sent the patient a letter and have removed them from the work queue.  Thank you,  [facility]

## 2024-06-13 NOTE — Telephone Encounter (Signed)
 Called patient and left VM informing that is it very important for him to see the heart doctor per Dr Justus. Asked him to call them back and schedule an appt as soon as possible.  - CM

## 2024-06-14 ENCOUNTER — Ambulatory Visit

## 2024-06-14 DIAGNOSIS — R2689 Other abnormalities of gait and mobility: Secondary | ICD-10-CM

## 2024-06-14 DIAGNOSIS — M6281 Muscle weakness (generalized): Secondary | ICD-10-CM | POA: Diagnosis present

## 2024-06-14 DIAGNOSIS — R6889 Other general symptoms and signs: Secondary | ICD-10-CM | POA: Diagnosis present

## 2024-06-14 DIAGNOSIS — Z7409 Other reduced mobility: Secondary | ICD-10-CM | POA: Diagnosis present

## 2024-06-14 DIAGNOSIS — I63531 Cerebral infarction due to unspecified occlusion or stenosis of right posterior cerebral artery: Secondary | ICD-10-CM | POA: Diagnosis present

## 2024-06-14 NOTE — Therapy (Signed)
 OUTPATIENT PHYSICAL THERAPY NEURO TREATMENT  Patient Name: Corey Howard MRN: [id number] DOB:[birth year], 73 y.o., male Today's Date: 06/14/2024  REFERRING PROVIDER: Justus Leita DEL, MD  END OF SESSION:  PT End of Session - 06/14/24 1100     Visit Number 12    Number of Visits 16    Date for Recertification  06/30/24    PT Start Time 1100    PT Stop Time 1145    PT Time Calculation (min) 45 min    Equipment Utilized During Treatment Gait belt    Activity Tolerance Patient tolerated treatment well;Patient limited by fatigue;No increased pain    Behavior During Therapy WFL for tasks assessed/performed           Past Medical History:  Diagnosis Date   Diabetes mellitus without complication (HCC)    DVT (deep venous thrombosis) (HCC)    History of DVT (deep vein thrombosis)    Hyperlipidemia    Past Surgical History:  Procedure Laterality Date   TONSILLECTOMY     Patient Active Problem List   Diagnosis Date Noted   Ambulatory dysfunction 01/27/2024   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) 01/27/2024   Paroxysmal atrial fibrillation (HCC) 01/27/2024   Carotid artery stenosis 01/27/2024   Aortic atherosclerosis 01/25/2024   Facial weakness following cerebrovascular accident (CVA) 11/02/2023   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 11/02/2023   Oropharyngeal dysphagia 11/02/2023   Long term current use of anticoagulant therapy 11/02/2023   History of ischemic stroke 10/25/2023   Acquired thrombophilia 06/08/2023   Antiphospholipid antibody syndrome 03/04/2023   Neuropathy 03/04/2023   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   History of Bell's palsy    Tobacco abuse 01/27/2017   Seborrhea 10/01/2015   Type II diabetes mellitus with complication (HCC) 03/16/2015   Essential hypertension 03/16/2015   ONSET DATE: March 13th, 2025 (date of CVA)  REFERRING DIAG: Z52.73 (ICD-10-CM) - History of ischemic  stroke   THERAPY DIAG:  Muscle weakness (generalized)  Other abnormalities of gait and mobility  Rationale for Evaluation and Treatment: Rehabilitation  SUBJECTIVE:                                                                                                                                                                                             SUBJECTIVE STATEMENT: Pt is a 73 year old male presenting to physical therapy with mobility concerns s/p ischemic (multifocal, R occipital) stroke on March 13th, 2025. Pt reports left sided strength deficits affecting his mobility. Pt currently using a manual WC to get around and relies on  his friend/roommate/tenant and a caretaker (who comes in the morning and at night) for assistance with transfers, feeding, dressing, and standing. Pt would like to increase his strength so that he can return to walking, which he states that he was able to do with use of SPC prior to stroke.   PERTINENT HISTORY: Pt had physical therapy for one month prior but came to clinic because of access to // bars with goals of walking. Pt has previous history of DVT, stage III CKD, type II DM, and HTN.   PAIN:  Are you having pain? No  PRECAUTIONS: None  RED FLAGS: None   WEIGHT BEARING RESTRICTIONS: No  FALLS: Has patient fallen in last 6 months? No  LIVING ENVIRONMENT: Lives with: his friend/tenant  Lives in: House/apartment Stairs: Yes: External: 2 steps; can reach both Has following equipment at home: Single point cane, Walker - 2 wheeled, Wheelchair (manual), and bed side commode  PLOF: Independent with basic ADLs, Independent with household mobility with device, and Independent with community mobility without device  PATIENT GOALS: To increase LE strength in order to be able to walk with SPC  OBJECTIVE:  Note: Objective measures were completed at Evaluation unless otherwise noted.  DIAGNOSTIC FINDINGS: N/A  COGNITION: Overall cognitive status:  Within functional limits for tasks assessed   SENSATION: Light touch: WFL (upper and lower dermatomes WNL)  COORDINATION: Finger to nose: impaired  Heel to shin: WFL (LLE limited by strength)  Rapid alternating movements: Alternating heel taps: WFL Alternating hand turns: WFL (limited by LUE strength)  MUSCLE LENGTH: Knee flexion contractures noted bilaterally   POSTURE: rounded shoulders, forward head, increased thoracic kyphosis, and weight shift right  UPPER EXTREMITY ROM:     Active  Right Eval Left Eval  Shoulder abduction 114 deg 78 deg   LOWER EXTREMITY MMT:    MMT Right Eval Left Eval  Hip flexion 4- 3  Hip extension    Hip abduction 4- 3  Hip adduction 4- 4-  Hip internal rotation    Hip external rotation    Knee flexion 3+ 3+  Knee extension 4 3  Ankle dorsiflexion 4- 3+  Ankle plantarflexion    Ankle inversion    Ankle eversion    (Blank rows = not tested)  UPPER EXTREMITY MMT: MMT Right eval Left eval  Shoulder flexion 4 3  Shoulder extension    Shoulder abduction 4- 3  Shoulder adduction    Shoulder extension    Shoulder internal rotation 4 3  Shoulder external rotation 4 3  Middle trapezius    Lower trapezius    Elbow flexion 4 3+  Elbow extension 4- 3  Wrist flexion    Wrist extension    Wrist ulnar deviation    Wrist radial deviation    Wrist pronation 4+ 4-  Wrist supination 4+ 4-  Grip strength 38.5# 47.2#   (Blank rows = not tested)   BED MOBILITY:  Findings: Sit to supine Complete Independence Supine to sit CGA  TRANSFERS: Sit to stand: Mod A  Assistive device utilized: Wheelchair (manual)     Stand to sit: CGA  Assistive device utilized: Grab bars      GAIT: Findings: Gait Characteristics: step to pattern, decreased step length- Left, decreased stance time- Left, decreased stride length, decreased hip/knee flexion- Left, decreased ankle dorsiflexion- Left, trunk flexed, narrow BOS, and poor foot clearance- Left,  Distance walked: 40 ft in // bars, Assistive device utilized:// bars, Level of assistance: Min A, and  Comments: Required verbal and tactile cueing keep trunk upright and prevent LLE from adducting past midline  FUNCTIONAL TESTS:  Will assess next session  PATIENT SURVEYS:  Will assess next session  05/09/24 Left Knee extension: -2 deg actively, 0 deg passively Right Knee extension: -9 deg actively, -1 deg passively Left hip flexion AROM: 117 deg Right hip flexion AROM: 113 deg Left shoulder flexion AROM: 130 deg Right shoulder flexion AROM: WNL Left shoulder abduction AROM: 117 deg Right shoulder abduction AROM: WNL  06/03/2024 MMT Right Left  Shoulder flexion 4 4-  Shoulder extension    Shoulder abduction 4- 4-  Shoulder adduction    Shoulder extension    Shoulder internal rotation 4 3+  Shoulder external rotation 4 4-   Active shoulder flexion of left shoulder measured at 135 degrees                                                                                                                    TREATMENT DATE: 06/14/2024  Subjective: Pt arrived to PT in w/c with no new complaints.  No c/o pain.  Pt reports that he he has been working on stand pivot transfers at home with his caregiver and he believes they are improving.    Therapeutic Activity:  Forward and Lateral walking in // bars, 2 passes down and back each, CGA from therapist for maintaining balance, safe turns, and tactile cues. Pt required SBA for controlled descent during stand to sit transfers in // bars   Forward ambulation with RW outside of //-bars in gym/ hallway for 48 feet. No LOB. Stand to sit transfer required CGA from therapist for controlled descent to chair   Sit to stands from elevated blue mat table and AirexPad with focus on pushing off through triceps and proper foot positioning to assist with transfer, (wedge placed under heels to further encourage required forward weight shift/momentum) ~20 total  attempts. Patient was able to stand upright momentarily and with RW requiring min A for concentric lift off when pushing off blue mat table.  W/c to car (passenger side) transfer with mod A.  Pt. Prefers to pull up on car door with window open.  Slight improvement with standing tolerance/ knee extension requiring assist from PT.  Pt. Able to get B LE in car once sitting on passenger chair.   Not performed today:  Standing in // bars without bilateral UE support and functional reaching for anterior cones outside of BOS, 1 x 10 total cones, CGA from therapist Nustep L4 B UE/LE (cuing to maintain L LE in midline)- 10 min.  0.24 miles.  Moderate cuing to correct L LE midline position.  Seated Tricep extensions and Lat Pulldowns on Nautilus, 30#, 3 x 12   PATIENT EDUCATION: Education details: Pt educated on diagnosis, prognosis, HEP, and POC Person educated: Patient Education method: Explanation, Demonstration, Tactile cues, and Verbal cues Education comprehension: verbalized understanding and returned demonstration  HOME EXERCISE PROGRAM: Access Code: [email or web address] URL: [email or web address] Date: 05/13/2024 Prepared by:  Ozell Sero Exercises - Seated March - 2 x daily - 7 x weekly - 2 sets - 15 reps - Seated Long Arc Quad - 2 x daily - 7 x weekly - 2 sets - 15 reps - Seated Heel Raise - 2 x daily - 7 x weekly - 2 sets - 15 reps - Seated Elbow Extension with Self-Anchored Resistance - 2 x daily - 7 x weekly - 2 sets - 15 reps - Sit to Stand with Armchair - 1 x daily - 7 x weekly - 3 sets - 10 reps   GOALS: Goals reviewed with patient? Yes  SHORT TERM GOALS: Target date: 06/02/2024  Pt to improve gross LUE strength to at least a 3+/5 in order to achieve greater independence with functional transfers.  Baseline: see above Goal status: GOAL MET  2.  Pt to improve left shoulder elevation AROM to at least 105 degrees to become symmetrical with RUE and have greater ease with reaching  objects at home.  Baseline: left shoulder AROM: 78 deg; left shoulder AROM: 135 deg;  Goal status: GOAL MET  LONG TERM GOALS: Target date: 06/30/2024  Pt to be able to ambulate household distances (at least 300 ft) with use of RW and no more than CGA from therapist so that he can become independent with daily ADLs.  Baseline: nonambulatory with use of manual WC Goal status: INITIAL  2.  Pt to improve gross LLE strength to at least a 3+/5 in order to assist with forward ambulation in // bars or with RW so pt can become more independent with daily ADLs.  Baseline: see above Goal status: INITIAL  3.  Pt will require no greater than CGA with sit to stand transfers so that he is able to safely sit to stand at home with less assistance from his caretaker.  Baseline: mod A Goal status: INITIAL  ASSESSMENT:  CLINICAL IMPRESSION: Pt presents to physical therapy without any concerns and remains heavily motivated to participate in physical therapy sessions. Focus of today's tx session was improving sit to stand technique. Pt ambulated with RW outside // bars with CGA from therapist for ~ 48 feet on this day before requesting to sit secondary to general, systemic fatigue. Pt did require CGA during ambulation for safe turns with RW. Pt performed sit to stand transfers from elevated blue mat table and blue Airexpad with focus on pushing off triceps to assist with transfer with a wedge placed under bilateral heels to further encourage forward anterior weight shift/momentum. Pt required frequent verbal and tactile cueing to adjust foot positioning so that bilateral knees began in a more flexed position to assist with pushoff which he was able to correct. Patient was able to stand upright with use of RW requiring min A for concentric lift off when pushing off blue mat table and CGA for concentric lift to block LLE and RW when utilizing RW. Pt. will continue to benefit from skilled PT services to increase LE  strength, transfers/gait with appropriate assistive device.    OBJECTIVE IMPAIRMENTS: Abnormal gait, decreased activity tolerance, decreased balance, decreased coordination, decreased endurance, decreased mobility, difficulty walking, decreased ROM, decreased strength, decreased safety awareness, impaired flexibility, and impaired UE functional use.   ACTIVITY LIMITATIONS: carrying, lifting, bending, standing, squatting, stairs, transfers, bathing, toileting, dressing, self feeding, reach over head, hygiene/grooming, and locomotion level  PARTICIPATION LIMITATIONS: meal prep, cleaning, laundry, driving, shopping, community activity, and yard work  PERSONAL FACTORS: Age, Fitness, Transportation, and 3+ comorbidities: T2DM,  A-fib, HTN, Stage III CKD are also affecting patient's functional outcome.   REHAB POTENTIAL: Good  CLINICAL DECISION MAKING: Evolving/moderate complexity  EVALUATION COMPLEXITY: High  PLAN:  PT FREQUENCY: 2x/week  PT DURATION: 8 weeks  PLANNED INTERVENTIONS: 97164- PT Re-evaluation, 97110-Therapeutic exercises, 97530- Therapeutic activity, 97112- Neuromuscular re-education, 97535- Self Care, [id number]- Manual therapy, Patient/Family education, DME instructions, and Wheelchair mobility training  PLAN FOR NEXT SESSION: Continue transfer training with focus on controlled descent and UE pushoff and weight shifting anteriorly. Improve general endurance capacity/tolerance to activity.     Curtistine Bracket, SPT  Selinda BIRCH Huprich PT, DPT, GCS  06/14/2024, 2:08 PM

## 2024-06-16 ENCOUNTER — Ambulatory Visit: Admitting: Physical Therapy

## 2024-06-16 ENCOUNTER — Telehealth: Payer: Self-pay | Admitting: Internal Medicine

## 2024-06-16 NOTE — Telephone Encounter (Signed)
 Copied from CRM #[id number]. Topic: Medicare AWV >> Jun 16, 2024  9:59 AM Nathanel DEL wrote: Reason for CRM: Called LVM 06/15/2024 to schedule AWV. Please schedule office or virtual visits  Nathanel Paschal; Care Guide Ambulatory Clinical Support [place] [facility] Direct Dial: [phone]

## 2024-06-17 ENCOUNTER — Other Ambulatory Visit: Payer: Self-pay

## 2024-06-22 ENCOUNTER — Ambulatory Visit: Admitting: Physical Therapy

## 2024-06-22 ENCOUNTER — Encounter: Payer: Self-pay | Admitting: Physical Therapy

## 2024-06-22 DIAGNOSIS — R6889 Other general symptoms and signs: Secondary | ICD-10-CM

## 2024-06-22 DIAGNOSIS — I63531 Cerebral infarction due to unspecified occlusion or stenosis of right posterior cerebral artery: Secondary | ICD-10-CM

## 2024-06-22 DIAGNOSIS — M6281 Muscle weakness (generalized): Secondary | ICD-10-CM

## 2024-06-22 DIAGNOSIS — R2689 Other abnormalities of gait and mobility: Secondary | ICD-10-CM

## 2024-06-22 DIAGNOSIS — Z7409 Other reduced mobility: Secondary | ICD-10-CM

## 2024-06-22 NOTE — Therapy (Signed)
 OUTPATIENT PHYSICAL THERAPY NEURO TREATMENT  Patient Name: Corey Howard MRN: [id number] DOB:06-01-1951, 73 y.o., male Today's Date: 06/22/2024  REFERRING PROVIDER: Justus Leita DEL, MD  END OF SESSION:  PT End of Session - 06/22/24 1031     Visit Number 13    Number of Visits 16    Date for Recertification  06/30/24    PT Start Time 1032    PT Stop Time 1116    PT Time Calculation (min) 44 min    Equipment Utilized During Treatment Gait belt    Activity Tolerance Patient tolerated treatment well;Patient limited by fatigue;No increased pain    Behavior During Therapy WFL for tasks assessed/performed         Past Medical History:  Diagnosis Date   Diabetes mellitus without complication (HCC)    DVT (deep venous thrombosis) (HCC)    History of DVT (deep vein thrombosis)    Hyperlipidemia    Past Surgical History:  Procedure Laterality Date   TONSILLECTOMY     Patient Active Problem List   Diagnosis Date Noted   Ambulatory dysfunction 01/27/2024   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) 01/27/2024   Paroxysmal atrial fibrillation (HCC) 01/27/2024   Carotid artery stenosis 01/27/2024   Aortic atherosclerosis 01/25/2024   Facial weakness following cerebrovascular accident (CVA) 11/02/2023   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 11/02/2023   Oropharyngeal dysphagia 11/02/2023   Long term current use of anticoagulant therapy 11/02/2023   History of ischemic stroke 10/25/2023   Acquired thrombophilia 06/08/2023   Antiphospholipid antibody syndrome 03/04/2023   Neuropathy 03/04/2023   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   History of Bell's palsy    Tobacco abuse 01/27/2017   Seborrhea 10/01/2015   Type II diabetes mellitus with complication (HCC) 03/16/2015   Essential hypertension 03/16/2015   ONSET DATE: March 13th, 2025 (date of CVA)  REFERRING DIAG: Z51.73 (ICD-10-CM) - History of ischemic  stroke   THERAPY DIAG:  Muscle weakness (generalized)  Other abnormalities of gait and mobility  Decreased independence with transfers  Unable to perform basic transfers independently  Cerebrovascular accident (CVA) due to occlusion of right posterior cerebral artery (HCC)  Rationale for Evaluation and Treatment: Rehabilitation  SUBJECTIVE:                                                                                                                                                                                             SUBJECTIVE STATEMENT: Pt is a 73 year old male presenting to physical therapy with mobility concerns s/p ischemic (multifocal, R occipital) stroke on March  13th, 2025. Pt reports left sided strength deficits affecting his mobility. Pt currently using a manual WC to get around and relies on his friend/roommate/tenant and a caretaker (who comes in the morning and at night) for assistance with transfers, feeding, dressing, and standing. Pt would like to increase his strength so that he can return to walking, which he states that he was able to do with use of SPC prior to stroke.   PERTINENT HISTORY: Pt had physical therapy for one month prior but came to clinic because of access to // bars with goals of walking. Pt has previous history of DVT, stage III CKD, type II DM, and HTN.   PAIN:  Are you having pain? No  PRECAUTIONS: None  RED FLAGS: None   WEIGHT BEARING RESTRICTIONS: No  FALLS: Has patient fallen in last 6 months? No  LIVING ENVIRONMENT: Lives with: his friend/tenant  Lives in: House/apartment Stairs: Yes: External: 2 steps; can reach both Has following equipment at home: Single point cane, Walker - 2 wheeled, Wheelchair (manual), and bed side commode  PLOF: Independent with basic ADLs, Independent with household mobility with device, and Independent with community mobility without device  PATIENT GOALS: To increase LE strength in order to be able  to walk with SPC  OBJECTIVE:  Note: Objective measures were completed at Evaluation unless otherwise noted.  DIAGNOSTIC FINDINGS: N/A  COGNITION: Overall cognitive status: Within functional limits for tasks assessed   SENSATION: Light touch: WFL (upper and lower dermatomes WNL)  COORDINATION: Finger to nose: impaired  Heel to shin: WFL (LLE limited by strength)  Rapid alternating movements: Alternating heel taps: WFL Alternating hand turns: WFL (limited by LUE strength)  MUSCLE LENGTH: Knee flexion contractures noted bilaterally   POSTURE: rounded shoulders, forward head, increased thoracic kyphosis, and weight shift right  UPPER EXTREMITY ROM:     Active  Right Eval Left Eval  Shoulder abduction 114 deg 78 deg   LOWER EXTREMITY MMT:    MMT Right Eval Left Eval  Hip flexion 4- 3  Hip extension    Hip abduction 4- 3  Hip adduction 4- 4-  Hip internal rotation    Hip external rotation    Knee flexion 3+ 3+  Knee extension 4 3  Ankle dorsiflexion 4- 3+  Ankle plantarflexion    Ankle inversion    Ankle eversion    (Blank rows = not tested)  UPPER EXTREMITY MMT: MMT Right eval Left eval  Shoulder flexion 4 3  Shoulder extension    Shoulder abduction 4- 3  Shoulder adduction    Shoulder extension    Shoulder internal rotation 4 3  Shoulder external rotation 4 3  Middle trapezius    Lower trapezius    Elbow flexion 4 3+  Elbow extension 4- 3  Wrist flexion    Wrist extension    Wrist ulnar deviation    Wrist radial deviation    Wrist pronation 4+ 4-  Wrist supination 4+ 4-  Grip strength 38.5# 47.2#   (Blank rows = not tested)   BED MOBILITY:  Findings: Sit to supine Complete Independence Supine to sit CGA  TRANSFERS: Sit to stand: Mod A  Assistive device utilized: Wheelchair (manual)     Stand to sit: CGA  Assistive device utilized: Grab bars      GAIT: Findings: Gait Characteristics: step to pattern, decreased step length- Left,  decreased stance time- Left, decreased stride length, decreased hip/knee flexion- Left, decreased ankle dorsiflexion- Left, trunk flexed, narrow  BOS, and poor foot clearance- Left, Distance walked: 40 ft in // bars, Assistive device utilized:// bars, Level of assistance: Min A, and Comments: Required verbal and tactile cueing keep trunk upright and prevent LLE from adducting past midline  FUNCTIONAL TESTS:  Will assess next session  PATIENT SURVEYS:  Will assess next session  05/09/24 Left Knee extension: -2 deg actively, 0 deg passively Right Knee extension: -9 deg actively, -1 deg passively Left hip flexion AROM: 117 deg Right hip flexion AROM: 113 deg Left shoulder flexion AROM: 130 deg Right shoulder flexion AROM: WNL Left shoulder abduction AROM: 117 deg Right shoulder abduction AROM: WNL  06/03/2024 MMT Right Left  Shoulder flexion 4 4-  Shoulder extension    Shoulder abduction 4- 4-  Shoulder adduction    Shoulder extension    Shoulder internal rotation 4 3+  Shoulder external rotation 4 4-   Active shoulder flexion of left shoulder measured at 135 degrees                                                                                                                    TREATMENT DATE: 06/22/2024  Subjective: Pt arrived to PT in w/c with no new complaints.  No c/o pain.  Pt reports he is somewhat tired on this day.   Therapeutic Activity:  Forward walking in // bars, 2 passes down and back each, CGA from therapist for maintaining balance, safe turns, and tactile cues. Pt required SBA for controlled descent during stand to sit transfers.   Forward ambulation with RW outside of //-bars in gym/ hallway for 47 feet. No LOB. Stand to sit transfer required CGA from therapist for controlled descent to chair   Sit to stands from elevated blue mat table and AirexPad with focus on pushing off through triceps and proper foot positioning to assist with transfer, (wedge placed under  heels to further encourage required forward weight shift/momentum) ~10 total attempts. Patient was able to stand upright momentarily and with RW requiring min and mod A for concentric lift off when pushing off blue mat table.  Seated Tricep extensions and Lat Pulldowns on Nautilus, 30#, 3 x 12   W/c to car (passenger side) transfer with mod A.  Pt continues to pull up on car door with window open.  Slight improvement with standing tolerance/ knee extension requiring less assist from PT.  Pt. Able to get B LE in car once sitting on passenger chair.   Not performed today:  Standing in // bars without bilateral UE support and functional reaching for anterior cones outside of BOS, 1 x 10 total cones, CGA from therapist Nustep L4 B UE/LE (cuing to maintain L LE in midline)- 10 min.  0.24 miles.  Moderate cuing to correct L LE midline position.    PATIENT EDUCATION: Education details: Pt educated on diagnosis, prognosis, HEP, and POC Person educated: Patient Education method: Explanation, Demonstration, Tactile cues, and Verbal cues Education comprehension: verbalized understanding and returned demonstration  HOME EXERCISE PROGRAM: Access  Code: [email or web address] URL: [email or web address] Date: 05/13/2024 Prepared by: Ozell Sero Exercises - Seated March - 2 x daily - 7 x weekly - 2 sets - 15 reps - Seated Long Arc Quad - 2 x daily - 7 x weekly - 2 sets - 15 reps - Seated Heel Raise - 2 x daily - 7 x weekly - 2 sets - 15 reps - Seated Elbow Extension with Self-Anchored Resistance - 2 x daily - 7 x weekly - 2 sets - 15 reps - Sit to Stand with Armchair - 1 x daily - 7 x weekly - 3 sets - 10 reps   GOALS: Goals reviewed with patient? Yes  SHORT TERM GOALS: Target date: 06/02/2024  Pt to improve gross LUE strength to at least a 3+/5 in order to achieve greater independence with functional transfers.  Baseline: see above Goal status: GOAL MET  2.  Pt to improve left shoulder elevation AROM  to at least 105 degrees to become symmetrical with RUE and have greater ease with reaching objects at home.  Baseline: left shoulder AROM: 78 deg; left shoulder AROM: 135 deg;  Goal status: GOAL MET  LONG TERM GOALS: Target date: 06/30/2024  Pt to be able to ambulate household distances (at least 300 ft) with use of RW and no more than CGA from therapist so that he can become independent with daily ADLs.  Baseline: nonambulatory with use of manual WC Goal status: INITIAL  2.  Pt to improve gross LLE strength to at least a 3+/5 in order to assist with forward ambulation in // bars or with RW so pt can become more independent with daily ADLs.  Baseline: see above Goal status: INITIAL  3.  Pt will require no greater than CGA with sit to stand transfers so that he is able to safely sit to stand at home with less assistance from his caretaker.  Baseline: mod A Goal status: INITIAL  ASSESSMENT:  CLINICAL IMPRESSION: Pt presents to physical therapy without any concerns and remains heavily motivated to participate in physical therapy sessions. Focus of today's tx session was improving sit to stand technique. Pt ambulated with RW outside // bars with CGA from therapist for ~ 47 feet on this day before requesting to sit secondary to general, systemic fatigue. Pt required CGA during ambulation for safe turns with RW. Pt performed sit to stand transfers from elevated blue mat table and blue Airexpad with focus on pushing off triceps to assist with transfer. Pt required frequent verbal and tactile cueing to adjust foot positioning so that bilateral knees began in a more flexed position and further apart from one another to assist with push off from widened BOS which he was able to correct. Patient with tendency to reach for RW during sit to stand transfers despite verbal cueing to push off blue mat table. Pt with noticeable improvement in ability to complete seated Nautilus tricep pushdowns on this day with  less verbal and tactile cueing required in order to complete with proper form. Pt. will continue to benefit from skilled PT services to increase LE strength, transfers/gait with appropriate assistive device.    OBJECTIVE IMPAIRMENTS: Abnormal gait, decreased activity tolerance, decreased balance, decreased coordination, decreased endurance, decreased mobility, difficulty walking, decreased ROM, decreased strength, decreased safety awareness, impaired flexibility, and impaired UE functional use.   ACTIVITY LIMITATIONS: carrying, lifting, bending, standing, squatting, stairs, transfers, bathing, toileting, dressing, self feeding, reach over head, hygiene/grooming, and locomotion level  PARTICIPATION LIMITATIONS: meal prep, cleaning, laundry,  driving, shopping, community activity, and yard work  PERSONAL FACTORS: Age, Fitness, Transportation, and 3+ comorbidities: T2DM, A-fib, HTN, Stage III CKD are also affecting patient's functional outcome.   REHAB POTENTIAL: Good  CLINICAL DECISION MAKING: Evolving/moderate complexity  EVALUATION COMPLEXITY: High  PLAN:  PT FREQUENCY: 2x/week  PT DURATION: 8 weeks  PLANNED INTERVENTIONS: 97164- PT Re-evaluation, 97110-Therapeutic exercises, 97530- Therapeutic activity, 97112- Neuromuscular re-education, 97535- Self Care, [id number]- Manual therapy, Patient/Family education, DME instructions, and Wheelchair mobility training  PLAN FOR NEXT SESSION: Continue transfer training with focus on controlled descent and UE pushoff and weight shifting anteriorly. Improve general endurance capacity/tolerance to activity.     Ozell JAYSON Sero, PT, DPT # [id number] Curtistine Bracket, SPT  06/22/2024, 10:31 AM

## 2024-06-24 ENCOUNTER — Ambulatory Visit: Admitting: Physical Therapy

## 2024-06-24 DIAGNOSIS — R2689 Other abnormalities of gait and mobility: Secondary | ICD-10-CM

## 2024-06-24 DIAGNOSIS — R6889 Other general symptoms and signs: Secondary | ICD-10-CM

## 2024-06-24 DIAGNOSIS — M6281 Muscle weakness (generalized): Secondary | ICD-10-CM

## 2024-06-24 DIAGNOSIS — Z7409 Other reduced mobility: Secondary | ICD-10-CM

## 2024-06-24 DIAGNOSIS — I63531 Cerebral infarction due to unspecified occlusion or stenosis of right posterior cerebral artery: Secondary | ICD-10-CM

## 2024-06-24 NOTE — Therapy (Signed)
 OUTPATIENT PHYSICAL THERAPY NEURO TREATMENT  Patient Name: Corey Howard MRN: [id number] DOB:[birth year], 73 y.o., male Today's Date: 06/24/2024  REFERRING PROVIDER: Justus Leita DEL, MD  END OF SESSION:  PT End of Session - 06/24/24 1108     Visit Number 14    Number of Visits 16    Date for Recertification  06/30/24    PT Start Time 1030    PT Stop Time 1115    PT Time Calculation (min) 45 min    Equipment Utilized During Treatment Gait belt    Activity Tolerance Patient tolerated treatment well;Patient limited by fatigue;No increased pain    Behavior During Therapy WFL for tasks assessed/performed          Past Medical History:  Diagnosis Date   Diabetes mellitus without complication (HCC)    DVT (deep venous thrombosis) (HCC)    History of DVT (deep vein thrombosis)    Hyperlipidemia    Past Surgical History:  Procedure Laterality Date   TONSILLECTOMY     Patient Active Problem List   Diagnosis Date Noted   Ambulatory dysfunction 01/27/2024   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) 01/27/2024   Paroxysmal atrial fibrillation (HCC) 01/27/2024   Carotid artery stenosis 01/27/2024   Aortic atherosclerosis 01/25/2024   Facial weakness following cerebrovascular accident (CVA) 11/02/2023   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 11/02/2023   Oropharyngeal dysphagia 11/02/2023   Long term current use of anticoagulant therapy 11/02/2023   History of ischemic stroke 10/25/2023   Acquired thrombophilia 06/08/2023   Antiphospholipid antibody syndrome 03/04/2023   Neuropathy 03/04/2023   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   History of Bell's palsy    Tobacco abuse 01/27/2017   Seborrhea 10/01/2015   Type II diabetes mellitus with complication (HCC) 03/16/2015   Essential hypertension 03/16/2015   ONSET DATE: March 13th, 2025 (date of CVA)  REFERRING DIAG: Z66.73 (ICD-10-CM) - History of ischemic  stroke   THERAPY DIAG:  Muscle weakness (generalized)  Other abnormalities of gait and mobility  Decreased independence with transfers  Unable to perform basic transfers independently  Cerebrovascular accident (CVA) due to occlusion of right posterior cerebral artery (HCC)  Rationale for Evaluation and Treatment: Rehabilitation  SUBJECTIVE:                                                                                                                                                                                             SUBJECTIVE STATEMENT: Pt is a 73 year old male presenting to physical therapy with mobility concerns s/p ischemic (multifocal, R occipital) stroke on  March 13th, 2025. Pt reports left sided strength deficits affecting his mobility. Pt currently using a manual WC to get around and relies on his friend/roommate/tenant and a caretaker (who comes in the morning and at night) for assistance with transfers, feeding, dressing, and standing. Pt would like to increase his strength so that he can return to walking, which he states that he was able to do with use of SPC prior to stroke.   PERTINENT HISTORY: Pt had physical therapy for one month prior but came to clinic because of access to // bars with goals of walking. Pt has previous history of DVT, stage III CKD, type II DM, and HTN.   PAIN:  Are you having pain? No  PRECAUTIONS: None  RED FLAGS: None   WEIGHT BEARING RESTRICTIONS: No  FALLS: Has patient fallen in last 6 months? No  LIVING ENVIRONMENT: Lives with: his friend/tenant  Lives in: House/apartment Stairs: Yes: External: 2 steps; can reach both Has following equipment at home: Single point cane, Walker - 2 wheeled, Wheelchair (manual), and bed side commode  PLOF: Independent with basic ADLs, Independent with household mobility with device, and Independent with community mobility without device  PATIENT GOALS: To increase LE strength in order to be able  to walk with SPC  OBJECTIVE:  Note: Objective measures were completed at Evaluation unless otherwise noted.  DIAGNOSTIC FINDINGS: N/A  COGNITION: Overall cognitive status: Within functional limits for tasks assessed   SENSATION: Light touch: WFL (upper and lower dermatomes WNL)  COORDINATION: Finger to nose: impaired  Heel to shin: WFL (LLE limited by strength)  Rapid alternating movements: Alternating heel taps: WFL Alternating hand turns: WFL (limited by LUE strength)  MUSCLE LENGTH: Knee flexion contractures noted bilaterally   POSTURE: rounded shoulders, forward head, increased thoracic kyphosis, and weight shift right  UPPER EXTREMITY ROM:     Active  Right Eval Left Eval  Shoulder abduction 114 deg 78 deg   LOWER EXTREMITY MMT:    MMT Right Eval Left Eval  Hip flexion 4- 3  Hip extension    Hip abduction 4- 3  Hip adduction 4- 4-  Hip internal rotation    Hip external rotation    Knee flexion 3+ 3+  Knee extension 4 3  Ankle dorsiflexion 4- 3+  Ankle plantarflexion    Ankle inversion    Ankle eversion    (Blank rows = not tested)  UPPER EXTREMITY MMT: MMT Right eval Left eval  Shoulder flexion 4 3  Shoulder extension    Shoulder abduction 4- 3  Shoulder adduction    Shoulder extension    Shoulder internal rotation 4 3  Shoulder external rotation 4 3  Middle trapezius    Lower trapezius    Elbow flexion 4 3+  Elbow extension 4- 3  Wrist flexion    Wrist extension    Wrist ulnar deviation    Wrist radial deviation    Wrist pronation 4+ 4-  Wrist supination 4+ 4-  Grip strength 38.5# 47.2#   (Blank rows = not tested)   BED MOBILITY:  Findings: Sit to supine Complete Independence Supine to sit CGA  TRANSFERS: Sit to stand: Mod A  Assistive device utilized: Wheelchair (manual)     Stand to sit: CGA  Assistive device utilized: Grab bars      GAIT: Findings: Gait Characteristics: step to pattern, decreased step length- Left,  decreased stance time- Left, decreased stride length, decreased hip/knee flexion- Left, decreased ankle dorsiflexion- Left, trunk flexed,  narrow BOS, and poor foot clearance- Left, Distance walked: 40 ft in // bars, Assistive device utilized:// bars, Level of assistance: Min A, and Comments: Required verbal and tactile cueing keep trunk upright and prevent LLE from adducting past midline  FUNCTIONAL TESTS:  Will assess next session  PATIENT SURVEYS:  Will assess next session  05/09/24 Left Knee extension: -2 deg actively, 0 deg passively Right Knee extension: -9 deg actively, -1 deg passively Left hip flexion AROM: 117 deg Right hip flexion AROM: 113 deg Left shoulder flexion AROM: 130 deg Right shoulder flexion AROM: WNL Left shoulder abduction AROM: 117 deg Right shoulder abduction AROM: WNL  06/03/2024 MMT Right Left  Shoulder flexion 4 4-  Shoulder extension    Shoulder abduction 4- 4-  Shoulder adduction    Shoulder extension    Shoulder internal rotation 4 3+  Shoulder external rotation 4 4-   Active shoulder flexion of left shoulder measured at 135 degrees                                                                                                                    TREATMENT DATE: 06/24/2024  Subjective: Pt arrived to PT in w/c with no new complaints.  No c/o pain.  Pt reports he has been practicing walking at home with use of RW and assist from his caregiver on occasion.   Therapeutic Activity:  Nustep L4 B UE/LE (cuing to maintain L LE in midline)- 10 min  Moderate cuing to correct L LE midline position    Forward walking in // bars, 2 passes down and back each, CGA from therapist for maintaining balance, safe turns, and tactile cues. Pt required SBA for controlled descent during stand to sit transfers.   Forward ambulation with RW outside of //-bars in gym/ hallway for 48 feet. No LOB. Stand to sit transfer required SBA from therapist for controlled descent to  chair   Standing in // bars without UE support and raising BUE overhead, 5 x to failure (best trials ~6 seconds)  Seated Tricep extensions, Lat retractions, Lat Pulldowns on Nautilus, 30#, 3 x 12 each  W/c to car (passenger side) transfer with min A.  Pt continues to pull up on car door with window open.  Slight improvement with standing tolerance/ knee extension requiring less assist from PT.  Pt able to get B LE in car once sitting on passenger chair.   Not performed today:  Standing in // bars without bilateral UE support and functional reaching for anterior cones outside of BOS, 1 x 10 total cones, CGA from therapist Sit to stands from elevated blue mat table and [employer] with focus on pushing off through triceps and proper foot positioning to assist with transfer, (wedge placed under heels to further encourage required forward weight shift/momentum) ~10 total attempts. Patient was able to stand upright momentarily and with RW requiring min and mod A for concentric lift off when pushing off blue mat table.   PATIENT EDUCATION: Education details: Pt  educated on diagnosis, prognosis, HEP, and POC Person educated: Patient Education method: Explanation, Demonstration, Tactile cues, and Verbal cues Education comprehension: verbalized understanding and returned demonstration  HOME EXERCISE PROGRAM: Access Code: [email or web address] URL: [email or web address] Date: 05/13/2024 Prepared by: Ozell Sero Exercises - Seated March - 2 x daily - 7 x weekly - 2 sets - 15 reps - Seated Long Arc Quad - 2 x daily - 7 x weekly - 2 sets - 15 reps - Seated Heel Raise - 2 x daily - 7 x weekly - 2 sets - 15 reps - Seated Elbow Extension with Self-Anchored Resistance - 2 x daily - 7 x weekly - 2 sets - 15 reps - Sit to Stand with Armchair - 1 x daily - 7 x weekly - 3 sets - 10 reps   GOALS: Goals reviewed with patient? Yes  SHORT TERM GOALS: Target date: 06/02/2024  Pt to improve gross LUE strength to at  least a 3+/5 in order to achieve greater independence with functional transfers.  Baseline: see above Goal status: GOAL MET  2.  Pt to improve left shoulder elevation AROM to at least 105 degrees to become symmetrical with RUE and have greater ease with reaching objects at home.  Baseline: left shoulder AROM: 78 deg; left shoulder AROM: 135 deg;  Goal status: GOAL MET  LONG TERM GOALS: Target date: 06/30/2024  Pt to be able to ambulate household distances (at least 300 ft) with use of RW and no more than CGA from therapist so that he can become independent with daily ADLs.  Baseline: nonambulatory with use of manual WC Goal status: INITIAL  2.  Pt to improve gross LLE strength to at least a 3+/5 in order to assist with forward ambulation in // bars or with RW so pt can become more independent with daily ADLs.  Baseline: see above Goal status: INITIAL  3.  Pt will require no greater than CGA with sit to stand transfers so that he is able to safely sit to stand at home with less assistance from his caretaker.  Baseline: mod A Goal status: INITIAL  ASSESSMENT:  CLINICAL IMPRESSION: Pt presents to physical therapy without any concerns and remains heavily motivated to participate in physical therapy sessions. Pt ambulated with RW outside // bars with CGA from therapist for ~ 48 feet on this day before requesting to sit secondary to general, systemic fatigue. Pt introduced to seated Nautilus retractions on this day which he tolerated well and pt continues to show improvement with seated Nautilus activities. Pt able to maintain static stance with BUE raised overhead for roughly 6 seconds at best before requiring use of BUE on // bars to prevent LOB posteriorly. Pt. will continue to benefit from skilled PT services to increase LE strength, transfers/gait with appropriate assistive device.    OBJECTIVE IMPAIRMENTS: Abnormal gait, decreased activity tolerance, decreased balance, decreased  coordination, decreased endurance, decreased mobility, difficulty walking, decreased ROM, decreased strength, decreased safety awareness, impaired flexibility, and impaired UE functional use.   ACTIVITY LIMITATIONS: carrying, lifting, bending, standing, squatting, stairs, transfers, bathing, toileting, dressing, self feeding, reach over head, hygiene/grooming, and locomotion level  PARTICIPATION LIMITATIONS: meal prep, cleaning, laundry, driving, shopping, community activity, and yard work  PERSONAL FACTORS: Age, Fitness, Transportation, and 3+ comorbidities: T2DM, A-fib, HTN, Stage III CKD are also affecting patient's functional outcome.   REHAB POTENTIAL: Good  CLINICAL DECISION MAKING: Evolving/moderate complexity  EVALUATION COMPLEXITY: High  PLAN:  PT FREQUENCY: 2x/week  PT DURATION: 8 weeks  PLANNED INTERVENTIONS: 97164- PT Re-evaluation, 97110-Therapeutic exercises, 97530- Therapeutic activity, [id number]- Neuromuscular re-education, [phone]- Self Care, [id number]- Manual therapy, Patient/Family education, DME instructions, and Wheelchair mobility training  PLAN FOR NEXT SESSION: Continue transfer training with focus on controlled descent and UE pushoff and weight shifting anteriorly. Improve general endurance capacity/tolerance to activity.   UPDATE SCHEDULE AND DISCUSS POC  Ozell JAYSON Sero, PT, DPT # [id number] Curtistine Bracket, SPT  06/24/2024, 11:09 AM

## 2024-06-28 ENCOUNTER — Ambulatory Visit: Admitting: Physical Therapy

## 2024-06-28 ENCOUNTER — Other Ambulatory Visit: Payer: Self-pay

## 2024-06-28 DIAGNOSIS — M6281 Muscle weakness (generalized): Secondary | ICD-10-CM | POA: Diagnosis not present

## 2024-06-28 DIAGNOSIS — Z7409 Other reduced mobility: Secondary | ICD-10-CM

## 2024-06-28 DIAGNOSIS — R2689 Other abnormalities of gait and mobility: Secondary | ICD-10-CM

## 2024-06-28 DIAGNOSIS — I63531 Cerebral infarction due to unspecified occlusion or stenosis of right posterior cerebral artery: Secondary | ICD-10-CM

## 2024-06-28 DIAGNOSIS — R6889 Other general symptoms and signs: Secondary | ICD-10-CM

## 2024-06-28 NOTE — Therapy (Unsigned)
 OUTPATIENT PHYSICAL THERAPY NEURO TREATMENT  Patient Name: Corey Howard MRN: [id number] DOB:June 02, 1951, 73 y.o., male Today's Date: 06/28/2024  REFERRING PROVIDER: Justus Leita DEL, MD  END OF SESSION:  PT End of Session - 06/28/24 0948     Visit Number 15    Number of Visits 16    Date for Recertification  06/30/24    PT Start Time 0945    PT Stop Time 1030    PT Time Calculation (min) 45 min    Equipment Utilized During Treatment Gait belt    Activity Tolerance Patient tolerated treatment well;Patient limited by fatigue;No increased pain    Behavior During Therapy WFL for tasks assessed/performed           Past Medical History:  Diagnosis Date   Diabetes mellitus without complication (HCC)    DVT (deep venous thrombosis) (HCC)    History of DVT (deep vein thrombosis)    Hyperlipidemia    Past Surgical History:  Procedure Laterality Date   TONSILLECTOMY     Patient Active Problem List   Diagnosis Date Noted   Ambulatory dysfunction 01/27/2024   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) 01/27/2024   Paroxysmal atrial fibrillation (HCC) 01/27/2024   Carotid artery stenosis 01/27/2024   Aortic atherosclerosis 01/25/2024   Facial weakness following cerebrovascular accident (CVA) 11/02/2023   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 11/02/2023   Oropharyngeal dysphagia 11/02/2023   Long term current use of anticoagulant therapy 11/02/2023   History of ischemic stroke 10/25/2023   Acquired thrombophilia 06/08/2023   Antiphospholipid antibody syndrome 03/04/2023   Neuropathy 03/04/2023   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   History of Bell's palsy    Tobacco abuse 01/27/2017   Seborrhea 10/01/2015   Type II diabetes mellitus with complication (HCC) 03/16/2015   Essential hypertension 03/16/2015   ONSET DATE: March 13th, 2025 (date of CVA)  REFERRING DIAG: Z41.73 (ICD-10-CM) - History of  ischemic stroke   THERAPY DIAG:  Muscle weakness (generalized)  Other abnormalities of gait and mobility  Decreased independence with transfers  Unable to perform basic transfers independently  Cerebrovascular accident (CVA) due to occlusion of right posterior cerebral artery (HCC)  Rationale for Evaluation and Treatment: Rehabilitation  SUBJECTIVE:                                                                                                                                                                                             SUBJECTIVE STATEMENT: Pt is a 74 year old male presenting to physical therapy with mobility concerns s/p ischemic (multifocal, R occipital) stroke  on March 13th, 2025. Pt reports left sided strength deficits affecting his mobility. Pt currently using a manual WC to get around and relies on his friend/roommate/tenant and a caretaker (who comes in the morning and at night) for assistance with transfers, feeding, dressing, and standing. Pt would like to increase his strength so that he can return to walking, which he states that he was able to do with use of SPC prior to stroke.   PERTINENT HISTORY: Pt had physical therapy for one month prior but came to clinic because of access to // bars with goals of walking. Pt has previous history of DVT, stage III CKD, type II DM, and HTN.   PAIN:  Are you having pain? No  PRECAUTIONS: None  RED FLAGS: None   WEIGHT BEARING RESTRICTIONS: No  FALLS: Has patient fallen in last 6 months? No  LIVING ENVIRONMENT: Lives with: his friend/tenant  Lives in: House/apartment Stairs: Yes: External: 2 steps; can reach both Has following equipment at home: Single point cane, Walker - 2 wheeled, Wheelchair (manual), and bed side commode  PLOF: Independent with basic ADLs, Independent with household mobility with device, and Independent with community mobility without device  PATIENT GOALS: To increase LE strength in order  to be able to walk with SPC  OBJECTIVE:  Note: Objective measures were completed at Evaluation unless otherwise noted.  DIAGNOSTIC FINDINGS: N/A  COGNITION: Overall cognitive status: Within functional limits for tasks assessed   SENSATION: Light touch: WFL (upper and lower dermatomes WNL)  COORDINATION: Finger to nose: impaired  Heel to shin: WFL (LLE limited by strength)  Rapid alternating movements: Alternating heel taps: WFL Alternating hand turns: WFL (limited by LUE strength)  MUSCLE LENGTH: Knee flexion contractures noted bilaterally   POSTURE: rounded shoulders, forward head, increased thoracic kyphosis, and weight shift right  UPPER EXTREMITY ROM:     Active  Right Eval Left Eval  Shoulder abduction 114 deg 78 deg   LOWER EXTREMITY MMT:    MMT Right Eval Left Eval  Hip flexion 4- 3  Hip extension    Hip abduction 4- 3  Hip adduction 4- 4-  Hip internal rotation    Hip external rotation    Knee flexion 3+ 3+  Knee extension 4 3  Ankle dorsiflexion 4- 3+  Ankle plantarflexion    Ankle inversion    Ankle eversion    (Blank rows = not tested)  UPPER EXTREMITY MMT: MMT Right eval Left eval  Shoulder flexion 4 3  Shoulder extension    Shoulder abduction 4- 3  Shoulder adduction    Shoulder extension    Shoulder internal rotation 4 3  Shoulder external rotation 4 3  Middle trapezius    Lower trapezius    Elbow flexion 4 3+  Elbow extension 4- 3  Wrist flexion    Wrist extension    Wrist ulnar deviation    Wrist radial deviation    Wrist pronation 4+ 4-  Wrist supination 4+ 4-  Grip strength 38.5# 47.2#   (Blank rows = not tested)   BED MOBILITY:  Findings: Sit to supine Complete Independence Supine to sit CGA  TRANSFERS: Sit to stand: Mod A  Assistive device utilized: Wheelchair (manual)     Stand to sit: CGA  Assistive device utilized: Grab bars      GAIT: Findings: Gait Characteristics: step to pattern, decreased step length-  Left, decreased stance time- Left, decreased stride length, decreased hip/knee flexion- Left, decreased ankle dorsiflexion- Left, trunk  flexed, narrow BOS, and poor foot clearance- Left, Distance walked: 40 ft in // bars, Assistive device utilized:// bars, Level of assistance: Min A, and Comments: Required verbal and tactile cueing keep trunk upright and prevent LLE from adducting past midline  FUNCTIONAL TESTS:  Will assess next session  PATIENT SURVEYS:  Will assess next session  05/09/24 Left Knee extension: -2 deg actively, 0 deg passively Right Knee extension: -9 deg actively, -1 deg passively Left hip flexion AROM: 117 deg Right hip flexion AROM: 113 deg Left shoulder flexion AROM: 130 deg Right shoulder flexion AROM: WNL Left shoulder abduction AROM: 117 deg Right shoulder abduction AROM: WNL  06/03/2024 MMT Right Left  Shoulder flexion 4 4-  Shoulder extension    Shoulder abduction 4- 4-  Shoulder adduction    Shoulder extension    Shoulder internal rotation 4 3+  Shoulder external rotation 4 4-   Active shoulder flexion of left shoulder measured at 135 degrees                                                                                                                    TREATMENT DATE: 06/28/2024  Subjective: Pt arrived to PT in w/c with no new complaints.  No c/o pain.  Pt reports he has been practicing walking at home with use of RW and assist from his caregiver on occasion. Pt states that he is usually sore and tired after physical therapy which is a good thing.  Therapeutic Activity:  Nustep L4 B UE/LE (cuing to maintain L LE in midline)- 10 min  Moderate cuing to correct L LE midline position    Forward and lateral walking in // bars, 2 passes down and back each, CGA from therapist for maintaining balance, safe turns, and tactile cues. Pt required SBA for controlled descent during stand to sit transfers.   Standing in // bars without UE support and raising  BUE overhead, 5 x to failure (best trials ~16 seconds)  Seated Tricep extensions, Lat retractions, Lat Pulldowns on Nautilus, 30#, 3 x 12 each  W/c to car (passenger side) transfer with min A.  Pt continues to pull up on car door with window open.  Slight improvement with standing tolerance/ knee extension requiring less assist from PT.  Pt able to get B LE in car once sitting on passenger chair.   Not performed today:  Standing in // bars without bilateral UE support and functional reaching for anterior cones outside of BOS, 1 x 10 total cones, CGA from therapist Sit to stands from elevated blue mat table and [employer] with focus on pushing off through triceps and proper foot positioning to assist with transfer, (wedge placed under heels to further encourage required forward weight shift/momentum) ~10 total attempts. Patient was able to stand upright momentarily and with RW requiring min and mod A for concentric lift off when pushing off blue mat table.  Forward ambulation with RW outside of //-bars in gym/ hallway for 48 feet. No LOB. Stand to  sit transfer required SBA from therapist for controlled descent to chair  PATIENT EDUCATION: Education details: Pt educated on diagnosis, prognosis, HEP, and POC Person educated: Patient Education method: Explanation, Demonstration, Tactile cues, and Verbal cues Education comprehension: verbalized understanding and returned demonstration  HOME EXERCISE PROGRAM: Access Code: [email or web address] URL: [email or web address] Date: 05/13/2024 Prepared by: Ozell Sero Exercises - Seated March - 2 x daily - 7 x weekly - 2 sets - 15 reps - Seated Long Arc Quad - 2 x daily - 7 x weekly - 2 sets - 15 reps - Seated Heel Raise - 2 x daily - 7 x weekly - 2 sets - 15 reps - Seated Elbow Extension with Self-Anchored Resistance - 2 x daily - 7 x weekly - 2 sets - 15 reps - Sit to Stand with Armchair - 1 x daily - 7 x weekly - 3 sets - 10 reps   GOALS: Goals reviewed  with patient? Yes  SHORT TERM GOALS: Target date: 06/02/2024  Pt to improve gross LUE strength to at least a 3+/5 in order to achieve greater independence with functional transfers.  Baseline: see above Goal status: GOAL MET  2.  Pt to improve left shoulder elevation AROM to at least 105 degrees to become symmetrical with RUE and have greater ease with reaching objects at home.  Baseline: left shoulder AROM: 78 deg; left shoulder AROM: 135 deg;  Goal status: GOAL MET  LONG TERM GOALS: Target date: 06/30/2024  Pt to be able to ambulate household distances (at least 300 ft) with use of RW and no more than CGA from therapist so that he can become independent with daily ADLs.  Baseline: nonambulatory with use of manual WC Goal status: INITIAL  2.  Pt to improve gross LLE strength to at least a 3+/5 in order to assist with forward ambulation in // bars or with RW so pt can become more independent with daily ADLs.  Baseline: see above Goal status: INITIAL  3.  Pt will require no greater than CGA with sit to stand transfers so that he is able to safely sit to stand at home with less assistance from his caretaker.  Baseline: mod A Goal status: INITIAL  ASSESSMENT:  CLINICAL IMPRESSION: Pt presents to physical therapy without any concerns and remains heavily motivated to participate in physical therapy sessions. Pt continues to show improvement with seated Nautilus activities requiring less verbal and tactile cueing for set up and proper form. Pt continues to have difficulty with functional reaching of left upper extremity due to impaired proprioception and kinesthesia. Pt able to maintain static stance with BUE raised overhead for roughly 16 seconds at best before requiring use of BUE on // bars to prevent LOB posteriorly. Pt. will continue to benefit from skilled PT services to increase LE strength, transfers/gait with appropriate assistive device.    OBJECTIVE IMPAIRMENTS: Abnormal gait,  decreased activity tolerance, decreased balance, decreased coordination, decreased endurance, decreased mobility, difficulty walking, decreased ROM, decreased strength, decreased safety awareness, impaired flexibility, and impaired UE functional use.   ACTIVITY LIMITATIONS: carrying, lifting, bending, standing, squatting, stairs, transfers, bathing, toileting, dressing, self feeding, reach over head, hygiene/grooming, and locomotion level  PARTICIPATION LIMITATIONS: meal prep, cleaning, laundry, driving, shopping, community activity, and yard work  PERSONAL FACTORS: Age, Fitness, Transportation, and 3+ comorbidities: T2DM, A-fib, HTN, Stage III CKD are also affecting patient's functional outcome.   REHAB POTENTIAL: Good  CLINICAL DECISION MAKING: Evolving/moderate complexity  EVALUATION COMPLEXITY: High  PLAN:  PT FREQUENCY:  2x/week  PT DURATION: 8 weeks  PLANNED INTERVENTIONS: 97164- PT Re-evaluation, 97110-Therapeutic exercises, 97530- Therapeutic activity, 97112- Neuromuscular re-education, 97535- Self Care, [id number]- Manual therapy, Patient/Family education, DME instructions, and Wheelchair mobility training  PLAN FOR NEXT SESSION: Continue transfer training with focus on controlled descent and UE pushoff and weight shifting anteriorly. Improve general endurance capacity/tolerance to activity.   UPDATE LONG TERM GOALS/ recert next visit  Ozell JAYSON Sero, PT, DPT # [id number] Curtistine Bracket, SPT  06/28/2024, 9:49 AM

## 2024-06-29 ENCOUNTER — Encounter: Payer: Self-pay | Admitting: Physical Therapy

## 2024-06-30 ENCOUNTER — Ambulatory Visit: Admitting: Physical Therapy

## 2024-06-30 DIAGNOSIS — M6281 Muscle weakness (generalized): Secondary | ICD-10-CM | POA: Diagnosis not present

## 2024-06-30 DIAGNOSIS — R6889 Other general symptoms and signs: Secondary | ICD-10-CM

## 2024-06-30 DIAGNOSIS — I63531 Cerebral infarction due to unspecified occlusion or stenosis of right posterior cerebral artery: Secondary | ICD-10-CM

## 2024-06-30 DIAGNOSIS — Z7409 Other reduced mobility: Secondary | ICD-10-CM

## 2024-06-30 DIAGNOSIS — R2689 Other abnormalities of gait and mobility: Secondary | ICD-10-CM

## 2024-06-30 NOTE — Therapy (Signed)
 OUTPATIENT PHYSICAL THERAPY NEURO TREATMENT  Patient Name: Corey Howard MRN: [id number] DOB:Feb 13, 1951, 73 y.o., male Today's Date: 06/30/2024  REFERRING PROVIDER: Justus Leita DEL, MD  END OF SESSION:  PT End of Session - 06/30/24 0941     Visit Number 16    Number of Visits 16    Date for Recertification  06/30/24    PT Start Time 0945    PT Stop Time 1030    PT Time Calculation (min) 45 min    Equipment Utilized During Treatment Gait belt    Activity Tolerance Patient tolerated treatment well;Patient limited by fatigue;No increased pain    Behavior During Therapy WFL for tasks assessed/performed            Past Medical History:  Diagnosis Date   Diabetes mellitus without complication (HCC)    DVT (deep venous thrombosis) (HCC)    History of DVT (deep vein thrombosis)    Hyperlipidemia    Past Surgical History:  Procedure Laterality Date   TONSILLECTOMY     Patient Active Problem List   Diagnosis Date Noted   Ambulatory dysfunction 01/27/2024   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) 01/27/2024   Paroxysmal atrial fibrillation (HCC) 01/27/2024   Carotid artery stenosis 01/27/2024   Aortic atherosclerosis 01/25/2024   Facial weakness following cerebrovascular accident (CVA) 11/02/2023   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 11/02/2023   Oropharyngeal dysphagia 11/02/2023   Long term current use of anticoagulant therapy 11/02/2023   History of ischemic stroke 10/25/2023   Acquired thrombophilia 06/08/2023   Antiphospholipid antibody syndrome 03/04/2023   Neuropathy 03/04/2023   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   History of Bell's palsy    Tobacco abuse 01/27/2017   Seborrhea 10/01/2015   Type II diabetes mellitus with complication (HCC) 03/16/2015   Essential hypertension 03/16/2015   ONSET DATE: March 13th, 2025 (date of CVA)  REFERRING DIAG: Z87.73 (ICD-10-CM) - History of  ischemic stroke   THERAPY DIAG:  Muscle weakness (generalized)  Other abnormalities of gait and mobility  Unable to perform basic transfers independently  Cerebrovascular accident (CVA) due to occlusion of right posterior cerebral artery (HCC)  Decreased independence with transfers  Rationale for Evaluation and Treatment: Rehabilitation  SUBJECTIVE:                                                                                                                                                                                             SUBJECTIVE STATEMENT: Pt is a 73 year old male presenting to physical therapy with mobility concerns s/p ischemic (multifocal, R occipital)  stroke on March 13th, 2025. Pt reports left sided strength deficits affecting his mobility. Pt currently using a manual WC to get around and relies on his friend/roommate/tenant and a caretaker (who comes in the morning and at night) for assistance with transfers, feeding, dressing, and standing. Pt would like to increase his strength so that he can return to walking, which he states that he was able to do with use of SPC prior to stroke.   PERTINENT HISTORY: Pt had physical therapy for one month prior but came to clinic because of access to // bars with goals of walking. Pt has previous history of DVT, stage III CKD, type II DM, and HTN.   PAIN:  Are you having pain? No  PRECAUTIONS: None  RED FLAGS: None   WEIGHT BEARING RESTRICTIONS: No  FALLS: Has patient fallen in last 6 months? No  LIVING ENVIRONMENT: Lives with: his friend/tenant  Lives in: House/apartment Stairs: Yes: External: 2 steps; can reach both Has following equipment at home: Single point cane, Walker - 2 wheeled, Wheelchair (manual), and bed side commode  PLOF: Independent with basic ADLs, Independent with household mobility with device, and Independent with community mobility without device  PATIENT GOALS: To increase LE strength in order  to be able to walk with SPC  OBJECTIVE:  Note: Objective measures were completed at Evaluation unless otherwise noted.  DIAGNOSTIC FINDINGS: N/A  COGNITION: Overall cognitive status: Within functional limits for tasks assessed   SENSATION: Light touch: WFL (upper and lower dermatomes WNL)  COORDINATION: Finger to nose: impaired  Heel to shin: WFL (LLE limited by strength)  Rapid alternating movements: Alternating heel taps: WFL Alternating hand turns: WFL (limited by LUE strength)  MUSCLE LENGTH: Knee flexion contractures noted bilaterally   POSTURE: rounded shoulders, forward head, increased thoracic kyphosis, and weight shift right  UPPER EXTREMITY ROM:     Active  Right Eval Left Eval  Shoulder abduction 114 deg 78 deg   LOWER EXTREMITY MMT:    MMT Right Eval Left Eval  Hip flexion 4- 3  Hip extension    Hip abduction 4- 3  Hip adduction 4- 4-  Hip internal rotation    Hip external rotation    Knee flexion 3+ 3+  Knee extension 4 3  Ankle dorsiflexion 4- 3+  Ankle plantarflexion    Ankle inversion    Ankle eversion    (Blank rows = not tested)  UPPER EXTREMITY MMT: MMT Right eval Left eval  Shoulder flexion 4 3  Shoulder extension    Shoulder abduction 4- 3  Shoulder adduction    Shoulder extension    Shoulder internal rotation 4 3  Shoulder external rotation 4 3  Middle trapezius    Lower trapezius    Elbow flexion 4 3+  Elbow extension 4- 3  Wrist flexion    Wrist extension    Wrist ulnar deviation    Wrist radial deviation    Wrist pronation 4+ 4-  Wrist supination 4+ 4-  Grip strength 38.5# 47.2#   (Blank rows = not tested)   BED MOBILITY:  Findings: Sit to supine Complete Independence Supine to sit CGA  TRANSFERS: Sit to stand: Mod A  Assistive device utilized: Wheelchair (manual)     Stand to sit: CGA  Assistive device utilized: Grab bars      GAIT: Findings: Gait Characteristics: step to pattern, decreased step length-  Left, decreased stance time- Left, decreased stride length, decreased hip/knee flexion- Left, decreased ankle dorsiflexion- Left,  trunk flexed, narrow BOS, and poor foot clearance- Left, Distance walked: 40 ft in // bars, Assistive device utilized:// bars, Level of assistance: Min A, and Comments: Required verbal and tactile cueing keep trunk upright and prevent LLE from adducting past midline  FUNCTIONAL TESTS:  Will assess next session  PATIENT SURVEYS:  Will assess next session  05/09/24 Left Knee extension: -2 deg actively, 0 deg passively Right Knee extension: -9 deg actively, -1 deg passively Left hip flexion AROM: 117 deg Right hip flexion AROM: 113 deg Left shoulder flexion AROM: 130 deg Right shoulder flexion AROM: WNL Left shoulder abduction AROM: 117 deg Right shoulder abduction AROM: WNL  06/03/2024 MMT Right Left  Shoulder flexion 4 4-  Shoulder extension    Shoulder abduction 4- 4-  Shoulder adduction    Shoulder extension    Shoulder internal rotation 4 3+  Shoulder external rotation 4 4-   Active shoulder flexion of left shoulder measured at 135 degrees                                                                                                                    TREATMENT DATE: 06/30/2024  Subjective: Pt arrived to PT in w/c with no new complaints.  No c/o pain.  Pt states that he is highly motivated to participate in physical therapy on this day.   Therapeutic Activity:   Forward and lateral walking in // bars, 2 passes down and back each, CGA from therapist for maintaining balance, safe turns, and tactile cues. Pt required SBA for controlled descent during stand to sit transfers.   Standing in // bars without UE support and raising BUE overhead, 5 x to failure (best trials ~35 seconds)  Seated Tricep extensions and Lat Pulldowns on Nautilus, 30#, 3 x 12 each  Seated Shoulder retractions on Nautilus, 40#, 3 x 12  W/c to car (passenger side) transfer  with min A.  Pt continues to pull up on car door with window open.  Slight improvement with standing tolerance/ knee extension requiring less assist from PT.  Pt able to get B LE in car once sitting on passenger chair.   Reviewed HEP: LAQs, seated marches, seated hamstring stretch, ankle pumps  Not performed today:  Standing in // bars without bilateral UE support and functional reaching for anterior cones outside of BOS, 1 x 10 total cones, CGA from therapist Sit to stands from elevated blue mat table and AirexPad with focus on pushing off through triceps and proper foot positioning to assist with transfer, (wedge placed under heels to further encourage required forward weight shift/momentum) ~10 total attempts. Patient was able to stand upright momentarily and with RW requiring min and mod A for concentric lift off when pushing off blue mat table.  Forward ambulation with RW outside of //-bars in gym/ hallway for 48 feet. No LOB. Stand to sit transfer required SBA from therapist for controlled descent to chair Nustep L4 B UE/LE (cuing to maintain L LE in midline)- 10 min  Moderate cuing to correct L LE midline position    PATIENT EDUCATION: Education details: Pt educated on diagnosis, prognosis, HEP, and POC Person educated: Patient Education method: Explanation, Demonstration, Tactile cues, and Verbal cues Education comprehension: verbalized understanding and returned demonstration  HOME EXERCISE PROGRAM: Access Code: [email or web address] URL: [email or web address] Date: 05/13/2024 Prepared by: Ozell Sero Exercises - Seated March - 2 x daily - 7 x weekly - 2 sets - 15 reps - Seated Long Arc Quad - 2 x daily - 7 x weekly - 2 sets - 15 reps - Seated Heel Raise - 2 x daily - 7 x weekly - 2 sets - 15 reps - Seated Elbow Extension with Self-Anchored Resistance - 2 x daily - 7 x weekly - 2 sets - 15 reps - Sit to Stand with Armchair - 1 x daily - 7 x weekly - 3 sets - 10 reps   GOALS: Goals  reviewed with patient? Yes  SHORT TERM GOALS: Target date: 06/02/2024  Pt to improve gross LUE strength to at least a 3+/5 in order to achieve greater independence with functional transfers.  Baseline: see above Goal status: GOAL MET  2.  Pt to improve left shoulder elevation AROM to at least 105 degrees to become symmetrical with RUE and have greater ease with reaching objects at home.  Baseline: left shoulder AROM: 78 deg; left shoulder AROM: 135 deg;  Goal status: GOAL MET  LONG TERM GOALS: Target date: 06/30/2024  Pt to be able to ambulate household distances (at least 300 ft) with use of RW and no more than CGA from therapist so that he can become independent with daily ADLs.  Baseline: nonambulatory with use of manual WC Goal status: INITIAL  2.  Pt to improve gross LLE strength to at least a 3+/5 in order to assist with forward ambulation in // bars or with RW so pt can become more independent with daily ADLs.  Baseline: see above Goal status: INITIAL  3.  Pt will require no greater than CGA with sit to stand transfers so that he is able to safely sit to stand at home with less assistance from his caretaker.  Baseline: mod A Goal status: INITIAL  ASSESSMENT:  CLINICAL IMPRESSION: Pt presents to physical therapy without any concerns on this day and remains heavily motivated to participate in physical therapy sessions. Reviewed HEP and pt able to demonstrated seated LAQs, marches, hamstring stretch, and ankle pumps with verbal cueing. Pt continues to have difficulty with functional reaching of left upper extremity due to impaired proprioception and kinesthesia. Pt  with improvement in maintaining static stance with BUE raised overhead for 35 seconds at best before requiring use of BUE on // bars to prevent LOB posteriorly. Pt. will continue to benefit from skilled PT services to increase LE strength, transfers/gait with appropriate assistive device.   Pt has attended and actively  participated in 16 tx sessions to date. Pt has seen improvements with gross LE strength and endurance, functional reaching/usage of left UE, balance, and general endurance/tolerance to activity. Pt with remaining deficits related to decreased coordination, abnormal gait, decreased endurance, decreased UE and LE strength, and decreased safety awareness. These deficits continue to limit the patients ability to perform functional transfers, perform ADLs, and ambulate household or community distances independently. Pt will continue to benefit from skilled physical therapy intervention 2x a week for 6 weeks to address remaining limitations and maximize functional independence.    OBJECTIVE IMPAIRMENTS: Abnormal gait, decreased activity tolerance, decreased  balance, decreased coordination, decreased endurance, decreased mobility, difficulty walking, decreased ROM, decreased strength, decreased safety awareness, impaired flexibility, and impaired UE functional use.   ACTIVITY LIMITATIONS: carrying, lifting, bending, standing, squatting, stairs, transfers, bathing, toileting, dressing, self feeding, reach over head, hygiene/grooming, and locomotion level  PARTICIPATION LIMITATIONS: meal prep, cleaning, laundry, driving, shopping, community activity, and yard work  PERSONAL FACTORS: Age, Fitness, Transportation, and 3+ comorbidities: T2DM, A-fib, HTN, Stage III CKD are also affecting patient's functional outcome.   REHAB POTENTIAL: Good  CLINICAL DECISION MAKING: Evolving/moderate complexity  EVALUATION COMPLEXITY: High  PLAN:  PT FREQUENCY: 2x/week  PT DURATION: 6 weeks  PLANNED INTERVENTIONS: 97164- PT Re-evaluation, 97110-Therapeutic exercises, 97530- Therapeutic activity, 97112- Neuromuscular re-education, 97535- Self Care, [id number]- Manual therapy, Patient/Family education, DME instructions, and Wheelchair mobility training  PLAN FOR NEXT SESSION: Continue transfer training with focus on controlled  descent and UE pushoff and weight shifting anteriorly. Improve general endurance capacity/tolerance to activity.   UPDATE LONG TERM GOALS/ RECERT next tx and check schedule  Ozell JAYSON Sero, PT, DPT # [phone] Curtistine Bracket, SPT  06/30/2024, 9:43 AM

## 2024-07-05 ENCOUNTER — Ambulatory Visit: Admitting: Physical Therapy

## 2024-07-11 ENCOUNTER — Encounter: Payer: Self-pay | Admitting: Physical Therapy

## 2024-07-11 ENCOUNTER — Ambulatory Visit: Attending: Internal Medicine | Admitting: Physical Therapy

## 2024-07-11 DIAGNOSIS — M6281 Muscle weakness (generalized): Secondary | ICD-10-CM | POA: Diagnosis present

## 2024-07-11 DIAGNOSIS — R2689 Other abnormalities of gait and mobility: Secondary | ICD-10-CM | POA: Insufficient documentation

## 2024-07-11 DIAGNOSIS — Z7409 Other reduced mobility: Secondary | ICD-10-CM | POA: Diagnosis present

## 2024-07-11 DIAGNOSIS — I63531 Cerebral infarction due to unspecified occlusion or stenosis of right posterior cerebral artery: Secondary | ICD-10-CM | POA: Insufficient documentation

## 2024-07-11 DIAGNOSIS — R6889 Other general symptoms and signs: Secondary | ICD-10-CM | POA: Insufficient documentation

## 2024-07-11 NOTE — Therapy (Signed)
 OUTPATIENT PHYSICAL THERAPY NEURO TREATMENT/ RECERTIFICATION  Patient Name: HIROKI WINT MRN: [id number] DOB:1950-12-04, 73 y.o., male Today's Date: 07/11/2024  REFERRING PROVIDER: Justus Leita DEL, MD  END OF SESSION:  PT End of Session - 07/11/24 1056     Visit Number 17    Number of Visits 29    Date for Recertification  08/22/24    PT Start Time 1102    PT Stop Time 1159    PT Time Calculation (min) 57 min    Equipment Utilized During Treatment Gait belt    Activity Tolerance Patient tolerated treatment well;Patient limited by fatigue;No increased pain    Behavior During Therapy WFL for tasks assessed/performed            Past Medical History:  Diagnosis Date   Diabetes mellitus without complication (HCC)    DVT (deep venous thrombosis) (HCC)    History of DVT (deep vein thrombosis)    Hyperlipidemia    Past Surgical History:  Procedure Laterality Date   TONSILLECTOMY     Patient Active Problem List   Diagnosis Date Noted   Ambulatory dysfunction 01/27/2024   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) 01/27/2024   Paroxysmal atrial fibrillation (HCC) 01/27/2024   Carotid artery stenosis 01/27/2024   Aortic atherosclerosis 01/25/2024   Facial weakness following cerebrovascular accident (CVA) 11/02/2023   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 11/02/2023   Oropharyngeal dysphagia 11/02/2023   Long term current use of anticoagulant therapy 11/02/2023   History of ischemic stroke 10/25/2023   Acquired thrombophilia 06/08/2023   Antiphospholipid antibody syndrome 03/04/2023   Neuropathy 03/04/2023   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   History of Bell's palsy    Tobacco abuse 01/27/2017   Seborrhea 10/01/2015   Type II diabetes mellitus with complication (HCC) 03/16/2015   Essential hypertension 03/16/2015   ONSET DATE: March 13th, 2025 (date of CVA)  REFERRING DIAG: Z51.73 (ICD-10-CM) -  History of ischemic stroke   THERAPY DIAG:  Muscle weakness (generalized)  Other abnormalities of gait and mobility  Unable to perform basic transfers independently  Cerebrovascular accident (CVA) due to occlusion of right posterior cerebral artery (HCC)  Decreased independence with transfers  Rationale for Evaluation and Treatment: Rehabilitation  SUBJECTIVE:                                                                                                                                                                                             SUBJECTIVE STATEMENT: Pt is a 73 year old male presenting to physical therapy with mobility concerns s/p ischemic (multifocal, R  occipital) stroke on March 13th, 2025. Pt reports left sided strength deficits affecting his mobility. Pt currently using a manual WC to get around and relies on his friend/roommate/tenant and a caretaker (who comes in the morning and at night) for assistance with transfers, feeding, dressing, and standing. Pt would like to increase his strength so that he can return to walking, which he states that he was able to do with use of SPC prior to stroke.   PERTINENT HISTORY: Pt had physical therapy for one month prior but came to clinic because of access to // bars with goals of walking. Pt has previous history of DVT, stage III CKD, type II DM, and HTN.   PAIN:  Are you having pain? No  PRECAUTIONS: None  RED FLAGS: None   WEIGHT BEARING RESTRICTIONS: No  FALLS: Has patient fallen in last 6 months? No  LIVING ENVIRONMENT: Lives with: his friend/tenant  Lives in: House/apartment Stairs: Yes: External: 2 steps; can reach both Has following equipment at home: Single point cane, Walker - 2 wheeled, Wheelchair (manual), and bed side commode  PLOF: Independent with basic ADLs, Independent with household mobility with device, and Independent with community mobility without device  PATIENT GOALS: To increase LE strength  in order to be able to walk with SPC  OBJECTIVE:  Note: Objective measures were completed at Evaluation unless otherwise noted.  DIAGNOSTIC FINDINGS: N/A  COGNITION: Overall cognitive status: Within functional limits for tasks assessed   SENSATION: Light touch: WFL (upper and lower dermatomes WNL)  COORDINATION: Finger to nose: impaired  Heel to shin: WFL (LLE limited by strength)  Rapid alternating movements: Alternating heel taps: WFL Alternating hand turns: WFL (limited by LUE strength)  MUSCLE LENGTH: Knee flexion contractures noted bilaterally   POSTURE: rounded shoulders, forward head, increased thoracic kyphosis, and weight shift right  UPPER EXTREMITY ROM:     Active  Right Eval Left Eval  Shoulder abduction 114 deg 78 deg   LOWER EXTREMITY MMT:    MMT Right Eval Left Eval  Hip flexion 4- 3  Hip extension    Hip abduction 4- 3  Hip adduction 4- 4-  Hip internal rotation    Hip external rotation    Knee flexion 3+ 3+  Knee extension 4 3  Ankle dorsiflexion 4- 3+  Ankle plantarflexion    Ankle inversion    Ankle eversion    (Blank rows = not tested)  UPPER EXTREMITY MMT: MMT Right eval Left eval  Shoulder flexion 4 3  Shoulder extension    Shoulder abduction 4- 3  Shoulder adduction    Shoulder extension    Shoulder internal rotation 4 3  Shoulder external rotation 4 3  Middle trapezius    Lower trapezius    Elbow flexion 4 3+  Elbow extension 4- 3  Wrist flexion    Wrist extension    Wrist ulnar deviation    Wrist radial deviation    Wrist pronation 4+ 4-  Wrist supination 4+ 4-  Grip strength 38.5# 47.2#   (Blank rows = not tested)   BED MOBILITY:  Findings: Sit to supine Complete Independence Supine to sit CGA  TRANSFERS: Sit to stand: Mod A  Assistive device utilized: Wheelchair (manual)     Stand to sit: CGA  Assistive device utilized: Grab bars      GAIT: Findings: Gait Characteristics: step to pattern, decreased step  length- Left, decreased stance time- Left, decreased stride length, decreased hip/knee flexion- Left, decreased ankle dorsiflexion-  Left, trunk flexed, narrow BOS, and poor foot clearance- Left, Distance walked: 40 ft in // bars, Assistive device utilized:// bars, Level of assistance: Min A, and Comments: Required verbal and tactile cueing keep trunk upright and prevent LLE from adducting past midline  FUNCTIONAL TESTS:  Will assess next session  PATIENT SURVEYS:  Will assess next session  05/09/24 Left Knee extension: -2 deg actively, 0 deg passively Right Knee extension: -9 deg actively, -1 deg passively Left hip flexion AROM: 117 deg Right hip flexion AROM: 113 deg Left shoulder flexion AROM: 130 deg Right shoulder flexion AROM: WNL Left shoulder abduction AROM: 117 deg Right shoulder abduction AROM: WNL  06/03/2024 MMT Right Left  Shoulder flexion 4 4-  Shoulder extension    Shoulder abduction 4- 4-  Shoulder adduction    Shoulder extension    Shoulder internal rotation 4 3+  Shoulder external rotation 4 4-   Active shoulder flexion of left shoulder measured at 135 degrees                                                                                                                    TREATMENT DATE: 07/11/2024  Subjective: Pt arrived to PT in w/c with no new complaints.  Pt states that he is highly motivated to participate in physical therapy on this day.   PT discussed pts. HEP and standing/walking.  Pt. Currently not walking at home with caregiver assist and PT expressed the benefits of carryover from PT tx. Session to walking with RW and assist of caregiver.    Therapeutic Activity:   Standing in //-bars without UE support and raising BUE overhead, 5 x to failure (best trials ~30 seconds).  CGA for safety.    Forward, backward and lateral walking in //-bars, 2 passes down and back each, CGA from therapist for maintaining balance, safe turns, and tactile cues. Pt required  SBA for controlled descent during stand to sit transfers.  Cuing to increase L hip flexion/ step length.    Walking from //-bars to hallway with RW and CGA while bring w/c behind pt. In case of fatigue/ LOB (36 feet). Pt. Was unable to stand from w/c into RW and requires //-bars to assist with standing/ pulling up.  PT attempted several times to assist pt. With sit to stands from w/c but marked posterior lean/ pushing and limited with standing/ knee extension without UE support on //-bars.  Amb. From //-bars to car in gym (30 feet)- marked fatigue/ limited L hip flexion and step pattern.    Nustep (transfer from w/c to Nustep with min. A): L4 B UE/LE 10 min.   Discussed standing/ walking at home with caregiver assist.    W/c to car (passenger side) transfer with min A.  Pt continues to pull up on car door with window open.  Slight improvement with standing tolerance/ knee extension requiring less assist from PT.  Pt able to get B LE in car once sitting on passenger chair.   Reviewed HEP:  LAQs, seated marches, seated hamstring stretch, ankle pumps   PATIENT EDUCATION: Education details: Pt educated on diagnosis, prognosis, HEP, and POC Person educated: Patient Education method: Explanation, Demonstration, Tactile cues, and Verbal cues Education comprehension: verbalized understanding and returned demonstration  HOME EXERCISE PROGRAM: Access Code: [email or web address] URL: [email or web address] Date: 05/13/2024 Prepared by: Ozell Sero Exercises - Seated March - 2 x daily - 7 x weekly - 2 sets - 15 reps - Seated Long Arc Quad - 2 x daily - 7 x weekly - 2 sets - 15 reps - Seated Heel Raise - 2 x daily - 7 x weekly - 2 sets - 15 reps - Seated Elbow Extension with Self-Anchored Resistance - 2 x daily - 7 x weekly - 2 sets - 15 reps - Sit to Stand with Armchair - 1 x daily - 7 x weekly - 3 sets - 10 reps   GOALS: Goals reviewed with patient? Yes  LONG TERM GOALS: Target date: 08/22/2024  Pt to  be able to ambulate household distances (at least 300 ft) with use of RW and no more than CGA from therapist so that he can become independent with daily ADLs.  Baseline: nonambulatory with use of manual WC.  12/1: ambulates short distances with RW and CGA/min. A  Goal status: Not met  2.  Pt to improve gross LLE strength to at least a 3+/5 in order to assist with forward ambulation in // bars or with RW so pt can become more independent with daily ADLs.  Baseline: see above.  12/1: see above Goal status: Partially met  3.  Pt will require no greater than CGA with sit to stand transfers so that he is able to safely sit to stand at home with less assistance from his caretaker.  Baseline: mod A.  12/1: mod. A (able to stand from w/c into //-bars with CGA but limited standing into RW).  Goal status: Not met  ASSESSMENT:  CLINICAL IMPRESSION: Pt presents to physical therapy without any concerns on this day and remains heavily motivated to participate in physical therapy sessions. Pt continues to have difficulty with functional reaching of left upper extremity due to impaired proprioception and kinesthesia. Pt  with improvement in maintaining static stance with BUE raised overhead for 35 seconds at best before requiring use of BUE on // bars to prevent LOB posteriorly.  Pt has seen improvements with gross LE strength and endurance, functional reaching/usage of left UE, balance, and general endurance/tolerance to activity. Pt with remaining deficits related to decreased coordination, abnormal gait, decreased endurance, decreased UE and LE strength, and decreased safety awareness. These deficits continue to limit the patients ability to perform functional transfers, perform ADLs, and ambulate household or community distances independently. Pt will continue to benefit from skilled physical therapy intervention 1-2x a week for 6 weeks to address remaining limitations and maximize functional independence.     OBJECTIVE IMPAIRMENTS: Abnormal gait, decreased activity tolerance, decreased balance, decreased coordination, decreased endurance, decreased mobility, difficulty walking, decreased ROM, decreased strength, decreased safety awareness, impaired flexibility, and impaired UE functional use.   ACTIVITY LIMITATIONS: carrying, lifting, bending, standing, squatting, stairs, transfers, bathing, toileting, dressing, self feeding, reach over head, hygiene/grooming, and locomotion level  PARTICIPATION LIMITATIONS: meal prep, cleaning, laundry, driving, shopping, community activity, and yard work  PERSONAL FACTORS: Age, Fitness, Transportation, and 3+ comorbidities: T2DM, A-fib, HTN, Stage III CKD are also affecting patient's functional outcome.   REHAB POTENTIAL: Good  CLINICAL DECISION MAKING: Evolving/moderate complexity  EVALUATION COMPLEXITY:  High  PLAN:  PT FREQUENCY: 1-2x/week  PT DURATION: 6 weeks  PLANNED INTERVENTIONS: 97164- PT Re-evaluation, 97110-Therapeutic exercises, 97530- Therapeutic activity, 97112- Neuromuscular re-education, 97535- Self Care, [id number]- Manual therapy, Patient/Family education, DME instructions, and Wheelchair mobility training  PLAN FOR NEXT SESSION: Continue transfer training with focus on controlled descent and UE pushoff and weight shifting anteriorly. Improve general endurance capacity/tolerance to activity.     Ozell JAYSON Sero, PT, DPT # [phone] 07/11/2024, 12:38 PM

## 2024-07-19 ENCOUNTER — Ambulatory Visit: Admitting: Physical Therapy

## 2024-07-28 ENCOUNTER — Ambulatory Visit: Admitting: Physical Therapy

## 2024-07-29 ENCOUNTER — Ambulatory Visit: Admitting: Physical Therapy

## 2024-07-29 ENCOUNTER — Encounter: Payer: Self-pay | Admitting: Physical Therapy

## 2024-07-29 DIAGNOSIS — R6889 Other general symptoms and signs: Secondary | ICD-10-CM

## 2024-07-29 DIAGNOSIS — M6281 Muscle weakness (generalized): Secondary | ICD-10-CM

## 2024-07-29 DIAGNOSIS — I63531 Cerebral infarction due to unspecified occlusion or stenosis of right posterior cerebral artery: Secondary | ICD-10-CM

## 2024-07-29 DIAGNOSIS — Z7409 Other reduced mobility: Secondary | ICD-10-CM

## 2024-07-29 DIAGNOSIS — R2689 Other abnormalities of gait and mobility: Secondary | ICD-10-CM

## 2024-07-29 NOTE — Therapy (Signed)
 "  OUTPATIENT PHYSICAL THERAPY NEURO TREATMENT  Patient Name: Corey Howard MRN: [id number] DOB:Dec 25, 1950, 73 y.o., male Today's Date: 07/30/2024  REFERRING PROVIDER: Justus Leita DEL, MD  END OF SESSION:  PT End of Session - 07/29/24 1013     Visit Number 18    Number of Visits 29    Date for Recertification  08/22/24    PT Start Time 1119    PT Stop Time 1208    PT Time Calculation (min) 49 min    Equipment Utilized During Treatment Gait belt    Activity Tolerance Patient tolerated treatment well;Patient limited by fatigue;No increased pain    Behavior During Therapy WFL for tasks assessed/performed         Past Medical History:  Diagnosis Date   Diabetes mellitus without complication (HCC)    DVT (deep venous thrombosis) (HCC)    History of DVT (deep vein thrombosis)    Hyperlipidemia    Past Surgical History:  Procedure Laterality Date   TONSILLECTOMY     Patient Active Problem List   Diagnosis Date Noted   Ambulatory dysfunction 01/27/2024   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) 01/27/2024   Paroxysmal atrial fibrillation (HCC) 01/27/2024   Carotid artery stenosis 01/27/2024   Aortic atherosclerosis 01/25/2024   Facial weakness following cerebrovascular accident (CVA) 11/02/2023   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 11/02/2023   Oropharyngeal dysphagia 11/02/2023   Long term current use of anticoagulant therapy 11/02/2023   History of ischemic stroke 10/25/2023   Acquired thrombophilia 06/08/2023   Antiphospholipid antibody syndrome 03/04/2023   Neuropathy 03/04/2023   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   History of Bell's palsy    Tobacco abuse 01/27/2017   Seborrhea 10/01/2015   Type II diabetes mellitus with complication (HCC) 03/16/2015   Essential hypertension 03/16/2015   ONSET DATE: March 13th, 2025 (date of CVA)  REFERRING DIAG: Z42.73 (ICD-10-CM) - History of ischemic  stroke   THERAPY DIAG:  Muscle weakness (generalized)  Other abnormalities of gait and mobility  Unable to perform basic transfers independently  Cerebrovascular accident (CVA) due to occlusion of right posterior cerebral artery (HCC)  Decreased independence with transfers  Rationale for Evaluation and Treatment: Rehabilitation  SUBJECTIVE:                                                                                                                                                                                             SUBJECTIVE STATEMENT: Pt is a 73 year old male presenting to physical therapy with mobility concerns s/p ischemic (multifocal, R occipital) stroke  on March 13th, 2025. Pt reports left sided strength deficits affecting his mobility. Pt currently using a manual WC to get around and relies on his friend/roommate/tenant and a caretaker (who comes in the morning and at night) for assistance with transfers, feeding, dressing, and standing. Pt would like to increase his strength so that he can return to walking, which he states that he was able to do with use of SPC prior to stroke.   PERTINENT HISTORY: Pt had physical therapy for one month prior but came to clinic because of access to // bars with goals of walking. Pt has previous history of DVT, stage III CKD, type II DM, and HTN.   PAIN:  Are you having pain? No  PRECAUTIONS: None  RED FLAGS: None   WEIGHT BEARING RESTRICTIONS: No  FALLS: Has patient fallen in last 6 months? No  LIVING ENVIRONMENT: Lives with: his friend/tenant  Lives in: House/apartment Stairs: Yes: External: 2 steps; can reach both Has following equipment at home: Single point cane, Walker - 2 wheeled, Wheelchair (manual), and bed side commode  PLOF: Independent with basic ADLs, Independent with household mobility with device, and Independent with community mobility without device  PATIENT GOALS: To increase LE strength in order to be able  to walk with SPC  OBJECTIVE:  Note: Objective measures were completed at Evaluation unless otherwise noted.  DIAGNOSTIC FINDINGS: N/A  COGNITION: Overall cognitive status: Within functional limits for tasks assessed   SENSATION: Light touch: WFL (upper and lower dermatomes WNL)  COORDINATION: Finger to nose: impaired  Heel to shin: WFL (LLE limited by strength)  Rapid alternating movements: Alternating heel taps: WFL Alternating hand turns: WFL (limited by LUE strength)  MUSCLE LENGTH: Knee flexion contractures noted bilaterally   POSTURE: rounded shoulders, forward head, increased thoracic kyphosis, and weight shift right  UPPER EXTREMITY ROM:     Active  Right Eval Left Eval  Shoulder abduction 114 deg 78 deg   LOWER EXTREMITY MMT:    MMT Right Eval Left Eval  Hip flexion 4- 3  Hip extension    Hip abduction 4- 3  Hip adduction 4- 4-  Hip internal rotation    Hip external rotation    Knee flexion 3+ 3+  Knee extension 4 3  Ankle dorsiflexion 4- 3+  Ankle plantarflexion    Ankle inversion    Ankle eversion    (Blank rows = not tested)  UPPER EXTREMITY MMT: MMT Right eval Left eval  Shoulder flexion 4 3  Shoulder extension    Shoulder abduction 4- 3  Shoulder adduction    Shoulder extension    Shoulder internal rotation 4 3  Shoulder external rotation 4 3  Middle trapezius    Lower trapezius    Elbow flexion 4 3+  Elbow extension 4- 3  Wrist flexion    Wrist extension    Wrist ulnar deviation    Wrist radial deviation    Wrist pronation 4+ 4-  Wrist supination 4+ 4-  Grip strength 38.5# 47.2#   (Blank rows = not tested)   BED MOBILITY:  Findings: Sit to supine Complete Independence Supine to sit CGA  TRANSFERS: Sit to stand: Mod A  Assistive device utilized: Wheelchair (manual)     Stand to sit: CGA  Assistive device utilized: Grab bars      GAIT: Findings: Gait Characteristics: step to pattern, decreased step length- Left,  decreased stance time- Left, decreased stride length, decreased hip/knee flexion- Left, decreased ankle dorsiflexion- Left, trunk  flexed, narrow BOS, and poor foot clearance- Left, Distance walked: 40 ft in // bars, Assistive device utilized:// bars, Level of assistance: Min A, and Comments: Required verbal and tactile cueing keep trunk upright and prevent LLE from adducting past midline  FUNCTIONAL TESTS:  Will assess next session  PATIENT SURVEYS:  Will assess next session  05/09/24 Left Knee extension: -2 deg actively, 0 deg passively Right Knee extension: -9 deg actively, -1 deg passively Left hip flexion AROM: 117 deg Right hip flexion AROM: 113 deg Left shoulder flexion AROM: 130 deg Right shoulder flexion AROM: WNL Left shoulder abduction AROM: 117 deg Right shoulder abduction AROM: WNL  06/03/2024 MMT Right Left  Shoulder flexion 4 4-  Shoulder extension    Shoulder abduction 4- 4-  Shoulder adduction    Shoulder extension    Shoulder internal rotation 4 3+  Shoulder external rotation 4 4-   Active shoulder flexion of left shoulder measured at 135 degrees                                                                                                                    TREATMENT DATE: 07/30/2024  Subjective: Pt arrived to PT in w/c with no new complaints.  Pt states that he is highly motivated to participate in physical therapy on this day.   PT discussed pts. HEP and standing/walking.  Pt. Currently not walking at home with caregiver assist and PT expressed the benefits of carryover from PT tx. Session to walking with RW and assist of caregiver.    Therapeutic Activity:   Forward, backward and lateral walking in //-bars, 2 passes down and back each, CGA from therapist for maintaining balance, safe turns, and tactile cues. Pt required SBA for controlled descent during stand to sit transfers.  Cuing to increase L hip flexion/ step length.    Standing in //-bars without UE  support and raising BUE overhead, 3x to failure.  CGA for safety.  Standing outside of //-bars with marching and hip abduction with limited L knee/ quad control requiring min. A from PT and //-bars to stand upright.    Seated Nautilus: 40# lat. Pull down 15x2/ 30# tricep extension 15x2 (fatigue)- assist for L hand grasping on wand.    Nustep (transfer from w/c to Nustep with min. A): L4 B UE/LE 10 min.   Discussed standing/ walking at home with caregiver assist.    W/c to car (passenger side) transfer with min A.  Pt continues to pull up on car door with window open.  Slight improvement with standing tolerance/ knee extension requiring less assist from PT.  Pt able to get B LE in car once sitting on passenger chair.    PATIENT EDUCATION: Education details: Pt educated on diagnosis, prognosis, HEP, and POC Person educated: Patient Education method: Explanation, Demonstration, Tactile cues, and Verbal cues Education comprehension: verbalized understanding and returned demonstration  HOME EXERCISE PROGRAM: Access Code: [email or web address] URL: [email or web address] Date: 05/13/2024 Prepared by: Ozell Sero Exercises - Seated March -  2 x daily - 7 x weekly - 2 sets - 15 reps - Seated Long Arc Quad - 2 x daily - 7 x weekly - 2 sets - 15 reps - Seated Heel Raise - 2 x daily - 7 x weekly - 2 sets - 15 reps - Seated Elbow Extension with Self-Anchored Resistance - 2 x daily - 7 x weekly - 2 sets - 15 reps - Sit to Stand with Armchair - 1 x daily - 7 x weekly - 3 sets - 10 reps   GOALS: Goals reviewed with patient? Yes  LONG TERM GOALS: Target date: 08/22/2024  Pt to be able to ambulate household distances (at least 300 ft) with use of RW and no more than CGA from therapist so that he can become independent with daily ADLs.  Baseline: nonambulatory with use of manual WC.  12/1: ambulates short distances with RW and CGA/min. A  Goal status: Not met  2.  Pt to improve gross LLE strength to at  least a 3+/5 in order to assist with forward ambulation in // bars or with RW so pt can become more independent with daily ADLs.  Baseline: see above.  12/1: see above Goal status: Partially met  3.  Pt will require no greater than CGA with sit to stand transfers so that he is able to safely sit to stand at home with less assistance from his caretaker.  Baseline: mod A.  12/1: mod. A (able to stand from w/c into //-bars with CGA but limited standing into RW).  Goal status: Not met  ASSESSMENT:  CLINICAL IMPRESSION: Pt continues to have difficulty standing from w/c without pulling up on //-bars.  Pt. Unable to stand from w/c into RW without mod-max A from PT.  Pt. Challenged with removing L hand from //-bars and grabbing onto RW to initiate walking.   Pt  with improvement in maintaining static stance with BUE raised overhead for >30 seconds at best before requiring use of BUE on // bars to prevent LOB posteriorly.  Limited walking endurance with use of RW/ PT assist to <20 feet.  Limited L hip flexion/ step length and moderate fatigue requiring seated rest breaks.  Pt with remaining deficits related to decreased coordination, abnormal gait, decreased endurance, decreased UE and LE strength, and decreased safety awareness. These deficits continue to limit the patients ability to perform functional transfers, perform ADLs, and ambulate household or community distances independently. Pt will continue to benefit from skilled physical therapy intervention 1-2x a week for 6 weeks to address remaining limitations and maximize functional independence.    OBJECTIVE IMPAIRMENTS: Abnormal gait, decreased activity tolerance, decreased balance, decreased coordination, decreased endurance, decreased mobility, difficulty walking, decreased ROM, decreased strength, decreased safety awareness, impaired flexibility, and impaired UE functional use.   ACTIVITY LIMITATIONS: carrying, lifting, bending, standing, squatting,  stairs, transfers, bathing, toileting, dressing, self feeding, reach over head, hygiene/grooming, and locomotion level  PARTICIPATION LIMITATIONS: meal prep, cleaning, laundry, driving, shopping, community activity, and yard work  PERSONAL FACTORS: Age, Fitness, Transportation, and 3+ comorbidities: T2DM, A-fib, HTN, Stage III CKD are also affecting patient's functional outcome.   REHAB POTENTIAL: Good  CLINICAL DECISION MAKING: Evolving/moderate complexity  EVALUATION COMPLEXITY: High  PLAN:  PT FREQUENCY: 1-2x/week  PT DURATION: 6 weeks  PLANNED INTERVENTIONS: 97164- PT Re-evaluation, 97110-Therapeutic exercises, 97530- Therapeutic activity, [id number]- Neuromuscular re-education, 97535- Self Care, [id number]- Manual therapy, Patient/Family education, DME instructions, and Wheelchair mobility training  PLAN FOR NEXT SESSION: Continue transfer training with  focus on controlled descent and UE pushoff and weight shifting anteriorly. Improve general endurance capacity/tolerance to activity.     Ozell JAYSON Sero, PT, DPT # [phone] 07/30/2024, 5:57 PM "

## 2024-08-03 ENCOUNTER — Ambulatory Visit: Admitting: Physical Therapy

## 2024-08-03 DIAGNOSIS — I63531 Cerebral infarction due to unspecified occlusion or stenosis of right posterior cerebral artery: Secondary | ICD-10-CM

## 2024-08-03 DIAGNOSIS — M6281 Muscle weakness (generalized): Secondary | ICD-10-CM | POA: Diagnosis not present

## 2024-08-03 DIAGNOSIS — Z7409 Other reduced mobility: Secondary | ICD-10-CM

## 2024-08-03 DIAGNOSIS — R2689 Other abnormalities of gait and mobility: Secondary | ICD-10-CM

## 2024-08-03 DIAGNOSIS — R6889 Other general symptoms and signs: Secondary | ICD-10-CM

## 2024-08-05 NOTE — Therapy (Signed)
 "  OUTPATIENT PHYSICAL THERAPY NEURO TREATMENT  Patient Name: COLEMAN KALAS MRN: [id number] DOB:05-17-1951, 73 y.o., male Today's Date: 08/03/2024  REFERRING PROVIDER: Justus Leita DEL, MD  END OF SESSION:  PT End of Session - 08/05/24 0817     Visit Number 19    Number of Visits 29    Date for Recertification  08/22/24    PT Start Time 1111    PT Stop Time 1202    PT Time Calculation (min) 51 min    Equipment Utilized During Treatment Gait belt    Activity Tolerance Patient tolerated treatment well;Patient limited by fatigue;No increased pain    Behavior During Therapy WFL for tasks assessed/performed         Past Medical History:  Diagnosis Date   Diabetes mellitus without complication (HCC)    DVT (deep venous thrombosis) (HCC)    History of DVT (deep vein thrombosis)    Hyperlipidemia    Past Surgical History:  Procedure Laterality Date   TONSILLECTOMY     Patient Active Problem List   Diagnosis Date Noted   Ambulatory dysfunction 01/27/2024   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) 01/27/2024   Paroxysmal atrial fibrillation (HCC) 01/27/2024   Carotid artery stenosis 01/27/2024   Aortic atherosclerosis 01/25/2024   Facial weakness following cerebrovascular accident (CVA) 11/02/2023   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 11/02/2023   Oropharyngeal dysphagia 11/02/2023   Long term current use of anticoagulant therapy 11/02/2023   History of ischemic stroke 10/25/2023   Acquired thrombophilia 06/08/2023   Antiphospholipid antibody syndrome 03/04/2023   Neuropathy 03/04/2023   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   History of Bell's palsy    Tobacco abuse 01/27/2017   Seborrhea 10/01/2015   Type II diabetes mellitus with complication (HCC) 03/16/2015   Essential hypertension 03/16/2015   ONSET DATE: March 13th, 2025 (date of CVA)  REFERRING DIAG: Z73.73 (ICD-10-CM) - History of ischemic  stroke   THERAPY DIAG:  Muscle weakness (generalized)  Other abnormalities of gait and mobility  Unable to perform basic transfers independently  Cerebrovascular accident (CVA) due to occlusion of right posterior cerebral artery (HCC)  Decreased independence with transfers  Rationale for Evaluation and Treatment: Rehabilitation  SUBJECTIVE:                                                                                                                                                                                             SUBJECTIVE STATEMENT: Pt is a 73 year old male presenting to physical therapy with mobility concerns s/p ischemic (multifocal, R occipital) stroke  on March 13th, 2025. Pt reports left sided strength deficits affecting his mobility. Pt currently using a manual WC to get around and relies on his friend/roommate/tenant and a caretaker (who comes in the morning and at night) for assistance with transfers, feeding, dressing, and standing. Pt would like to increase his strength so that he can return to walking, which he states that he was able to do with use of SPC prior to stroke.   PERTINENT HISTORY: Pt had physical therapy for one month prior but came to clinic because of access to // bars with goals of walking. Pt has previous history of DVT, stage III CKD, type II DM, and HTN.   PAIN:  Are you having pain? No  PRECAUTIONS: None  RED FLAGS: None   WEIGHT BEARING RESTRICTIONS: No  FALLS: Has patient fallen in last 6 months? No  LIVING ENVIRONMENT: Lives with: his friend/tenant  Lives in: House/apartment Stairs: Yes: External: 2 steps; can reach both Has following equipment at home: Single point cane, Walker - 2 wheeled, Wheelchair (manual), and bed side commode  PLOF: Independent with basic ADLs, Independent with household mobility with device, and Independent with community mobility without device  PATIENT GOALS: To increase LE strength in order to be able  to walk with SPC  OBJECTIVE:  Note: Objective measures were completed at Evaluation unless otherwise noted.  DIAGNOSTIC FINDINGS: N/A  COGNITION: Overall cognitive status: Within functional limits for tasks assessed   SENSATION: Light touch: WFL (upper and lower dermatomes WNL)  COORDINATION: Finger to nose: impaired  Heel to shin: WFL (LLE limited by strength)  Rapid alternating movements: Alternating heel taps: WFL Alternating hand turns: WFL (limited by LUE strength)  MUSCLE LENGTH: Knee flexion contractures noted bilaterally   POSTURE: rounded shoulders, forward head, increased thoracic kyphosis, and weight shift right  UPPER EXTREMITY ROM:     Active  Right Eval Left Eval  Shoulder abduction 114 deg 78 deg   LOWER EXTREMITY MMT:    MMT Right Eval Left Eval  Hip flexion 4- 3  Hip extension    Hip abduction 4- 3  Hip adduction 4- 4-  Hip internal rotation    Hip external rotation    Knee flexion 3+ 3+  Knee extension 4 3  Ankle dorsiflexion 4- 3+  Ankle plantarflexion    Ankle inversion    Ankle eversion    (Blank rows = not tested)  UPPER EXTREMITY MMT: MMT Right eval Left eval  Shoulder flexion 4 3  Shoulder extension    Shoulder abduction 4- 3  Shoulder adduction    Shoulder extension    Shoulder internal rotation 4 3  Shoulder external rotation 4 3  Middle trapezius    Lower trapezius    Elbow flexion 4 3+  Elbow extension 4- 3  Wrist flexion    Wrist extension    Wrist ulnar deviation    Wrist radial deviation    Wrist pronation 4+ 4-  Wrist supination 4+ 4-  Grip strength 38.5# 47.2#   (Blank rows = not tested)   BED MOBILITY:  Findings: Sit to supine Complete Independence Supine to sit CGA  TRANSFERS: Sit to stand: Mod A  Assistive device utilized: Wheelchair (manual)     Stand to sit: CGA  Assistive device utilized: Grab bars      GAIT: Findings: Gait Characteristics: step to pattern, decreased step length- Left,  decreased stance time- Left, decreased stride length, decreased hip/knee flexion- Left, decreased ankle dorsiflexion- Left, trunk  flexed, narrow BOS, and poor foot clearance- Left, Distance walked: 40 ft in // bars, Assistive device utilized:// bars, Level of assistance: Min A, and Comments: Required verbal and tactile cueing keep trunk upright and prevent LLE from adducting past midline  FUNCTIONAL TESTS:  Will assess next session  PATIENT SURVEYS:  Will assess next session  05/09/24 Left Knee extension: -2 deg actively, 0 deg passively Right Knee extension: -9 deg actively, -1 deg passively Left hip flexion AROM: 117 deg Right hip flexion AROM: 113 deg Left shoulder flexion AROM: 130 deg Right shoulder flexion AROM: WNL Left shoulder abduction AROM: 117 deg Right shoulder abduction AROM: WNL  06/03/2024 MMT Right Left  Shoulder flexion 4 4-  Shoulder extension    Shoulder abduction 4- 4-  Shoulder adduction    Shoulder extension    Shoulder internal rotation 4 3+  Shoulder external rotation 4 4-   Active shoulder flexion of left shoulder measured at 135 degrees                                                                                                                    TREATMENT DATE: 08/03/2024  Subjective: Pt arrived to PT in w/c with no new complaints.  PT discussed pts. HEP/ standing with caregiver assist and importance of walking with assist on a consistent basis to improve carryover from PT tx. Sessions.  No c/o pain.    Therapeutic Activity:    Sit to stand from w/c to //-bars (max assist from B UT to pull up on //-bars)- 5x.  Standing wt.shifting/ marching/ posture correction with B shoulder flexion (mirror feedback).    Forward and lateral walking in //-bars, 3 laps each prior to requested seated rest break.  CGA from therapist for maintaining balance, safe turns, and tactile cues. Good control with sitting in w/c and reaching back with R UE to arm rest.  Cuing  to increase L hip flexion/ step length.    Walking from //-bars with RW and CGA/min. A from PT for safety while bring w/c behind pt.  Amb: 12 feet/ 12 feet/ 10 feet.  Limited by fatigue/ LE limited step length.  Pt. Challenged with L UE grasp on RW and transitioning from //-bars to RW.  No LOB  Seated Nautilus: 40# lat. Pull down 15x2/ 30# tricep extension 15x2 (fatigue)- assist for L hand grasping on wand.    W/c to car (passenger side) transfer with min A.  Pt continues to pull up on car door with window open.  Slight improvement with standing tolerance/ knee extension requiring less assist from PT.  Pt able to get B LE in car once sitting on passenger chair.   Not today: Nustep (transfer from w/c to Nustep with min. A): L4 B UE/LE 10 min.   Discussed standing/ walking at home with caregiver assist.     PATIENT EDUCATION: Education details: Pt educated on diagnosis, prognosis, HEP, and POC Person educated: Patient Education method: Explanation, Demonstration, Tactile cues, and Verbal cues Education  comprehension: verbalized understanding and returned demonstration  HOME EXERCISE PROGRAM: Access Code: [email or web address] URL: [email or web address] Date: 05/13/2024 Prepared by: Ozell Sero Exercises - Seated March - 2 x daily - 7 x weekly - 2 sets - 15 reps - Seated Long Arc Quad - 2 x daily - 7 x weekly - 2 sets - 15 reps - Seated Heel Raise - 2 x daily - 7 x weekly - 2 sets - 15 reps - Seated Elbow Extension with Self-Anchored Resistance - 2 x daily - 7 x weekly - 2 sets - 15 reps - Sit to Stand with Armchair - 1 x daily - 7 x weekly - 3 sets - 10 reps   GOALS: Goals reviewed with patient? Yes  LONG TERM GOALS: Target date: 08/22/2024  Pt to be able to ambulate household distances (at least 300 ft) with use of RW and no more than CGA from therapist so that he can become independent with daily ADLs.  Baseline: nonambulatory with use of manual WC.  12/1: ambulates short distances with  RW and CGA/min. A  Goal status: Not met  2.  Pt to improve gross LLE strength to at least a 3+/5 in order to assist with forward ambulation in // bars or with RW so pt can become more independent with daily ADLs.  Baseline: see above.  12/1: see above Goal status: Partially met  3.  Pt will require no greater than CGA with sit to stand transfers so that he is able to safely sit to stand at home with less assistance from his caretaker.  Baseline: mod A.  12/1: mod. A (able to stand from w/c into //-bars with CGA but limited standing into RW).  Goal status: Not met  ASSESSMENT:  CLINICAL IMPRESSION: Pt continues to have difficulty standing from w/c without pulling up on //-bars.  Pt. Unable to stand from w/c into RW without mod-max A from PT.  Pt. Challenged with removing L hand from //-bars and grabbing onto RW to initiate walking.   Pt  with improvement in maintaining static stance with BUE raised overhead for >30 seconds at best before requiring use of BUE on // bars to prevent LOB posteriorly.  Limited walking endurance with use of RW/ PT assist to <20 feet.  Limited L hip flexion/ step length and moderate fatigue requiring seated rest breaks.  Pt with remaining deficits related to decreased coordination, abnormal gait, decreased endurance, decreased UE and LE strength, and decreased safety awareness. These deficits continue to limit the patients ability to perform functional transfers, perform ADLs, and ambulate household or community distances independently. Pt will continue to benefit from skilled physical therapy intervention 1-2x a week for 6 weeks to address remaining limitations and maximize functional independence.    OBJECTIVE IMPAIRMENTS: Abnormal gait, decreased activity tolerance, decreased balance, decreased coordination, decreased endurance, decreased mobility, difficulty walking, decreased ROM, decreased strength, decreased safety awareness, impaired flexibility, and impaired UE  functional use.   ACTIVITY LIMITATIONS: carrying, lifting, bending, standing, squatting, stairs, transfers, bathing, toileting, dressing, self feeding, reach over head, hygiene/grooming, and locomotion level  PARTICIPATION LIMITATIONS: meal prep, cleaning, laundry, driving, shopping, community activity, and yard work  PERSONAL FACTORS: Age, Fitness, Transportation, and 3+ comorbidities: T2DM, A-fib, HTN, Stage III CKD are also affecting patient's functional outcome.   REHAB POTENTIAL: Good  CLINICAL DECISION MAKING: Evolving/moderate complexity  EVALUATION COMPLEXITY: High  PLAN:  PT FREQUENCY: 1-2x/week  PT DURATION: 6 weeks  PLANNED INTERVENTIONS: 97164- PT Re-evaluation, 97110-Therapeutic exercises, 97530- Therapeutic activity,  02887- Neuromuscular re-education, [phone]- Self Care, [id number]- Manual therapy, Patient/Family education, DME instructions, and Wheelchair mobility training  PLAN FOR NEXT SESSION: Continue transfer training with focus on controlled descent and UE pushoff and weight shifting anteriorly. Improve general endurance capacity/tolerance to activity.  Ambulate with RW (goal of 50 feet and consistent L LE step through).  10th visit progress note.      Ozell JAYSON Sero, PT, DPT # [phone] 08/05/2024, 8:31 AM "

## 2024-08-09 ENCOUNTER — Ambulatory Visit: Admitting: Physical Therapy

## 2024-08-15 ENCOUNTER — Ambulatory Visit: Attending: Internal Medicine | Admitting: Physical Therapy

## 2024-08-15 DIAGNOSIS — R6889 Other general symptoms and signs: Secondary | ICD-10-CM | POA: Insufficient documentation

## 2024-08-15 DIAGNOSIS — M6281 Muscle weakness (generalized): Secondary | ICD-10-CM | POA: Insufficient documentation

## 2024-08-15 DIAGNOSIS — R2689 Other abnormalities of gait and mobility: Secondary | ICD-10-CM | POA: Insufficient documentation

## 2024-08-15 DIAGNOSIS — I63531 Cerebral infarction due to unspecified occlusion or stenosis of right posterior cerebral artery: Secondary | ICD-10-CM | POA: Insufficient documentation

## 2024-08-15 DIAGNOSIS — Z7409 Other reduced mobility: Secondary | ICD-10-CM | POA: Insufficient documentation

## 2024-08-17 ENCOUNTER — Ambulatory Visit: Admitting: Physical Therapy

## 2024-08-22 ENCOUNTER — Ambulatory Visit: Admitting: Physical Therapy

## 2024-08-22 ENCOUNTER — Encounter: Payer: Self-pay | Admitting: Physical Therapy

## 2024-08-22 DIAGNOSIS — Z7409 Other reduced mobility: Secondary | ICD-10-CM

## 2024-08-22 DIAGNOSIS — M6281 Muscle weakness (generalized): Secondary | ICD-10-CM

## 2024-08-22 DIAGNOSIS — I63531 Cerebral infarction due to unspecified occlusion or stenosis of right posterior cerebral artery: Secondary | ICD-10-CM

## 2024-08-22 DIAGNOSIS — R6889 Other general symptoms and signs: Secondary | ICD-10-CM | POA: Diagnosis present

## 2024-08-22 DIAGNOSIS — R2689 Other abnormalities of gait and mobility: Secondary | ICD-10-CM | POA: Diagnosis present

## 2024-08-22 NOTE — Therapy (Signed)
 "  OUTPATIENT PHYSICAL THERAPY NEURO TREATMENT Physical Therapy Progress Note  Dates of reporting period  06/10/24   to   08/22/24   Patient Name: Corey Howard MRN: [id number] DOB:06-19-1951, 74 y.o., male Today's Date: 08/22/2024  REFERRING PROVIDER: Justus Leita DEL, MD  END OF SESSION:  PT End of Session - 08/22/24 1845     Visit Number 20    Number of Visits 28    Date for Recertification  09/19/24    PT Start Time 1121    PT Stop Time 1206    PT Time Calculation (min) 45 min    Equipment Utilized During Treatment Gait belt    Activity Tolerance Patient tolerated treatment well;Patient limited by fatigue;No increased pain    Behavior During Therapy WFL for tasks assessed/performed         Past Medical History:  Diagnosis Date   Diabetes mellitus without complication (HCC)    DVT (deep venous thrombosis) (HCC)    History of DVT (deep vein thrombosis)    Hyperlipidemia    Past Surgical History:  Procedure Laterality Date   TONSILLECTOMY     Patient Active Problem List   Diagnosis Date Noted   Ambulatory dysfunction 01/27/2024   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) 01/27/2024   Paroxysmal atrial fibrillation (HCC) 01/27/2024   Carotid artery stenosis 01/27/2024   Aortic atherosclerosis 01/25/2024   Facial weakness following cerebrovascular accident (CVA) 11/02/2023   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 11/02/2023   Oropharyngeal dysphagia 11/02/2023   Long term current use of anticoagulant therapy 11/02/2023   History of ischemic stroke 10/25/2023   Acquired thrombophilia 06/08/2023   Antiphospholipid antibody syndrome 03/04/2023   Neuropathy 03/04/2023   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   History of Bell's palsy    Tobacco abuse 01/27/2017   Seborrhea 10/01/2015   Type II diabetes mellitus with complication (HCC) 03/16/2015   Essential hypertension 03/16/2015   ONSET DATE: March  13th, 2025 (date of CVA)  REFERRING DIAG: Z78.73 (ICD-10-CM) - History of ischemic stroke   THERAPY DIAG:  Muscle weakness (generalized)  Other abnormalities of gait and mobility  Unable to perform basic transfers independently  Cerebrovascular accident (CVA) due to occlusion of right posterior cerebral artery (HCC)  Decreased independence with transfers  Rationale for Evaluation and Treatment: Rehabilitation  SUBJECTIVE:                                                                                                                                                                                             SUBJECTIVE STATEMENT: Pt is  a 74 year old male presenting to physical therapy with mobility concerns s/p ischemic (multifocal, R occipital) stroke on March 13th, 2025. Pt reports left sided strength deficits affecting his mobility. Pt currently using a manual WC to get around and relies on his friend/roommate/tenant and a caretaker (who comes in the morning and at night) for assistance with transfers, feeding, dressing, and standing. Pt would like to increase his strength so that he can return to walking, which he states that he was able to do with use of SPC prior to stroke.   PERTINENT HISTORY: Pt had physical therapy for one month prior but came to clinic because of access to // bars with goals of walking. Pt has previous history of DVT, stage III CKD, type II DM, and HTN.   PAIN:  Are you having pain? No  PRECAUTIONS: None  RED FLAGS: None   WEIGHT BEARING RESTRICTIONS: No  FALLS: Has patient fallen in last 6 months? No  LIVING ENVIRONMENT: Lives with: his friend/tenant  Lives in: House/apartment Stairs: Yes: External: 2 steps; can reach both Has following equipment at home: Single point cane, Walker - 2 wheeled, Wheelchair (manual), and bed side commode  PLOF: Independent with basic ADLs, Independent with household mobility with device, and Independent with community  mobility without device  PATIENT GOALS: To increase LE strength in order to be able to walk with SPC  OBJECTIVE:  Note: Objective measures were completed at Evaluation unless otherwise noted.  DIAGNOSTIC FINDINGS: N/A  COGNITION: Overall cognitive status: Within functional limits for tasks assessed   SENSATION: Light touch: WFL (upper and lower dermatomes WNL)  COORDINATION: Finger to nose: impaired  Heel to shin: WFL (LLE limited by strength)  Rapid alternating movements: Alternating heel taps: WFL Alternating hand turns: WFL (limited by LUE strength)  MUSCLE LENGTH: Knee flexion contractures noted bilaterally   POSTURE: rounded shoulders, forward head, increased thoracic kyphosis, and weight shift right  UPPER EXTREMITY ROM:     Active  Right Eval Left Eval  Shoulder abduction 114 deg 78 deg   LOWER EXTREMITY MMT:    MMT Right Eval Left Eval  Hip flexion 4- 3  Hip extension    Hip abduction 4- 3  Hip adduction 4- 4-  Hip internal rotation    Hip external rotation    Knee flexion 3+ 3+  Knee extension 4 3  Ankle dorsiflexion 4- 3+  Ankle plantarflexion    Ankle inversion    Ankle eversion    (Blank rows = not tested)  UPPER EXTREMITY MMT: MMT Right eval Left eval  Shoulder flexion 4 3  Shoulder extension    Shoulder abduction 4- 3  Shoulder adduction    Shoulder extension    Shoulder internal rotation 4 3  Shoulder external rotation 4 3  Middle trapezius    Lower trapezius    Elbow flexion 4 3+  Elbow extension 4- 3  Wrist flexion    Wrist extension    Wrist ulnar deviation    Wrist radial deviation    Wrist pronation 4+ 4-  Wrist supination 4+ 4-  Grip strength 38.5# 47.2#   (Blank rows = not tested)   BED MOBILITY:  Findings: Sit to supine Complete Independence Supine to sit CGA  TRANSFERS: Sit to stand: Mod A  Assistive device utilized: Wheelchair (manual)     Stand to sit: CGA  Assistive device utilized: Grab bars       GAIT: Findings: Gait Characteristics: step to pattern, decreased step  length- Left, decreased stance time- Left, decreased stride length, decreased hip/knee flexion- Left, decreased ankle dorsiflexion- Left, trunk flexed, narrow BOS, and poor foot clearance- Left, Distance walked: 40 ft in // bars, Assistive device utilized:// bars, Level of assistance: Min A, and Comments: Required verbal and tactile cueing keep trunk upright and prevent LLE from adducting past midline  FUNCTIONAL TESTS:  Will assess next session  PATIENT SURVEYS:  Will assess next session  05/09/24 Left Knee extension: -2 deg actively, 0 deg passively Right Knee extension: -9 deg actively, -1 deg passively Left hip flexion AROM: 117 deg Right hip flexion AROM: 113 deg Left shoulder flexion AROM: 130 deg Right shoulder flexion AROM: WNL Left shoulder abduction AROM: 117 deg Right shoulder abduction AROM: WNL  06/03/2024 MMT Right Left  Shoulder flexion 4 4-  Shoulder extension    Shoulder abduction 4- 4-  Shoulder adduction    Shoulder extension    Shoulder internal rotation 4 3+  Shoulder external rotation 4 4-   Active shoulder flexion of left shoulder measured at 135 degrees                                                                                                                    TREATMENT DATE: 08/22/2024  Subjective:   Pt. Has not been seen by PT over past couple weeks due to scheduling issues/ holiday.  Pt arrived to PT in w/c with no new complaints/ no pain.  Pts. L arm has 3 band aids and numerous scabs/ redness due to blood thinner/ bumpy arm.    Therapeutic Activity:    Sit to stand from w/c to //-bars (max assist from B UT to pull up on //-bars)- 4x.  Standing wt.shifting/ marching/ posture correction with B shoulder flexion (mirror feedback).  Pt. Limited with B shoulder flexion in //-bars and unable to hold >5 sec.  Moderate cuing/ assist to correct hip position to midline.   Forward  and backwards walking in //-bars, 3 laps each prior to requested seated rest break.  CGA from therapist for maintaining balance, safe turns, and tactile cues. Good control with sitting in w/c and reaching back with R UE to arm rest.  Cuing to increase L hip flexion/ step length.    Walking from //-bars with RW and CGA/min. A from PT for safety while bring w/c behind pt.  Amb: 33 feet. With moderate encouragement to increase distance before sitting down.  Limited by fatigue/ LE limited step length.  No LOB but tired sit.    Seated Nautilus: 40# lat. Pull down 15x2 assist for L hand grasping on wand.    Nustep L4 B UE/LE 10 min. (Consistent cadence)- 0.25 miles.    W/c to car (passenger side) transfer with min A.  Pt continues to pull up on car door with window open.  Slight improvement with standing tolerance/ knee extension requiring less assist from PT.  Pt able to get B LE in car once sitting on passenger chair.   See  updated goals.     PATIENT EDUCATION: Education details: Pt educated on diagnosis, prognosis, HEP, and POC Person educated: Patient Education method: Explanation, Demonstration, Tactile cues, and Verbal cues Education comprehension: verbalized understanding and returned demonstration  HOME EXERCISE PROGRAM: Access Code: [email or web address] URL: [email or web address] Date: 05/13/2024 Prepared by: Ozell Sero Exercises - Seated March - 2 x daily - 7 x weekly - 2 sets - 15 reps - Seated Long Arc Quad - 2 x daily - 7 x weekly - 2 sets - 15 reps - Seated Heel Raise - 2 x daily - 7 x weekly - 2 sets - 15 reps - Seated Elbow Extension with Self-Anchored Resistance - 2 x daily - 7 x weekly - 2 sets - 15 reps - Sit to Stand with Armchair - 1 x daily - 7 x weekly - 3 sets - 10 reps   GOALS: Goals reviewed with patient? Yes  LONG TERM GOALS: Target date: 09/19/2024  Pt to be able to ambulate household distances (at least 300 ft) with use of RW and no more than CGA from therapist so  that he can become independent with daily ADLs.  Baseline: nonambulatory with use of manual WC.  12/1: ambulates short distances with RW and CGA/min. A.  1/12: amb. 33 feet with RW Goal status: Not met  2.  Pt to improve gross LLE strength to at least a 3+/5 in order to assist with forward ambulation in // bars or with RW so pt can become more independent with daily ADLs.  Baseline: see above.  12/1: see above.  1/12: see above Goal status: Partially met  3.  Pt will require no greater than CGA with sit to stand transfers so that he is able to safely sit to stand at home with less assistance from his caretaker.  Baseline: mod A.  12/1: mod. A (able to stand from w/c into //-bars with CGA but limited standing into RW).  Goal status: Not met  ASSESSMENT:  CLINICAL IMPRESSION: Pt continues to have difficulty standing from w/c without pulling up on //-bars.  Pt. Unable to stand from w/c into RW without mod-max A from PT.   Pt  with improvement in maintaining static stance but limited B sh. Overhead flexion for >5 seconds today. Limited walking endurance with use of RW/ PT assist to 33 feet.  Limited L hip flexion/ step length and moderate fatigue requiring seated rest breaks.  Pt with remaining deficits related to decreased coordination, abnormal gait, decreased endurance, decreased UE and LE strength, and decreased safety awareness. These deficits continue to limit the patients ability to perform functional transfers, perform ADLs, and ambulate household or community distances independently. Pt will continue to benefit from skilled physical therapy intervention 1-2x a week for 4 weeks to address remaining limitations and maximize functional independence.    OBJECTIVE IMPAIRMENTS: Abnormal gait, decreased activity tolerance, decreased balance, decreased coordination, decreased endurance, decreased mobility, difficulty walking, decreased ROM, decreased strength, decreased safety awareness, impaired  flexibility, and impaired UE functional use.   ACTIVITY LIMITATIONS: carrying, lifting, bending, standing, squatting, stairs, transfers, bathing, toileting, dressing, self feeding, reach over head, hygiene/grooming, and locomotion level  PARTICIPATION LIMITATIONS: meal prep, cleaning, laundry, driving, shopping, community activity, and yard work  PERSONAL FACTORS: Age, Fitness, Transportation, and 3+ comorbidities: T2DM, A-fib, HTN, Stage III CKD are also affecting patient's functional outcome.   REHAB POTENTIAL: Good  CLINICAL DECISION MAKING: Evolving/moderate complexity  EVALUATION COMPLEXITY: High  PLAN:  PT FREQUENCY: 1-2x/week  PT DURATION:  4 weeks  PLANNED INTERVENTIONS: 97164- PT Re-evaluation, 97110-Therapeutic exercises, 97530- Therapeutic activity, 97112- Neuromuscular re-education, 97535- Self Care, [id number]- Manual therapy, Patient/Family education, DME instructions, and Wheelchair mobility training  PLAN FOR NEXT SESSION: Continue transfer training with focus on controlled descent and UE pushoff and weight shifting anteriorly. Improve general endurance capacity/tolerance to activity.  Ambulate with RW (goal of 50 feet and consistent L LE step through).      Ozell JAYSON Sero, PT, DPT # [phone] 08/22/2024, 6:49 PM "

## 2024-08-24 ENCOUNTER — Encounter: Payer: Self-pay | Admitting: Physical Therapy

## 2024-08-24 ENCOUNTER — Ambulatory Visit: Admitting: Physical Therapy

## 2024-08-24 DIAGNOSIS — R2689 Other abnormalities of gait and mobility: Secondary | ICD-10-CM

## 2024-08-24 DIAGNOSIS — R6889 Other general symptoms and signs: Secondary | ICD-10-CM

## 2024-08-24 DIAGNOSIS — M6281 Muscle weakness (generalized): Secondary | ICD-10-CM | POA: Diagnosis not present

## 2024-08-24 DIAGNOSIS — Z7409 Other reduced mobility: Secondary | ICD-10-CM

## 2024-08-24 DIAGNOSIS — I63531 Cerebral infarction due to unspecified occlusion or stenosis of right posterior cerebral artery: Secondary | ICD-10-CM

## 2024-08-24 NOTE — Therapy (Signed)
 "  OUTPATIENT PHYSICAL THERAPY NEURO TREATMENT  Patient Name: Corey Howard MRN: [id number] DOB:01-Apr-1951, 74 y.o., male Today's Date: 08/24/2024  REFERRING PROVIDER: Justus Leita DEL, MD  END OF SESSION:  PT End of Session - 08/24/24 1113     Visit Number 21    Number of Visits 28    Date for Recertification  09/19/24    Equipment Utilized During Treatment Gait belt    Activity Tolerance Patient tolerated treatment well;Patient limited by fatigue;No increased pain    Behavior During Therapy [facility] for tasks assessed/performed         1115 to 1204  (49 minutes).  Past Medical History:  Diagnosis Date   Diabetes mellitus without complication (HCC)    DVT (deep venous thrombosis) (HCC)    History of DVT (deep vein thrombosis)    Hyperlipidemia    Past Surgical History:  Procedure Laterality Date   TONSILLECTOMY     Patient Active Problem List   Diagnosis Date Noted   Ambulatory dysfunction 01/27/2024   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) 01/27/2024   Paroxysmal atrial fibrillation (HCC) 01/27/2024   Carotid artery stenosis 01/27/2024   Aortic atherosclerosis 01/25/2024   Facial weakness following cerebrovascular accident (CVA) 11/02/2023   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 11/02/2023   Oropharyngeal dysphagia 11/02/2023   Long term current use of anticoagulant therapy 11/02/2023   History of ischemic stroke 10/25/2023   Acquired thrombophilia 06/08/2023   Antiphospholipid antibody syndrome 03/04/2023   Neuropathy 03/04/2023   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   History of Bell's palsy    Tobacco abuse 01/27/2017   Seborrhea 10/01/2015   Type II diabetes mellitus with complication (HCC) 03/16/2015   Essential hypertension 03/16/2015   ONSET DATE: March 13th, 2025 (date of CVA)  REFERRING DIAG: Z85.73 (ICD-10-CM) - History of ischemic stroke   THERAPY DIAG:  Muscle weakness  (generalized)  Other abnormalities of gait and mobility  Unable to perform basic transfers independently  Cerebrovascular accident (CVA) due to occlusion of right posterior cerebral artery (HCC)  Decreased independence with transfers  Rationale for Evaluation and Treatment: Rehabilitation  SUBJECTIVE:                                                                                                                                                                                             SUBJECTIVE STATEMENT: Pt is a 74 year old male presenting to physical therapy with mobility concerns s/p ischemic (multifocal, R occipital) stroke on March 13th, 2025. Pt reports left sided strength deficits affecting his mobility. Pt currently using  a manual WC to get around and relies on his friend/roommate/tenant and a caretaker (who comes in the morning and at night) for assistance with transfers, feeding, dressing, and standing. Pt would like to increase his strength so that he can return to walking, which he states that he was able to do with use of SPC prior to stroke.   PERTINENT HISTORY: Pt had physical therapy for one month prior but came to clinic because of access to // bars with goals of walking. Pt has previous history of DVT, stage III CKD, type II DM, and HTN.   PAIN:  Are you having pain? No  PRECAUTIONS: None  RED FLAGS: None   WEIGHT BEARING RESTRICTIONS: No  FALLS: Has patient fallen in last 6 months? No  LIVING ENVIRONMENT: Lives with: his friend/tenant  Lives in: House/apartment Stairs: Yes: External: 2 steps; can reach both Has following equipment at home: Single point cane, Walker - 2 wheeled, Wheelchair (manual), and bed side commode  PLOF: Independent with basic ADLs, Independent with household mobility with device, and Independent with community mobility without device  PATIENT GOALS: To increase LE strength in order to be able to walk with SPC  OBJECTIVE:  Note:  Objective measures were completed at Evaluation unless otherwise noted.  DIAGNOSTIC FINDINGS: N/A  COGNITION: Overall cognitive status: Within functional limits for tasks assessed   SENSATION: Light touch: WFL (upper and lower dermatomes WNL)  COORDINATION: Finger to nose: impaired  Heel to shin: WFL (LLE limited by strength)  Rapid alternating movements: Alternating heel taps: WFL Alternating hand turns: WFL (limited by LUE strength)  MUSCLE LENGTH: Knee flexion contractures noted bilaterally   POSTURE: rounded shoulders, forward head, increased thoracic kyphosis, and weight shift right  UPPER EXTREMITY ROM:     Active  Right Eval Left Eval  Shoulder abduction 114 deg 78 deg   LOWER EXTREMITY MMT:    MMT Right Eval Left Eval  Hip flexion 4- 3  Hip extension    Hip abduction 4- 3  Hip adduction 4- 4-  Hip internal rotation    Hip external rotation    Knee flexion 3+ 3+  Knee extension 4 3  Ankle dorsiflexion 4- 3+  Ankle plantarflexion    Ankle inversion    Ankle eversion    (Blank rows = not tested)  UPPER EXTREMITY MMT: MMT Right eval Left eval  Shoulder flexion 4 3  Shoulder extension    Shoulder abduction 4- 3  Shoulder adduction    Shoulder extension    Shoulder internal rotation 4 3  Shoulder external rotation 4 3  Middle trapezius    Lower trapezius    Elbow flexion 4 3+  Elbow extension 4- 3  Wrist flexion    Wrist extension    Wrist ulnar deviation    Wrist radial deviation    Wrist pronation 4+ 4-  Wrist supination 4+ 4-  Grip strength 38.5# 47.2#   (Blank rows = not tested)   BED MOBILITY:  Findings: Sit to supine Complete Independence Supine to sit CGA  TRANSFERS: Sit to stand: Mod A  Assistive device utilized: Wheelchair (manual)     Stand to sit: CGA  Assistive device utilized: Grab bars      GAIT: Findings: Gait Characteristics: step to pattern, decreased step length- Left, decreased stance time- Left, decreased stride  length, decreased hip/knee flexion- Left, decreased ankle dorsiflexion- Left, trunk flexed, narrow BOS, and poor foot clearance- Left, Distance walked: 40 ft in // bars, Assistive  device utilized:// bars, Level of assistance: Min A, and Comments: Required verbal and tactile cueing keep trunk upright and prevent LLE from adducting past midline  FUNCTIONAL TESTS:  Will assess next session  PATIENT SURVEYS:  Will assess next session  05/09/24 Left Knee extension: -2 deg actively, 0 deg passively Right Knee extension: -9 deg actively, -1 deg passively Left hip flexion AROM: 117 deg Right hip flexion AROM: 113 deg Left shoulder flexion AROM: 130 deg Right shoulder flexion AROM: WNL Left shoulder abduction AROM: 117 deg Right shoulder abduction AROM: WNL  06/03/2024 MMT Right Left  Shoulder flexion 4 4-  Shoulder extension    Shoulder abduction 4- 4-  Shoulder adduction    Shoulder extension    Shoulder internal rotation 4 3+  Shoulder external rotation 4 4-   Active shoulder flexion of left shoulder measured at 135 degrees                                                                                                                    TREATMENT DATE: 08/24/2024  Subjective:    Pt was transferred to w/c with assistance of PT upon arrival. Pt's. L arm has 3 band aids and numerous scabs/ redness due to blood thinner/ bumpy arm.  Pt reports no pain upon arrival.  Pt. Ready to work with PT to increase strength/ standing tolerance and walking.  Pt. Reports his 1 year anniversary for CVA is next month.    Therapeutic Activity:    Sit to stand to //-bars from w/c  with mod assist B UE to pull up on // bars. Standing weight shifting to left and right.   3 laps walking finished by walking backwards with B UE with CGA. Cuing to increase L hip flexion/ step length.   High march walking 3 laps followed by walking backwards and lateral walking 2 laps with CGA with pt verbal cuing to keep L leg  in-line with right.   STS from w/c with focus on leaning forward to get up to RW with proper technique before initiating gait. Pt was cued to lean forward and push off arm rests with hands from w/c and required mod assistance to get up to // bars. Good control with return to sitting in w/c and reaching back with R UE to arm rest.  Cuing to increase L hip flexion/ step length.  Moderate posterior pushing/ leaning with STS from w/c.    Walking from //-bars with RW and CGA/min. A from PT for safety.  Pt. amb: 20 feet prior to rest break and pt. Benefits from keeping w/c close for safety.  Limited by fatigue/ LE limited step length.  Cuing to increase L hip flexion/ step length. Followed by 2nd walk of 17 ft prior to patients L knee buckling and was controlled by PT's and was safely lowered into w/c.   Seated bilateral hamstring stretch on 6 step for 1 min.  Pain limited with any OP/ passive stretching  W/c to car (passenger side)  transfer with mod A x 2 for safety.  Pt continues to pull up on car door with window open.  Slight improvement with standing tolerance/ knee extension requiring less assist from PT.  Pt able to get B LE in car once sitting on passenger chair.      PATIENT EDUCATION: Education details: Pt educated on diagnosis, prognosis, HEP, and POC Person educated: Patient Education method: Explanation, Demonstration, Tactile cues, and Verbal cues Education comprehension: verbalized understanding and returned demonstration  HOME EXERCISE PROGRAM: Access Code: [email or web address] URL: [email or web address] Date: 05/13/2024 Prepared by: Ozell Sero Exercises - Seated March - 2 x daily - 7 x weekly - 2 sets - 15 reps - Seated Long Arc Quad - 2 x daily - 7 x weekly - 2 sets - 15 reps - Seated Heel Raise - 2 x daily - 7 x weekly - 2 sets - 15 reps - Seated Elbow Extension with Self-Anchored Resistance - 2 x daily - 7 x weekly - 2 sets - 15 reps - Sit to Stand with Armchair - 1 x daily - 7  x weekly - 3 sets - 10 reps   GOALS: Goals reviewed with patient? Yes  LONG TERM GOALS: Target date: 09/19/2024  Pt to be able to ambulate household distances (at least 300 ft) with use of RW and no more than CGA from therapist so that he can become independent with daily ADLs.  Baseline: nonambulatory with use of manual WC.  12/1: ambulates short distances with RW and CGA/min. A.  1/12: amb. 33 feet with RW Goal status: Not met  2.  Pt to improve gross LLE strength to at least a 3+/5 in order to assist with forward ambulation in // bars or with RW so pt can become more independent with daily ADLs.  Baseline: see above.  12/1: see above.  1/12: see above Goal status: Partially met  3.  Pt will require no greater than CGA with sit to stand transfers so that he is able to safely sit to stand at home with less assistance from his caretaker.  Baseline: mod A.  12/1: mod. A (able to stand from w/c into //-bars with CGA but limited standing into RW).  Goal status: Not met  ASSESSMENT:  CLINICAL IMPRESSION: Pt has difficulty standing from w/c without pulling on //-bars. Pt is able to stand with mod assistance from PT to // bars. Pt had limited walking endurance with RW and required cuing to increase L step length, and hip flexion. 1st walk of 20 ft followed by rest break. 2nd walk of 17 ft pt knee buckled and was safely lowered to w/c by PT.  Pt has remaining deficits related to decreased coordination, abnormal gait, decreased endurance, decreased UE and LE strength, and decreased safety awareness. These deficits continue to limit the patients ability to perform functional transfers, perform ADLs, and ambulate household or community distances independently. Pt will continue to benefit from skilled physical therapy intervention 1-2x a week for 4 weeks to address remaining limitations and maximize functional independence.    OBJECTIVE IMPAIRMENTS: Abnormal gait, decreased activity tolerance, decreased  balance, decreased coordination, decreased endurance, decreased mobility, difficulty walking, decreased ROM, decreased strength, decreased safety awareness, impaired flexibility, and impaired UE functional use.   ACTIVITY LIMITATIONS: carrying, lifting, bending, standing, squatting, stairs, transfers, bathing, toileting, dressing, self feeding, reach over head, hygiene/grooming, and locomotion level  PARTICIPATION LIMITATIONS: meal prep, cleaning, laundry, driving, shopping, community activity, and yard work  PERSONAL FACTORS: Age, Financial Risk Analyst, Transportation,  and 3+ comorbidities: T2DM, A-fib, HTN, Stage III CKD are also affecting patient's functional outcome.   REHAB POTENTIAL: Good  CLINICAL DECISION MAKING: Evolving/moderate complexity  EVALUATION COMPLEXITY: High  PLAN:  PT FREQUENCY: 1-2x/week  PT DURATION: 4 weeks  PLANNED INTERVENTIONS: 97164- PT Re-evaluation, 97110-Therapeutic exercises, 97530- Therapeutic activity, 97112- Neuromuscular re-education, 97535- Self Care, [id number]- Manual therapy, Patient/Family education, DME instructions, and Wheelchair mobility training  PLAN FOR NEXT SESSION: Continue transfer training with focus on controlled descent and UE pushoff and weight shifting anteriorly. Improve general endurance capacity/tolerance to activity.  Ambulate with RW (goal of 50 feet and consistent L LE step through).      Ozell JAYSON Sero, PT, DPT # [phone] 08/24/2024, 11:14 AM "

## 2024-08-29 ENCOUNTER — Ambulatory Visit: Admitting: Physical Therapy

## 2024-08-29 ENCOUNTER — Encounter: Payer: Self-pay | Admitting: Family Medicine

## 2024-08-29 ENCOUNTER — Encounter: Payer: Self-pay | Admitting: Physical Therapy

## 2024-08-29 ENCOUNTER — Ambulatory Visit: Admitting: Family Medicine

## 2024-08-29 ENCOUNTER — Telehealth: Payer: Self-pay | Admitting: Family Medicine

## 2024-08-29 VITALS — BP 110/90 | HR 97 | Ht 74.0 in | Wt 184.0 lb

## 2024-08-29 DIAGNOSIS — N1832 Chronic kidney disease, stage 3b: Secondary | ICD-10-CM | POA: Diagnosis not present

## 2024-08-29 DIAGNOSIS — Z7984 Long term (current) use of oral hypoglycemic drugs: Secondary | ICD-10-CM | POA: Diagnosis not present

## 2024-08-29 DIAGNOSIS — I639 Cerebral infarction, unspecified: Secondary | ICD-10-CM

## 2024-08-29 DIAGNOSIS — I63531 Cerebral infarction due to unspecified occlusion or stenosis of right posterior cerebral artery: Secondary | ICD-10-CM

## 2024-08-29 DIAGNOSIS — R58 Hemorrhage, not elsewhere classified: Secondary | ICD-10-CM | POA: Diagnosis not present

## 2024-08-29 DIAGNOSIS — L853 Xerosis cutis: Secondary | ICD-10-CM

## 2024-08-29 DIAGNOSIS — Z7409 Other reduced mobility: Secondary | ICD-10-CM

## 2024-08-29 DIAGNOSIS — L608 Other nail disorders: Secondary | ICD-10-CM | POA: Diagnosis not present

## 2024-08-29 DIAGNOSIS — I69392 Facial weakness following cerebral infarction: Secondary | ICD-10-CM | POA: Diagnosis not present

## 2024-08-29 DIAGNOSIS — Z7901 Long term (current) use of anticoagulants: Secondary | ICD-10-CM | POA: Diagnosis not present

## 2024-08-29 DIAGNOSIS — E118 Type 2 diabetes mellitus with unspecified complications: Secondary | ICD-10-CM

## 2024-08-29 DIAGNOSIS — Z23 Encounter for immunization: Secondary | ICD-10-CM

## 2024-08-29 DIAGNOSIS — R2689 Other abnormalities of gait and mobility: Secondary | ICD-10-CM

## 2024-08-29 DIAGNOSIS — M6281 Muscle weakness (generalized): Secondary | ICD-10-CM | POA: Diagnosis not present

## 2024-08-29 DIAGNOSIS — R6889 Other general symptoms and signs: Secondary | ICD-10-CM

## 2024-08-29 DIAGNOSIS — I480 Paroxysmal atrial fibrillation: Secondary | ICD-10-CM

## 2024-08-29 LAB — POCT GLYCOSYLATED HEMOGLOBIN (HGB A1C): Hemoglobin A1C: 5.3 % (ref 4.0–5.6)

## 2024-08-29 MED ORDER — GLIPIZIDE ER 2.5 MG PO TB24: 2.5000 mg | ORAL_TABLET | Freq: Every day | ORAL | 0 refills | Status: AC

## 2024-08-29 MED ORDER — AMMONIUM LACTATE 5 % EX LOTN: 1.0000 | TOPICAL_LOTION | Freq: Two times a day (BID) | CUTANEOUS | 1 refills | Status: AC

## 2024-08-29 NOTE — Telephone Encounter (Signed)
 A referral was placed at pts appt.  KP

## 2024-08-29 NOTE — Therapy (Signed)
 "  OUTPATIENT PHYSICAL THERAPY NEURO TREATMENT  Patient Name: Corey Howard MRN: [id number] DOB:1951-01-12, 74 y.o., male Today's Date: 08/30/2024  REFERRING PROVIDER: Justus Leita DEL, MD  END OF SESSION:  PT End of Session - 08/29/24 1024     Visit Number 22    Number of Visits 28    Date for Recertification  09/19/24    PT Start Time 1024    PT Stop Time 1130    PT Time Calculation (min) 66 min    Equipment Utilized During Treatment Gait belt    Activity Tolerance Patient tolerated treatment well;Patient limited by fatigue;No increased pain    Behavior During Therapy WFL for tasks assessed/performed         Past Medical History:  Diagnosis Date   Diabetes mellitus without complication (HCC)    DVT (deep venous thrombosis) (HCC)    History of DVT (deep vein thrombosis)    Hyperlipidemia    Past Surgical History:  Procedure Laterality Date   TONSILLECTOMY     Patient Active Problem List   Diagnosis Date Noted   Ambulatory dysfunction 01/27/2024   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) 01/27/2024   Paroxysmal atrial fibrillation (HCC) 01/27/2024   Carotid artery stenosis 01/27/2024   Aortic atherosclerosis 01/25/2024   Facial weakness following cerebrovascular accident (CVA) 11/02/2023   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 11/02/2023   Oropharyngeal dysphagia 11/02/2023   Long term current use of anticoagulant therapy 11/02/2023   History of ischemic stroke 10/25/2023   Acquired thrombophilia 06/08/2023   Antiphospholipid antibody syndrome 03/04/2023   Neuropathy 03/04/2023   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   History of Bell's palsy    Tobacco abuse 01/27/2017   Seborrhea 10/01/2015   Type II diabetes mellitus with complication (HCC) 03/16/2015   Essential hypertension 03/16/2015   ONSET DATE: March 13th, 2025 (date of CVA)  REFERRING DIAG: Z28.73 (ICD-10-CM) - History of ischemic  stroke   THERAPY DIAG:  Muscle weakness (generalized)  Other abnormalities of gait and mobility  Unable to perform basic transfers independently  Cerebrovascular accident (CVA) due to occlusion of right posterior cerebral artery (HCC)  Decreased independence with transfers  Rationale for Evaluation and Treatment: Rehabilitation  SUBJECTIVE:                                                                                                                                                                                             SUBJECTIVE STATEMENT: Pt is a 74 year old male presenting to physical therapy with mobility concerns s/p ischemic (multifocal, R occipital) stroke  on March 13th, 2025. Pt reports left sided strength deficits affecting his mobility. Pt currently using a manual WC to get around and relies on his friend/roommate/tenant and a caretaker (who comes in the morning and at night) for assistance with transfers, feeding, dressing, and standing. Pt would like to increase his strength so that he can return to walking, which he states that he was able to do with use of SPC prior to stroke.   PERTINENT HISTORY: Pt had physical therapy for one month prior but came to clinic because of access to // bars with goals of walking. Pt has previous history of DVT, stage III CKD, type II DM, and HTN.   PAIN:  Are you having pain? No  PRECAUTIONS: None  RED FLAGS: None   WEIGHT BEARING RESTRICTIONS: No  FALLS: Has patient fallen in last 6 months? No  LIVING ENVIRONMENT: Lives with: his friend/tenant  Lives in: House/apartment Stairs: Yes: External: 2 steps; can reach both Has following equipment at home: Single point cane, Walker - 2 wheeled, Wheelchair (manual), and bed side commode  PLOF: Independent with basic ADLs, Independent with household mobility with device, and Independent with community mobility without device  PATIENT GOALS: To increase LE strength in order to be able  to walk with SPC  OBJECTIVE:  Note: Objective measures were completed at Evaluation unless otherwise noted.  DIAGNOSTIC FINDINGS: N/A  COGNITION: Overall cognitive status: Within functional limits for tasks assessed   SENSATION: Light touch: WFL (upper and lower dermatomes WNL)  COORDINATION: Finger to nose: impaired  Heel to shin: WFL (LLE limited by strength)  Rapid alternating movements: Alternating heel taps: WFL Alternating hand turns: WFL (limited by LUE strength)  MUSCLE LENGTH: Knee flexion contractures noted bilaterally   POSTURE: rounded shoulders, forward head, increased thoracic kyphosis, and weight shift right  UPPER EXTREMITY ROM:     Active  Right Eval Left Eval  Shoulder abduction 114 deg 78 deg   LOWER EXTREMITY MMT:    MMT Right Eval Left Eval  Hip flexion 4- 3  Hip extension    Hip abduction 4- 3  Hip adduction 4- 4-  Hip internal rotation    Hip external rotation    Knee flexion 3+ 3+  Knee extension 4 3  Ankle dorsiflexion 4- 3+  Ankle plantarflexion    Ankle inversion    Ankle eversion    (Blank rows = not tested)  UPPER EXTREMITY MMT: MMT Right eval Left eval  Shoulder flexion 4 3  Shoulder extension    Shoulder abduction 4- 3  Shoulder adduction    Shoulder extension    Shoulder internal rotation 4 3  Shoulder external rotation 4 3  Middle trapezius    Lower trapezius    Elbow flexion 4 3+  Elbow extension 4- 3  Wrist flexion    Wrist extension    Wrist ulnar deviation    Wrist radial deviation    Wrist pronation 4+ 4-  Wrist supination 4+ 4-  Grip strength 38.5# 47.2#   (Blank rows = not tested)   BED MOBILITY:  Findings: Sit to supine Complete Independence Supine to sit CGA  TRANSFERS: Sit to stand: Mod A  Assistive device utilized: Wheelchair (manual)     Stand to sit: CGA  Assistive device utilized: Grab bars      GAIT: Findings: Gait Characteristics: step to pattern, decreased step length- Left,  decreased stance time- Left, decreased stride length, decreased hip/knee flexion- Left, decreased ankle dorsiflexion- Left, trunk  flexed, narrow BOS, and poor foot clearance- Left, Distance walked: 40 ft in // bars, Assistive device utilized:// bars, Level of assistance: Min A, and Comments: Required verbal and tactile cueing keep trunk upright and prevent LLE from adducting past midline  FUNCTIONAL TESTS:  Will assess next session  PATIENT SURVEYS:  Will assess next session  05/09/24 Left Knee extension: -2 deg actively, 0 deg passively Right Knee extension: -9 deg actively, -1 deg passively Left hip flexion AROM: 117 deg Right hip flexion AROM: 113 deg Left shoulder flexion AROM: 130 deg Right shoulder flexion AROM: WNL Left shoulder abduction AROM: 117 deg Right shoulder abduction AROM: WNL  06/03/2024 MMT Right Left  Shoulder flexion 4 4-  Shoulder extension    Shoulder abduction 4- 4-  Shoulder adduction    Shoulder extension    Shoulder internal rotation 4 3+  Shoulder external rotation 4 4-   Active shoulder flexion of left shoulder measured at 135 degrees                                                                                                                    TREATMENT DATE: 08/30/2024  Subjective:    Pt reports no pain coming in today and no falls since last session. Pt is feeling good. Pt was transferred to w/c with assistance from caregiver. Pt L arm looked better today with less scabs and Band-Aids. Pt is ready to increase strength and standing tolerance/ walking stamina.    Nustep B UE/LE for 10 min at L4 for conditioning.    Therapeutic Exercise:    Seated Nautilus: Lat pull down 40# 2x15.  Seated Nautilus: triceps push down 40# 2x15.  Reviewed HEP  Therapeutic Activity:    Sit to stand to //-bars from w/c with mod assist B UE to pull up on // bars. Standing weight shifting to left and right.   2 laps walking with B UE with CGA. Cuing to increase  L hip flexion/ step length. (1st attempt pt needed rest break after about 5 steps)   STS from w/c with focus on leaning forward to get up to RW with proper technique. Pt was cued to lean forward and push off arm rests with hands from w/c and required mod assistance to get up to RW. Good control with return to sitting in w/c and reaching back with R UE to arm rest.  Cuing to increase L hip flexion/ step length. Pt had posterior pushing/ leaning with STS from w/c. Many attempts were made to get up RW but was not successful due to fatigue/ significant posterior lean/ control of L hand and transferring hand to RW.  W/c to car (passenger side) transfer with mod A x 1 for safety.  Pt continues to pull up on car door with window open.  Slight improvement with standing tolerance/ knee extension requiring less assist from PT.  Pt able to get B LE in car once sitting on passenger chair.      PATIENT EDUCATION: Education  details: Pt educated on diagnosis, prognosis, HEP, and POC Person educated: Patient Education method: Explanation, Demonstration, Tactile cues, and Verbal cues Education comprehension: verbalized understanding and returned demonstration  HOME EXERCISE PROGRAM: Access Code: [email or web address] URL: [email or web address] Date: 05/13/2024 Prepared by: Ozell Sero Exercises - Seated March - 2 x daily - 7 x weekly - 2 sets - 15 reps - Seated Long Arc Quad - 2 x daily - 7 x weekly - 2 sets - 15 reps - Seated Heel Raise - 2 x daily - 7 x weekly - 2 sets - 15 reps - Seated Elbow Extension with Self-Anchored Resistance - 2 x daily - 7 x weekly - 2 sets - 15 reps - Sit to Stand with Armchair - 1 x daily - 7 x weekly - 3 sets - 10 reps   GOALS: Goals reviewed with patient? Yes  LONG TERM GOALS: Target date: 09/19/2024  Pt to be able to ambulate household distances (at least 300 ft) with use of RW and no more than CGA from therapist so that he can become independent with daily ADLs.  Baseline:  nonambulatory with use of manual WC.  12/1: ambulates short distances with RW and CGA/min. A.  1/12: amb. 33 feet with RW Goal status: Not met  2.  Pt to improve gross LLE strength to at least a 3+/5 in order to assist with forward ambulation in // bars or with RW so pt can become more independent with daily ADLs.  Baseline: see above.  12/1: see above.  1/12: see above Goal status: Partially met  3.  Pt will require no greater than CGA with sit to stand transfers so that he is able to safely sit to stand at home with less assistance from his caretaker.  Baseline: mod A.  12/1: mod. A (able to stand from w/c into //-bars with CGA but limited standing into RW).  Goal status: Not met  ASSESSMENT:  CLINICAL IMPRESSION: Pt continues to have difficulty standing to w/c without pulling up from //-bars.  Pts. first attempt of walking in // bars needed to have a rest after only a few steps. Pulse Ox and HR were taken and they were 98 and 86 respectively.  Pt was able to walk 2 laps in // bars after a good rest break. PT had goal to have pt stand to walker. Pt had posterior lean and was apprehensive about standing up to walker. After many attempts of standing pt was unable to stand to walker due to fatigue or being unable to grab with L hand. Pt was able to 10 min of nustep after being transferred from w/c with help of PT. Pt still presents with weakness and lack of control in L UE and LE. Pt has remaining deficits related to decreased coordination, abnormal gait, decreased endurance, decreased UE and LE strength, and decreased safety awareness. These deficits continue to limit the patients ability to perform functional transfers, perform ADLs, and ambulate household or community distances independently. Pt will continue to benefit from skilled physical therapy intervention 1-2x a week for 4 weeks to address remaining limitations and maximize functional independence.     OBJECTIVE IMPAIRMENTS: Abnormal gait,  decreased activity tolerance, decreased balance, decreased coordination, decreased endurance, decreased mobility, difficulty walking, decreased ROM, decreased strength, decreased safety awareness, impaired flexibility, and impaired UE functional use.   ACTIVITY LIMITATIONS: carrying, lifting, bending, standing, squatting, stairs, transfers, bathing, toileting, dressing, self feeding, reach over head, hygiene/grooming, and locomotion level  PARTICIPATION LIMITATIONS: meal prep,  cleaning, laundry, driving, shopping, community activity, and yard work  PERSONAL FACTORS: Age, Fitness, Transportation, and 3+ comorbidities: T2DM, A-fib, HTN, Stage III CKD are also affecting patient's functional outcome.   REHAB POTENTIAL: Good  CLINICAL DECISION MAKING: Evolving/moderate complexity  EVALUATION COMPLEXITY: High  PLAN:  PT FREQUENCY: 1-2x/week  PT DURATION: 4 weeks  PLANNED INTERVENTIONS: 97164- PT Re-evaluation, 97110-Therapeutic exercises, 97530- Therapeutic activity, 97112- Neuromuscular re-education, 97535- Self Care, 02859- Manual therapy, Patient/Family education, DME instructions, and Wheelchair mobility training  PLAN FOR NEXT SESSION: Continue transfer training with focus on controlled descent and UE pushoff and weight shifting anteriorly. Improve general endurance capacity/tolerance to activity.  Ambulate with RW (goal of 50 feet and consistent L LE step through).      Ozell JAYSON Sero, PT, DPT # [id number] [place] Gainer, SPT 08/30/2024, 8:55 AM "

## 2024-08-29 NOTE — Progress Notes (Signed)
 "  Established Patient Office Visit  Patient ID: Corey Howard, male    DOB: 1951/03/10  Age: 74 y.o. MRN: [id number] PCP: Charmagne Buhl K, MD  Chief Complaint  Patient presents with   tranfer of care    DM, HTN    Subjective:     HPI  Discussed the use of AI scribe software for clinical note transcription with the patient, who gave verbal consent to proceed.  History of Present Illness Corey Howard is a 74 year old male with a history of stroke and antiphospholipid antibody syndrome who presents for evaluation of his left hand and medication management.  He experienced a stroke one year ago, affecting his left side and resulting in weakness and limited control of his left hand and leg. He describes his left hand as 'a little jumpy' and lacking control. He uses a walker during physical therapy sessions but requires assistance for transfers and daily activities at home. No recent falls, and he is cautious with movements to prevent them.  He reports an incident where he sat on his left hand, causing discomfort and bruising. The hand has been sore for about two weeks, with initial swelling and redness that has since improved. He applies Neosporin and keeps the area clean and covered. There is concern about potential infection, but no significant discharge has been observed recently.  He is on Eliquis  for antiphospholipid antibody syndrome. He faces challenges with the cost of Eliquis , as it is expensive until his insurance deductible is met. He is also on glipizide  for diabetes management, with a recent A1c of 5.7, indicating good control.  He receives physical therapy outside the home and is making progress in strength and mobility. However, he lacks occupational therapy at home, which is needed to assist with daily living activities and further rehabilitation. He is working towards being able to walk with a cane or walker independently.  He has a history of antiphospholipid antibody  syndrome, which is managed with Eliquis . He also sees a kidney specialist and has a history of seeing a cancer doctor for blood-related issues, though the specifics are unclear. He has not seen a neurologist recently.  No issues with speech or swallowing. No recent falls and no significant issues with eating or drinking.    Review of Systems  All other systems reviewed and are negative.     Objective:     BP (!) 110/90   Pulse 97   Ht 6' 2 (1.88 m)   Wt 184 lb (83.5 kg) Comment: per patient  SpO2 98%   BMI 23.62 kg/m     Physical Exam Vitals and nursing note reviewed.  Constitutional:      Appearance: Normal appearance.  HENT:     Head: Normocephalic.     Right Ear: External ear normal.     Left Ear: External ear normal.  Eyes:     Conjunctiva/sclera: Conjunctivae normal.  Cardiovascular:     Rate and Rhythm: Normal rate.  Pulmonary:     Effort: Pulmonary effort is normal. No respiratory distress.  Abdominal:     Palpations: Abdomen is soft.  Musculoskeletal:        General: Normal range of motion.  Skin:    General: Skin is warm.  Neurological:     Mental Status: He is alert and oriented to person, place, and time.  Psychiatric:        Mood and Affect: Mood normal.     Physical Exam NEUROLOGICAL: Skin  normal. Pulse and blood flow normal.   Results for orders placed or performed in visit on 08/29/24  POCT HgB A1C  Result Value Ref Range   Hemoglobin A1C 5.3 4.0 - 5.6 %   HbA1c POC (<> result, manual entry)     HbA1c, POC (prediabetic range)     HbA1c, POC (controlled diabetic range)         The ASCVD Risk score (Arnett DK, et al., 2019) failed to calculate for the following reasons:   Risk score cannot be calculated because patient has a medical history suggesting prior/existing ASCVD   * - Cholesterol units were assumed    Assessment & Plan:   Problem List Items Addressed This Visit       Cardiovascular and Mediastinum   Paroxysmal atrial  fibrillation (HCC) (Chronic)     Endocrine   Type II diabetes mellitus with complication (HCC) - Primary (Chronic)   Relevant Medications   glipiZIDE  (GLUCOTROL  XL) 2.5 MG 24 hr tablet     Musculoskeletal and Integument   Facial weakness following cerebrovascular accident (CVA)   Relevant Orders   Ambulatory referral to Occupational Therapy     Genitourinary   Stage 3b chronic kidney disease (CKD) (HCC) (Chronic)   Relevant Orders   Comprehensive Metabolic Panel (CMET)   CBC with Differential     Other   Long term current use of anticoagulant therapy   Relevant Orders   AMB Referral VBCI Care Management   Other Visit Diagnoses       Need for influenza vaccination       Relevant Orders   Flu vaccine HIGH DOSE PF(Fluzone Trivalent) (Completed)     Cerebrovascular accident (CVA), unspecified mechanism (HCC)       Relevant Orders   Ambulatory referral to Occupational Therapy   AMB Referral VBCI Care Management     Toenail deformity       Relevant Orders   Ambulatory referral to Podiatry     Dry skin dermatitis       Relevant Medications   ammonium lactate  (LAC-HYDRIN ) 5 % LOTN lotion     Type 2 diabetes mellitus with unspecified complications (HCC)       Relevant Medications   glipiZIDE  (GLUCOTROL  XL) 2.5 MG 24 hr tablet   Other Relevant Orders   POCT HgB A1C (Completed)     Ecchymosis of forearm           Assessment and Plan Assessment & Plan Hand contusion with skin breakdown Improved swelling, redness, and oozing. No infection or significant swelling. Skin color changes expected to resolve. - Continue Neosporin application. - Monitor for infection signs. - Educated on expected color changes.  Cerebral infarction with residual left-sided weakness and facial weakness Residual weakness post-cerebral infarction. No recent falls. Physical therapy shows some improvement. Occupational therapy needed for daily activities. - Ordered home occupational therapy. -  Continue physical therapy.  Type 2 diabetes mellitus with complication A1c at 5.7, indicating good control. No immediate need for further testing. - Continue current diabetes management. - per pt request we rechecked A1C.  Paroxysmal atrial fibrillation Managed with Eliquis . Cost concern, deductible expected to be met. Potential manufacturer coupon available. - Provided month supply of Eliquis . - Investigate manufacturer coupon. - Coordinate with cancer doctor for samples or pricing options.  Dry skin Requires management to prevent complications. - Prescribed cream for management.    No follow-ups on file.    Shaili Donalson K Margie Urbanowicz, MD [facility]  Medicine at [facility]   "

## 2024-08-29 NOTE — Telephone Encounter (Signed)
 Patient care taker request to have referral for a podiatrist to have toe nails cut.

## 2024-08-30 ENCOUNTER — Ambulatory Visit: Payer: Self-pay | Admitting: Family Medicine

## 2024-08-30 ENCOUNTER — Telehealth: Payer: Self-pay

## 2024-08-30 LAB — CBC WITH DIFFERENTIAL/PLATELET
Basophils Absolute: 0 x10E3/uL (ref 0.0–0.2)
Basos: 0 %
EOS (ABSOLUTE): 0.2 x10E3/uL (ref 0.0–0.4)
Eos: 1 %
Hematocrit: 49.7 % (ref 37.5–51.0)
Hemoglobin: 16.4 g/dL (ref 13.0–17.7)
Immature Grans (Abs): 0.1 x10E3/uL (ref 0.0–0.1)
Immature Granulocytes: 1 %
Lymphocytes Absolute: 1.4 x10E3/uL (ref 0.7–3.1)
Lymphs: 11 %
MCH: 30.8 pg (ref 26.6–33.0)
MCHC: 33 g/dL (ref 31.5–35.7)
MCV: 93 fL (ref 79–97)
Monocytes Absolute: 1.2 x10E3/uL — ABNORMAL HIGH (ref 0.1–0.9)
Monocytes: 9 %
Neutrophils Absolute: 9.9 x10E3/uL — ABNORMAL HIGH (ref 1.4–7.0)
Neutrophils: 78 %
Platelets: 357 x10E3/uL (ref 150–450)
RBC: 5.32 x10E6/uL (ref 4.14–5.80)
RDW: 13.7 % (ref 11.6–15.4)
WBC: 12.7 x10E3/uL — ABNORMAL HIGH (ref 3.4–10.8)

## 2024-08-30 LAB — COMPREHENSIVE METABOLIC PANEL WITH GFR
ALT: 12 IU/L (ref 0–44)
AST: 14 IU/L (ref 0–40)
Albumin: 4.2 g/dL (ref 3.8–4.8)
Alkaline Phosphatase: 125 IU/L — ABNORMAL HIGH (ref 47–123)
BUN/Creatinine Ratio: 16 (ref 10–24)
BUN: 31 mg/dL — ABNORMAL HIGH (ref 8–27)
Bilirubin Total: 0.5 mg/dL (ref 0.0–1.2)
CO2: 22 mmol/L (ref 20–29)
Calcium: 9.8 mg/dL (ref 8.6–10.2)
Chloride: 102 mmol/L (ref 96–106)
Creatinine, Ser: 1.95 mg/dL — ABNORMAL HIGH (ref 0.76–1.27)
Globulin, Total: 2.7 g/dL (ref 1.5–4.5)
Glucose: 93 mg/dL (ref 70–99)
Potassium: 4.9 mmol/L (ref 3.5–5.2)
Sodium: 141 mmol/L (ref 134–144)
Total Protein: 6.9 g/dL (ref 6.0–8.5)
eGFR: 36 mL/min/1.73 — ABNORMAL LOW

## 2024-08-30 NOTE — Progress Notes (Signed)
" °  Your kidney function worsened slightly please make sure you hydrate well and avoid pain pills like Aleve, Advil, ibuprofen, motrin and other similar ones. Preferably take Tylenol if needed for pain.  Please follow up with Nephrology. "

## 2024-08-30 NOTE — Telephone Encounter (Signed)
 Copied from CRM #[id number]. Topic: General - Other >> Aug 30, 2024  4:24 PM Tonda B wrote: Reason for CRM: pt called in and I gave him his test result he was ok with everything

## 2024-08-31 ENCOUNTER — Encounter: Payer: Self-pay | Admitting: Physical Therapy

## 2024-08-31 ENCOUNTER — Telehealth: Payer: Self-pay

## 2024-08-31 ENCOUNTER — Ambulatory Visit: Admitting: Physical Therapy

## 2024-08-31 DIAGNOSIS — R6889 Other general symptoms and signs: Secondary | ICD-10-CM

## 2024-08-31 DIAGNOSIS — I63531 Cerebral infarction due to unspecified occlusion or stenosis of right posterior cerebral artery: Secondary | ICD-10-CM

## 2024-08-31 DIAGNOSIS — M6281 Muscle weakness (generalized): Secondary | ICD-10-CM | POA: Diagnosis not present

## 2024-08-31 DIAGNOSIS — R2689 Other abnormalities of gait and mobility: Secondary | ICD-10-CM

## 2024-08-31 DIAGNOSIS — Z7409 Other reduced mobility: Secondary | ICD-10-CM

## 2024-08-31 NOTE — Progress Notes (Signed)
 Care Guide Pharmacy Note  08/31/2024 Name: Corey Howard MRN: [id number] DOB: 11-27-1950  Referred By: Kotturi, Vinay K, MD Reason for referral: Complex Care Management (Outreach to schedule with Pharm d )   Corey Howard is a 74 y.o. year old male who is a primary care patient of Kotturi, Vinay K, MD.  Corey Howard was referred to the pharmacist for assistance related to: CVA  An unsuccessful telephone outreach was attempted today to contact the patient who was referred to the pharmacy team for assistance with medication assistance. Additional attempts will be made to contact the patient.  [name] , RMA     [facility]  [facility], [facility] Guide  Direct Dial: [phone]  Website: [email or web address]

## 2024-08-31 NOTE — Therapy (Signed)
 "  OUTPATIENT PHYSICAL THERAPY NEURO TREATMENT  Patient Name: Corey Howard MRN: [id number] DOB:May 31, 1951, 74 y.o., male Today's Date: 08/31/2024  REFERRING PROVIDER: Justus Leita DEL, MD  END OF SESSION:  PT End of Session - 08/31/24 1029     Visit Number 23    Number of Visits 28    Date for Recertification  09/19/24    PT Start Time 1025    Equipment Utilized During Treatment Gait belt    Activity Tolerance Patient tolerated treatment well;Patient limited by fatigue;No increased pain    Behavior During Therapy WFL for tasks assessed/performed         Past Medical History:  Diagnosis Date   Diabetes mellitus without complication (HCC)    DVT (deep venous thrombosis) (HCC)    History of DVT (deep vein thrombosis)    Hyperlipidemia    Past Surgical History:  Procedure Laterality Date   TONSILLECTOMY     Patient Active Problem List   Diagnosis Date Noted   Ambulatory dysfunction 01/27/2024   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) 01/27/2024   Paroxysmal atrial fibrillation (HCC) 01/27/2024   Carotid artery stenosis 01/27/2024   Aortic atherosclerosis 01/25/2024   Facial weakness following cerebrovascular accident (CVA) 11/02/2023   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 11/02/2023   Oropharyngeal dysphagia 11/02/2023   Long term current use of anticoagulant therapy 11/02/2023   History of ischemic stroke 10/25/2023   Acquired thrombophilia 06/08/2023   Antiphospholipid antibody syndrome 03/04/2023   Neuropathy 03/04/2023   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   History of Bell's palsy    Tobacco abuse 01/27/2017   Seborrhea 10/01/2015   Type II diabetes mellitus with complication (HCC) 03/16/2015   Essential hypertension 03/16/2015   ONSET DATE: March 13th, 2025 (date of CVA)  REFERRING DIAG: Z43.73 (ICD-10-CM) - History of ischemic stroke   THERAPY DIAG:  Muscle weakness  (generalized)  Other abnormalities of gait and mobility  Unable to perform basic transfers independently  Cerebrovascular accident (CVA) due to occlusion of right posterior cerebral artery (HCC)  Decreased independence with transfers  Rationale for Evaluation and Treatment: Rehabilitation  SUBJECTIVE:                                                                                                                                                                                             SUBJECTIVE STATEMENT: Pt is a 74 year old male presenting to physical therapy with mobility concerns s/p ischemic (multifocal, R occipital) stroke on March 13th, 2025. Pt reports left sided strength deficits affecting his mobility. Pt currently using  a manual WC to get around and relies on his friend/roommate/tenant and a caretaker (who comes in the morning and at night) for assistance with transfers, feeding, dressing, and standing. Pt would like to increase his strength so that he can return to walking, which he states that he was able to do with use of SPC prior to stroke.   PERTINENT HISTORY: Pt had physical therapy for one month prior but came to clinic because of access to // bars with goals of walking. Pt has previous history of DVT, stage III CKD, type II DM, and HTN.   PAIN:  Are you having pain? No  PRECAUTIONS: None  RED FLAGS: None   WEIGHT BEARING RESTRICTIONS: No  FALLS: Has patient fallen in last 6 months? No  LIVING ENVIRONMENT: Lives with: his friend/tenant  Lives in: House/apartment Stairs: Yes: External: 2 steps; can reach both Has following equipment at home: Single point cane, Walker - 2 wheeled, Wheelchair (manual), and bed side commode  PLOF: Independent with basic ADLs, Independent with household mobility with device, and Independent with community mobility without device  PATIENT GOALS: To increase LE strength in order to be able to walk with SPC  OBJECTIVE:  Note:  Objective measures were completed at Evaluation unless otherwise noted.  DIAGNOSTIC FINDINGS: N/A  COGNITION: Overall cognitive status: Within functional limits for tasks assessed   SENSATION: Light touch: WFL (upper and lower dermatomes WNL)  COORDINATION: Finger to nose: impaired  Heel to shin: WFL (LLE limited by strength)  Rapid alternating movements: Alternating heel taps: WFL Alternating hand turns: WFL (limited by LUE strength)  MUSCLE LENGTH: Knee flexion contractures noted bilaterally   POSTURE: rounded shoulders, forward head, increased thoracic kyphosis, and weight shift right  UPPER EXTREMITY ROM:     Active  Right Eval Left Eval  Shoulder abduction 114 deg 78 deg   LOWER EXTREMITY MMT:    MMT Right Eval Left Eval  Hip flexion 4- 3  Hip extension    Hip abduction 4- 3  Hip adduction 4- 4-  Hip internal rotation    Hip external rotation    Knee flexion 3+ 3+  Knee extension 4 3  Ankle dorsiflexion 4- 3+  Ankle plantarflexion    Ankle inversion    Ankle eversion    (Blank rows = not tested)  UPPER EXTREMITY MMT: MMT Right eval Left eval  Shoulder flexion 4 3  Shoulder extension    Shoulder abduction 4- 3  Shoulder adduction    Shoulder extension    Shoulder internal rotation 4 3  Shoulder external rotation 4 3  Middle trapezius    Lower trapezius    Elbow flexion 4 3+  Elbow extension 4- 3  Wrist flexion    Wrist extension    Wrist ulnar deviation    Wrist radial deviation    Wrist pronation 4+ 4-  Wrist supination 4+ 4-  Grip strength 38.5# 47.2#   (Blank rows = not tested)   BED MOBILITY:  Findings: Sit to supine Complete Independence Supine to sit CGA  TRANSFERS: Sit to stand: Mod A  Assistive device utilized: Wheelchair (manual)     Stand to sit: CGA  Assistive device utilized: Grab bars      GAIT: Findings: Gait Characteristics: step to pattern, decreased step length- Left, decreased stance time- Left, decreased stride  length, decreased hip/knee flexion- Left, decreased ankle dorsiflexion- Left, trunk flexed, narrow BOS, and poor foot clearance- Left, Distance walked: 40 ft in // bars, Assistive  device utilized:// bars, Level of assistance: Min A, and Comments: Required verbal and tactile cueing keep trunk upright and prevent LLE from adducting past midline  FUNCTIONAL TESTS:  Will assess next session  PATIENT SURVEYS:  Will assess next session  05/09/24 Left Knee extension: -2 deg actively, 0 deg passively Right Knee extension: -9 deg actively, -1 deg passively Left hip flexion AROM: 117 deg Right hip flexion AROM: 113 deg Left shoulder flexion AROM: 130 deg Right shoulder flexion AROM: WNL Left shoulder abduction AROM: 117 deg Right shoulder abduction AROM: WNL  06/03/2024 MMT Right Left  Shoulder flexion 4 4-  Shoulder extension    Shoulder abduction 4- 4-  Shoulder adduction    Shoulder extension    Shoulder internal rotation 4 3+  Shoulder external rotation 4 4-   Active shoulder flexion of left shoulder measured at 135 degrees                                                                                                                    TREATMENT DATE: 08/31/2024  Subjective:    Pt reports no pain coming in today and no falls since last session.  Pt is feeling good.  Pt was transferred to w/c with assistance from caregiver.   Pt is ready to increase strength and standing tolerance/ walking stamina.  Pt. Had f/u with PCP on Monday and no changes in meds.  MD was happy with pts. Progress since starting PT treatment.    Therapeutic Activity:    Sit to stand to //-bars from w/c with mod assist B UE to pull up on // bars. Standing weight shifting to left and right.  Standing marching with heavy B UE assist on //-bars 15x (L knee buckled requiring mod A from PT to control and pt. Returned to sitting in w/c).    2 laps walking with B UE with CGA. Cuing to increase L hip flexion/ step length.  (Challenged with maintaining L foot in midline).    STS from w/c with focus on leaning forward to get up to RW with proper technique.   Use of Airex pad under B heels to promote improved forward posture during STS into //-bars.  Pt was cued to lean forward and push off arm rests with hands from w/c and required mod assistance to get up to RW. Good control with return to sitting in w/c and reaching back with R UE to arm rest.  Cuing to increase L hip flexion/ step length. Pt had posterior pushing/ leaning with STS from w/c. Many attempts were made to get up from RW but was not successful due to fatigue/ significant posterior lean/ control of L hand and transferring hand to RW.  W/c to car (passenger side) transfer with mod A x 1 for safety.  Pt continues to pull up on car door with window open.  Slight improvement with standing tolerance/ knee extension requiring less assist from PT.  Pt able to get B LE in car once sitting  on passenger chair.   Therapeutic Exercise:    Nustep B UE/LE for 10 min at L4 for conditioning.  End of tx. Session to prevent LE fatigue prior to walking tasks.    Seated marching/ B shoulder flexion and chest press with wand 20x each.     PATIENT EDUCATION: Education details: Pt educated on diagnosis, prognosis, HEP, and POC Person educated: Patient Education method: Explanation, Demonstration, Tactile cues, and Verbal cues Education comprehension: verbalized understanding and returned demonstration  HOME EXERCISE PROGRAM: Access Code: [email or web address] URL: [email or web address] Date: 05/13/2024 Prepared by: Ozell Sero Exercises - Seated March - 2 x daily - 7 x weekly - 2 sets - 15 reps - Seated Long Arc Quad - 2 x daily - 7 x weekly - 2 sets - 15 reps - Seated Heel Raise - 2 x daily - 7 x weekly - 2 sets - 15 reps - Seated Elbow Extension with Self-Anchored Resistance - 2 x daily - 7 x weekly - 2 sets - 15 reps - Sit to Stand with Armchair - 1 x daily - 7 x weekly  - 3 sets - 10 reps   GOALS: Goals reviewed with patient? Yes  LONG TERM GOALS: Target date: 09/19/2024  Pt to be able to ambulate household distances (at least 300 ft) with use of RW and no more than CGA from therapist so that he can become independent with daily ADLs.  Baseline: nonambulatory with use of manual WC.  12/1: ambulates short distances with RW and CGA/min. A.  1/12: amb. 33 feet with RW Goal status: Not met  2.  Pt to improve gross LLE strength to at least a 3+/5 in order to assist with forward ambulation in // bars or with RW so pt can become more independent with daily ADLs.  Baseline: see above.  12/1: see above.  1/12: see above Goal status: Partially met  3.  Pt will require no greater than CGA with sit to stand transfers so that he is able to safely sit to stand at home with less assistance from his caretaker.  Baseline: mod A.  12/1: mod. A (able to stand from w/c into //-bars with CGA but limited standing into RW).  Goal status: Not met  ASSESSMENT:  CLINICAL IMPRESSION: Pt continues to have difficulty standing to w/c without pulling up from //-bars.  Pt. Able to transfer from standing in //-bars to RW and ambulate limited distances due to fatigue/ posterior lean.   Pt still presents with weakness and lack of control in L UE and LE.   Pt has remaining deficits related to decreased coordination, abnormal gait, decreased endurance, decreased UE and LE strength, and decreased safety awareness. These deficits continue to limit the patients ability to perform functional transfers, perform ADLs, and ambulate household or community distances independently. Pt will continue to benefit from skilled physical therapy intervention 1-2x a week for 4 weeks to address remaining limitations and maximize functional independence.     OBJECTIVE IMPAIRMENTS: Abnormal gait, decreased activity tolerance, decreased balance, decreased coordination, decreased endurance, decreased mobility,  difficulty walking, decreased ROM, decreased strength, decreased safety awareness, impaired flexibility, and impaired UE functional use.   ACTIVITY LIMITATIONS: carrying, lifting, bending, standing, squatting, stairs, transfers, bathing, toileting, dressing, self feeding, reach over head, hygiene/grooming, and locomotion level  PARTICIPATION LIMITATIONS: meal prep, cleaning, laundry, driving, shopping, community activity, and yard work  PERSONAL FACTORS: Age, Fitness, Transportation, and 3+ comorbidities: T2DM, A-fib, HTN, Stage III CKD are also affecting patient's functional outcome.  REHAB POTENTIAL: Good  CLINICAL DECISION MAKING: Evolving/moderate complexity  EVALUATION COMPLEXITY: High  PLAN:  PT FREQUENCY: 1-2x/week  PT DURATION: 4 weeks  PLANNED INTERVENTIONS: 97164- PT Re-evaluation, 97110-Therapeutic exercises, 97530- Therapeutic activity, 97112- Neuromuscular re-education, 97535- Self Care, 02859- Manual therapy, Patient/Family education, DME instructions, and Wheelchair mobility training  PLAN FOR NEXT SESSION: Continue transfer training with focus on controlled descent and UE pushoff and weight shifting anteriorly. Improve general endurance capacity/tolerance to activity.  Ambulate with RW (goal of 50 feet and consistent L LE step through).      Ozell JAYSON Sero, PT, DPT # [phone] 08/31/2024, 10:29 AM "

## 2024-09-05 ENCOUNTER — Ambulatory Visit: Admitting: Physical Therapy

## 2024-09-05 NOTE — Progress Notes (Unsigned)
 Care Guide Pharmacy Note  09/05/2024 Name: Corey Howard MRN: [id number] DOB: 1951/06/11  Referred By: Kotturi, Vinay K, MD Reason for referral: Complex Care Management (Outreach to schedule with Pharm d )   Corey Howard is a 74 y.o. year old male who is a primary care patient of Kotturi, Vinay K, MD.  Corey Howard was referred to the pharmacist for assistance related to: CVA  A second unsuccessful telephone outreach was attempted today to contact the patient who was referred to the pharmacy team for assistance with medication assistance. Additional attempts will be made to contact the patient.  [name] , RMA     [facility]  [facility], [facility] Guide  Direct Dial: [phone]  Website: [email or web address]

## 2024-09-06 NOTE — Progress Notes (Signed)
 Care Guide Pharmacy Note  09/06/2024 Name: Corey Howard MRN: [id number] DOB: Aug 16, 1950  Referred By: Kotturi, Vinay K, MD Reason for referral: Complex Care Management (Outreach to schedule with Pharm d )   Corey Howard is a 73 y.o. year old male who is a primary care patient of Kotturi, Vinay K, MD.  Corey Howard was referred to the pharmacist for assistance related to: Atrial Fibrillation  A third unsuccessful telephone outreach was attempted today to contact the patient who was referred to the pharmacy team for assistance with medication assistance. The Population Health team is pleased to engage with this patient at any time in the future upon receipt of referral and should he/she be interested in assistance from the [facility] team.  Corey Howard , RMA     [facility]  [facility], [facility] Guide  Direct Dial: [phone]  Website: [email or web address]

## 2024-09-07 ENCOUNTER — Ambulatory Visit: Admitting: Physical Therapy

## 2024-09-12 ENCOUNTER — Ambulatory Visit: Admitting: Physical Therapy

## 2024-09-14 ENCOUNTER — Ambulatory Visit: Attending: Internal Medicine | Admitting: Physical Therapy

## 2024-09-15 ENCOUNTER — Telehealth: Payer: Self-pay | Admitting: Family Medicine

## 2024-09-15 NOTE — Telephone Encounter (Unsigned)
 Copied from CRM [id number]. Topic: Clinical - Medical Advice >> Sep 15, 2024 12:20 PM Mercedes MATSU wrote: Reason for CRM: Selma from [facility] called in and stated that they are requesting verification of patient chronic condition of diabetes and cardiovascular disease, for insurance purposes. Selma from [facility] can be reached at [phone].   Reference #: [id number]

## 2024-09-16 NOTE — Telephone Encounter (Signed)
 Call the 1-800 number to have them send a fax over our fax number is [phone].  KP

## 2024-09-16 NOTE — Telephone Encounter (Signed)
 Good Morning  Could you please send a fax so we can review and sign off on it?

## 2024-09-19 ENCOUNTER — Ambulatory Visit: Admitting: Physical Therapy

## 2024-09-21 ENCOUNTER — Ambulatory Visit: Admitting: Physical Therapy

## 2024-09-26 ENCOUNTER — Ambulatory Visit: Admitting: Physical Therapy

## 2024-09-28 ENCOUNTER — Ambulatory Visit: Admitting: Physical Therapy

## 2024-10-03 ENCOUNTER — Ambulatory Visit: Admitting: Physical Therapy

## 2024-10-05 ENCOUNTER — Ambulatory Visit: Admitting: Physical Therapy

## 2024-10-14 ENCOUNTER — Other Ambulatory Visit

## 2024-12-26 ENCOUNTER — Ambulatory Visit: Admitting: Family Medicine

## 2025-04-14 ENCOUNTER — Other Ambulatory Visit

## 2025-04-14 ENCOUNTER — Ambulatory Visit: Admitting: Oncology
